# Patient Record
Sex: Male | Born: 1965 | Race: Black or African American | Hispanic: No | Marital: Single | Smoking: Never smoker
Health system: Southern US, Community
[De-identification: ages and names within clinical notes are randomized; demographics above are authoritative.]

## PROBLEM LIST (undated history)

## (undated) DIAGNOSIS — M5136 Other intervertebral disc degeneration, lumbar region: Secondary | ICD-10-CM

## (undated) DIAGNOSIS — I10 Essential (primary) hypertension: Secondary | ICD-10-CM

## (undated) DIAGNOSIS — Q613 Polycystic kidney, unspecified: Secondary | ICD-10-CM

## (undated) DIAGNOSIS — M51369 Other intervertebral disc degeneration, lumbar region without mention of lumbar back pain or lower extremity pain: Secondary | ICD-10-CM

## (undated) HISTORY — PX: NEPHRECTOMY TRANSPLANTED ORGAN: SUR880

---

## 1998-11-21 ENCOUNTER — Encounter: Payer: Self-pay | Admitting: Urology

## 1998-11-21 ENCOUNTER — Ambulatory Visit (HOSPITAL_COMMUNITY): Admission: RE | Admit: 1998-11-21 | Discharge: 1998-11-21 | Payer: Self-pay | Admitting: Urology

## 2000-06-17 ENCOUNTER — Emergency Department (HOSPITAL_COMMUNITY): Admission: EM | Admit: 2000-06-17 | Discharge: 2000-06-18 | Payer: Self-pay | Admitting: Emergency Medicine

## 2001-08-10 ENCOUNTER — Emergency Department (HOSPITAL_COMMUNITY): Admission: EM | Admit: 2001-08-10 | Discharge: 2001-08-11 | Payer: Self-pay | Admitting: *Deleted

## 2001-08-11 ENCOUNTER — Encounter: Payer: Self-pay | Admitting: *Deleted

## 2001-08-12 ENCOUNTER — Emergency Department (HOSPITAL_COMMUNITY): Admission: EM | Admit: 2001-08-12 | Discharge: 2001-08-12 | Payer: Self-pay | Admitting: Emergency Medicine

## 2001-08-14 ENCOUNTER — Encounter: Payer: Self-pay | Admitting: Emergency Medicine

## 2001-08-14 ENCOUNTER — Inpatient Hospital Stay (HOSPITAL_COMMUNITY): Admission: EM | Admit: 2001-08-14 | Discharge: 2001-08-18 | Payer: Self-pay | Admitting: Emergency Medicine

## 2001-08-15 ENCOUNTER — Encounter: Payer: Self-pay | Admitting: Cardiology

## 2001-08-15 ENCOUNTER — Encounter: Payer: Self-pay | Admitting: Family Medicine

## 2001-08-16 ENCOUNTER — Encounter: Payer: Self-pay | Admitting: Family Medicine

## 2001-08-18 ENCOUNTER — Encounter: Payer: Self-pay | Admitting: Family Medicine

## 2001-08-23 ENCOUNTER — Encounter: Admission: RE | Admit: 2001-08-23 | Discharge: 2001-08-23 | Payer: Self-pay | Admitting: Family Medicine

## 2001-08-23 ENCOUNTER — Encounter (HOSPITAL_COMMUNITY): Admission: RE | Admit: 2001-08-23 | Discharge: 2001-09-22 | Payer: Self-pay | Admitting: Nephrology

## 2002-01-10 ENCOUNTER — Encounter: Payer: Self-pay | Admitting: Vascular Surgery

## 2002-01-16 ENCOUNTER — Ambulatory Visit (HOSPITAL_COMMUNITY): Admission: RE | Admit: 2002-01-16 | Discharge: 2002-01-16 | Payer: Self-pay | Admitting: Vascular Surgery

## 2002-07-21 ENCOUNTER — Emergency Department (HOSPITAL_COMMUNITY): Admission: EM | Admit: 2002-07-21 | Discharge: 2002-07-21 | Payer: Self-pay | Admitting: Emergency Medicine

## 2002-07-21 ENCOUNTER — Encounter: Payer: Self-pay | Admitting: Emergency Medicine

## 2003-01-21 ENCOUNTER — Emergency Department (HOSPITAL_COMMUNITY): Admission: EM | Admit: 2003-01-21 | Discharge: 2003-01-21 | Payer: Self-pay | Admitting: Emergency Medicine

## 2003-06-13 ENCOUNTER — Ambulatory Visit (HOSPITAL_COMMUNITY): Admission: RE | Admit: 2003-06-13 | Discharge: 2003-06-13 | Payer: Self-pay | Admitting: Nephrology

## 2003-06-15 ENCOUNTER — Ambulatory Visit (HOSPITAL_COMMUNITY): Admission: RE | Admit: 2003-06-15 | Discharge: 2003-06-15 | Payer: Self-pay | Admitting: Nephrology

## 2003-09-08 ENCOUNTER — Inpatient Hospital Stay (HOSPITAL_COMMUNITY): Admission: EM | Admit: 2003-09-08 | Discharge: 2003-09-10 | Payer: Self-pay | Admitting: Emergency Medicine

## 2003-09-14 ENCOUNTER — Emergency Department (HOSPITAL_COMMUNITY): Admission: EM | Admit: 2003-09-14 | Discharge: 2003-09-15 | Payer: Self-pay | Admitting: *Deleted

## 2003-09-21 ENCOUNTER — Inpatient Hospital Stay (HOSPITAL_COMMUNITY): Admission: EM | Admit: 2003-09-21 | Discharge: 2003-09-24 | Payer: Self-pay | Admitting: Emergency Medicine

## 2003-09-24 ENCOUNTER — Encounter: Payer: Self-pay | Admitting: Internal Medicine

## 2003-10-04 ENCOUNTER — Ambulatory Visit (HOSPITAL_COMMUNITY): Admission: RE | Admit: 2003-10-04 | Discharge: 2003-10-04 | Payer: Self-pay | Admitting: Nephrology

## 2003-10-10 ENCOUNTER — Ambulatory Visit (HOSPITAL_COMMUNITY): Admission: RE | Admit: 2003-10-10 | Discharge: 2003-10-10 | Payer: Self-pay | Admitting: Nephrology

## 2003-10-13 ENCOUNTER — Ambulatory Visit (HOSPITAL_COMMUNITY): Admission: RE | Admit: 2003-10-13 | Discharge: 2003-10-13 | Payer: Self-pay | Admitting: Nephrology

## 2003-10-13 ENCOUNTER — Emergency Department (HOSPITAL_COMMUNITY): Admission: EM | Admit: 2003-10-13 | Discharge: 2003-10-14 | Payer: Self-pay | Admitting: Emergency Medicine

## 2004-01-28 ENCOUNTER — Ambulatory Visit (HOSPITAL_COMMUNITY): Admission: RE | Admit: 2004-01-28 | Discharge: 2004-01-28 | Payer: Self-pay | Admitting: Nephrology

## 2004-03-28 ENCOUNTER — Emergency Department (HOSPITAL_COMMUNITY): Admission: EM | Admit: 2004-03-28 | Discharge: 2004-03-28 | Payer: Self-pay | Admitting: Emergency Medicine

## 2004-03-29 ENCOUNTER — Emergency Department (HOSPITAL_COMMUNITY): Admission: EM | Admit: 2004-03-29 | Discharge: 2004-03-29 | Payer: Self-pay | Admitting: *Deleted

## 2004-04-04 ENCOUNTER — Ambulatory Visit (HOSPITAL_COMMUNITY): Admission: RE | Admit: 2004-04-04 | Discharge: 2004-04-04 | Payer: Self-pay | Admitting: Nephrology

## 2004-04-08 ENCOUNTER — Encounter (HOSPITAL_COMMUNITY): Admission: RE | Admit: 2004-04-08 | Discharge: 2004-05-08 | Payer: Self-pay | Admitting: Nephrology

## 2004-04-09 ENCOUNTER — Ambulatory Visit (HOSPITAL_COMMUNITY): Payer: Self-pay | Admitting: Pulmonary Disease

## 2004-06-18 ENCOUNTER — Ambulatory Visit (HOSPITAL_COMMUNITY): Admission: RE | Admit: 2004-06-18 | Discharge: 2004-06-18 | Payer: Self-pay | Admitting: Nephrology

## 2004-08-06 ENCOUNTER — Encounter: Admission: RE | Admit: 2004-08-06 | Discharge: 2004-08-06 | Payer: Self-pay | Admitting: Nephrology

## 2004-09-28 ENCOUNTER — Emergency Department (HOSPITAL_COMMUNITY): Admission: EM | Admit: 2004-09-28 | Discharge: 2004-09-28 | Payer: Self-pay | Admitting: Emergency Medicine

## 2004-10-01 ENCOUNTER — Ambulatory Visit (HOSPITAL_COMMUNITY): Admission: RE | Admit: 2004-10-01 | Discharge: 2004-10-01 | Payer: Self-pay | Admitting: Nephrology

## 2004-10-20 ENCOUNTER — Inpatient Hospital Stay (HOSPITAL_COMMUNITY): Admission: EM | Admit: 2004-10-20 | Discharge: 2004-10-29 | Payer: Self-pay | Admitting: Emergency Medicine

## 2005-01-12 ENCOUNTER — Ambulatory Visit (HOSPITAL_COMMUNITY): Admission: RE | Admit: 2005-01-12 | Discharge: 2005-01-12 | Payer: Self-pay | Admitting: Nephrology

## 2005-07-20 IMAGING — XA IR VENTRICULOPERITONEALSHUNT EVAL/INJ
1 series · 13 of 24 positions shown · non-contrast
Comparison: none

CLINICAL DATA: Increased venous pressures, poor clearance 

LEFT UPPER ARM AV SHUNTOGRAM:
Complications: None

[Series 1: run · 13 of 31 slices shown]
[im 1/31]
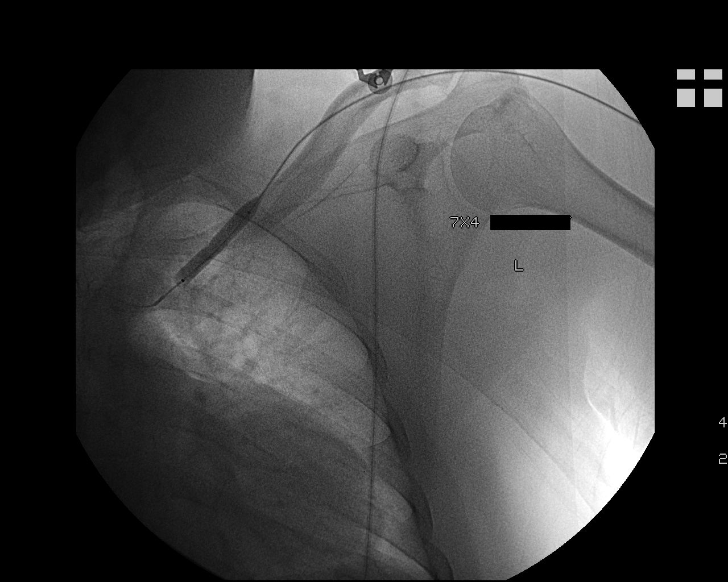
[im 3/31]
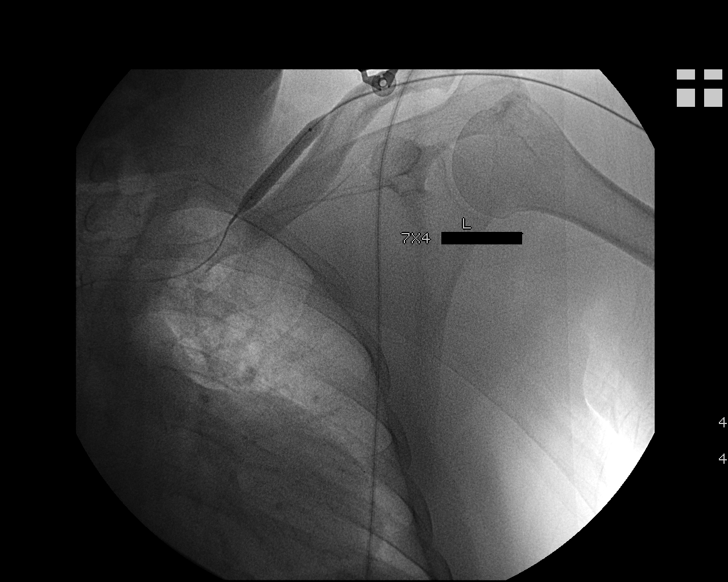
[im 6/31]
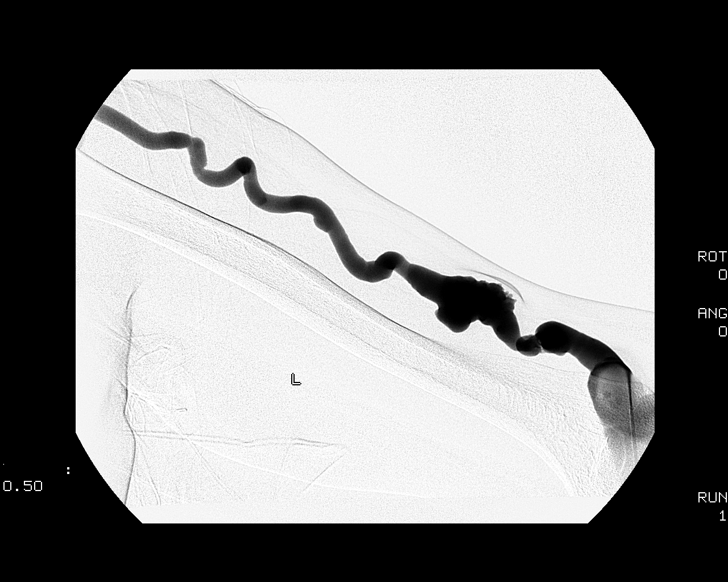
[im 8/31]
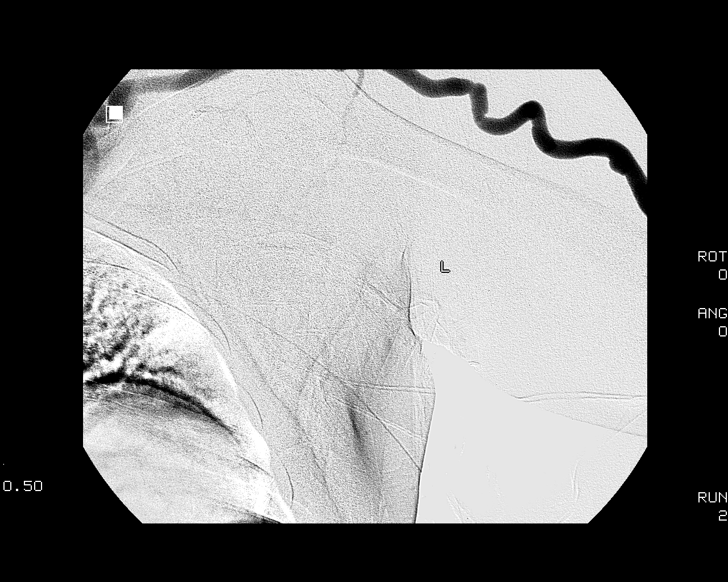
[im 11/31]
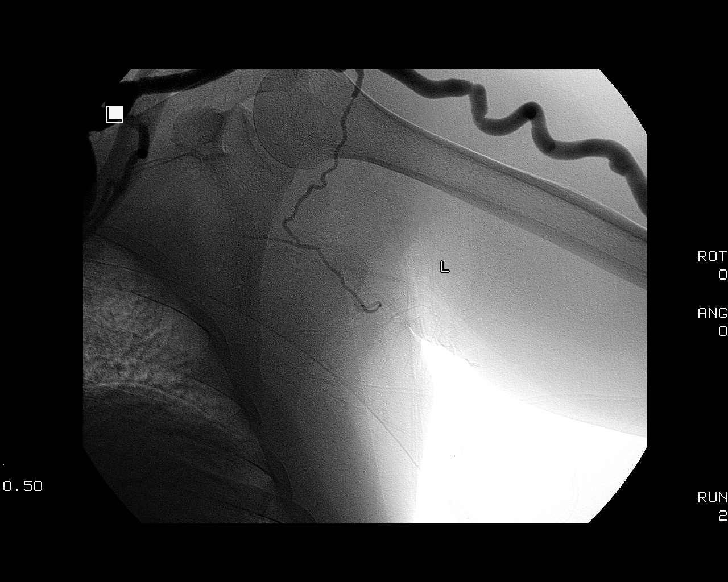
[im 14/31]
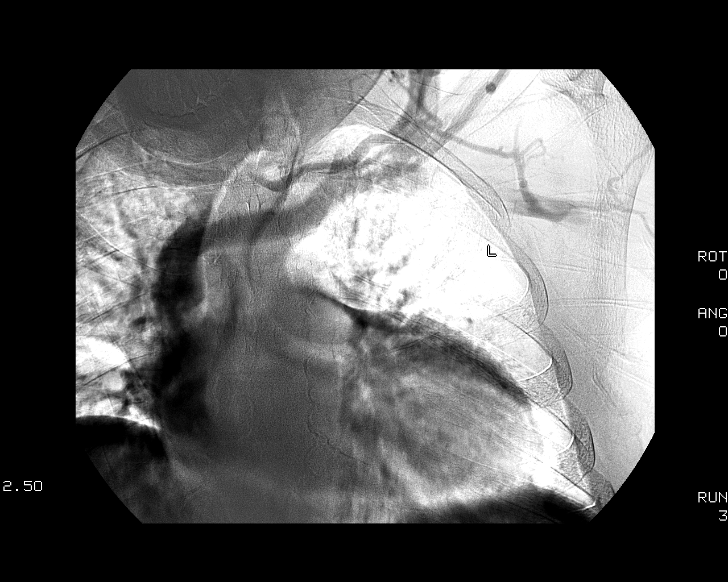
[im 16/31]
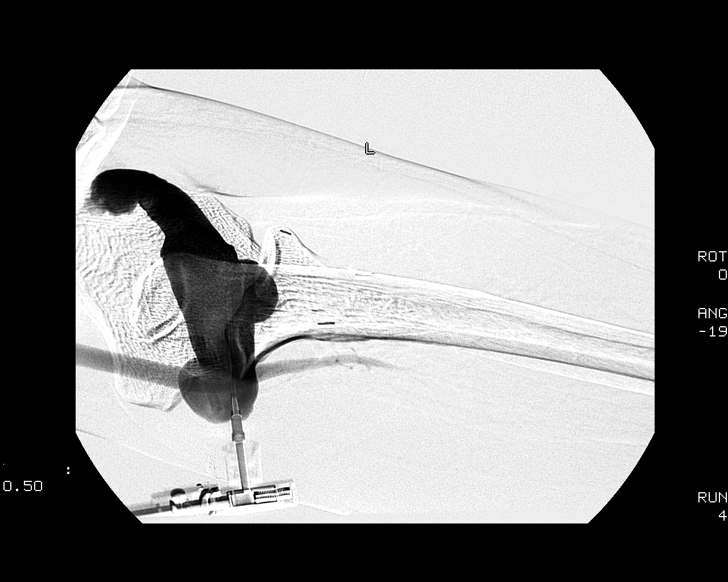
[im 17/31]
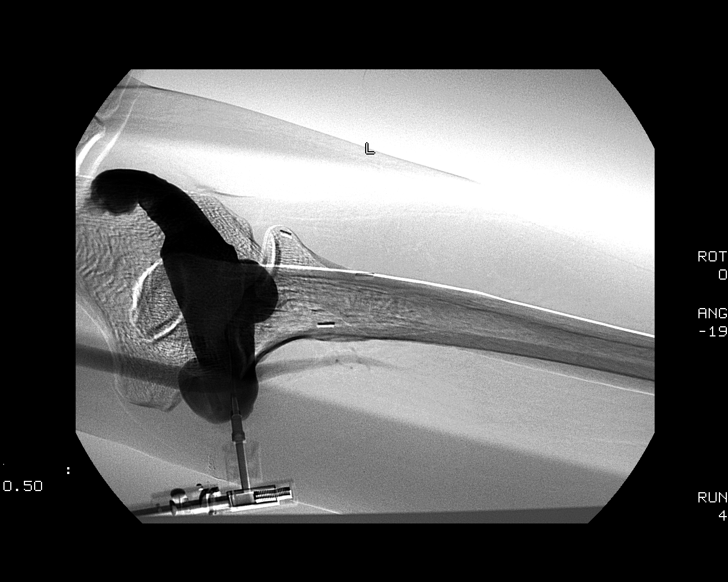
[im 20/31]
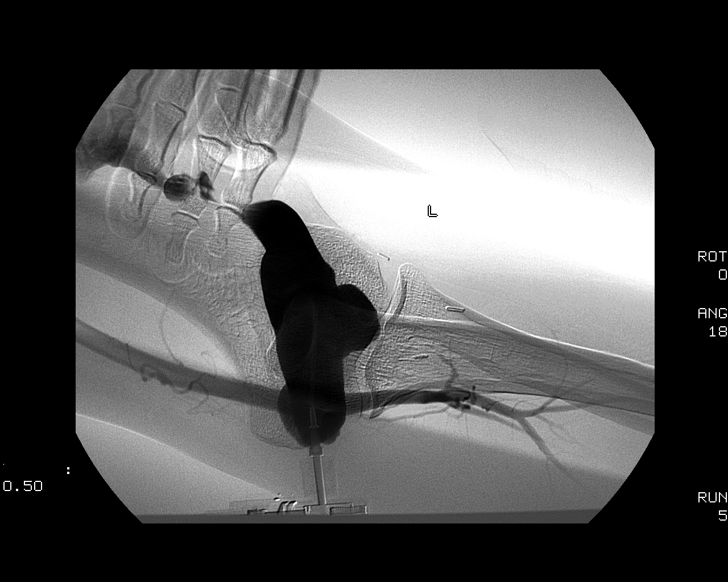
[im 23/31]
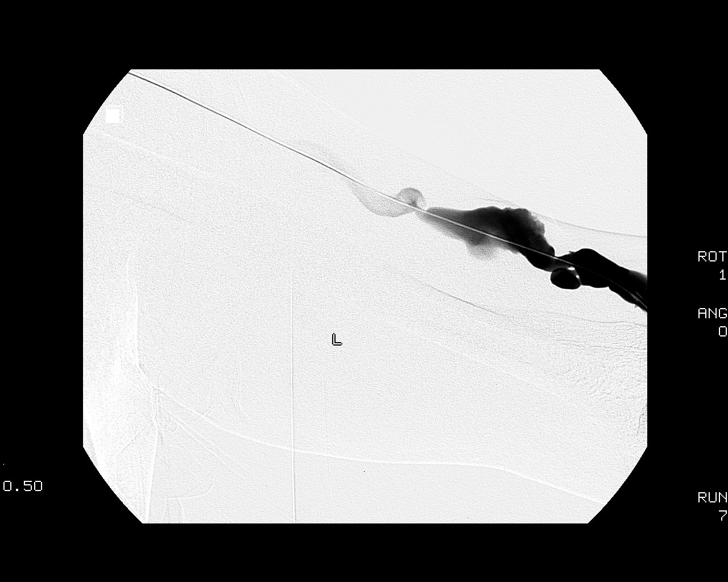
[im 25/31]
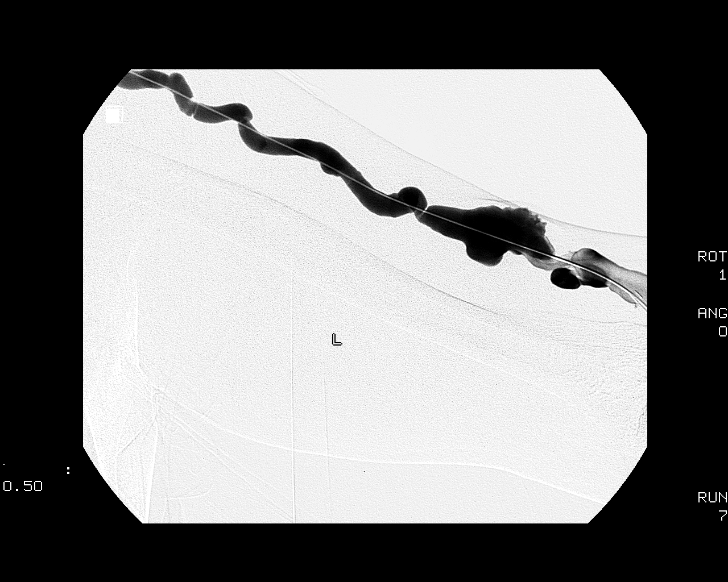
[im 28/31]
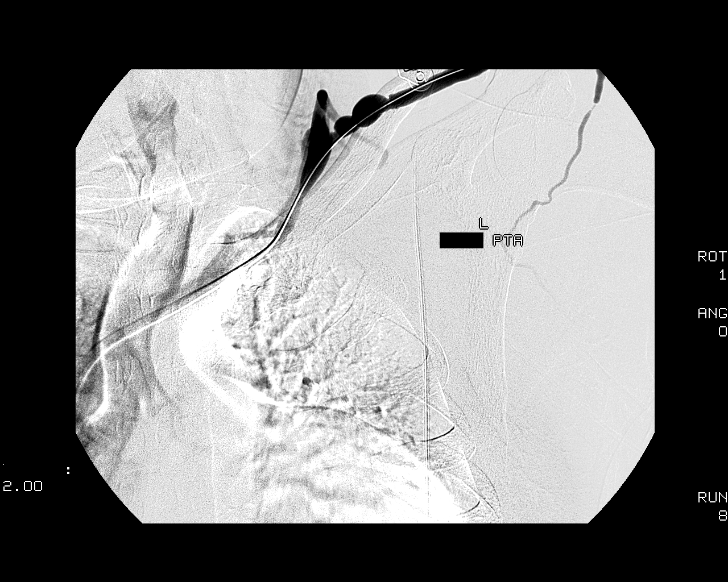
[im 31/31]
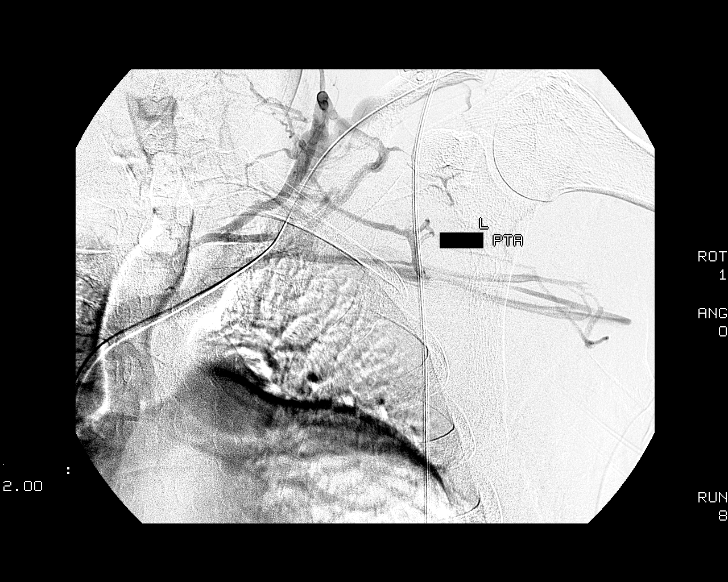

[13 of 24 positions shown; findings below may reference images not displayed]

FINDINGS: Written informed consent was obtained from the patient for the
procedure. The patient was placed supine on the angiography table and the left
upper arm was prepped and draped in sterile fashion. Access to the left upper
arm fistula was obtained with an 18 gauge angiocatheter. AV shuntogram was
performed. This shows aneurysmal segments of the proximal draining vein which is
the cephalic vein. Within the central left cephalic vein, there are 2 areas of
mild to moderate narrowing. The arterial anastomosis is patent.
IMPRESSION: 2 areas of moderate narrowing within the central left cephalic vein.

Large aneurysmal segments of the proximal draining vein/cephalic vein. 

VENOUS ANGIOPLASTY:
FINDINGS: The 18 gauge angiocath was exchanged over a guidewire a 6 French
sheath. The 2 areas of narrowing within the central left cephalic vein were
dilated with a 7 mm x 4 cm high pressure balloon. Repeat shuntogram shows good
response in these areas. In addition, there was difficulty passing a wire and
catheter through the proximal aspect of the fistula between the aneurysmal
segments. Therefore there was concern for a stenosis in this area. This was also
dilated with a 7 mm high pressure balloon. Repeat shuntogram shows good response
with no residual stenosis.
IMPRESSION: Successful balloon dilatation of the above venous stenosis with good response.

## 2006-02-13 IMAGING — XA IR VENTRICULOPERITONEALSHUNT EVAL/INJ
1 series · 15 of 24 positions shown · non-contrast
Comparison: 06/18/04.

CLINICAL DATA: History of endstage renal disease.  The patient is dialyzed through a native fistula of the left antecubital fossa with cephalic outflow. This has been previously studied on 06/18/04, at which time venous angioplasty was performed of the central cephalic vein.  He now presents with increased venous pressures and decreased flow rates during dialysis. 
LEFT AV FISTULOGRAM - 01/12/05:

[Series 1: run · 15 of 50 slices shown]
[im 1/50]
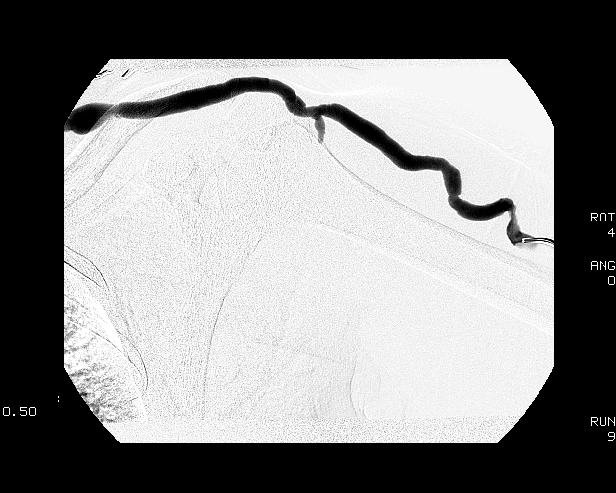
[im 5/50]
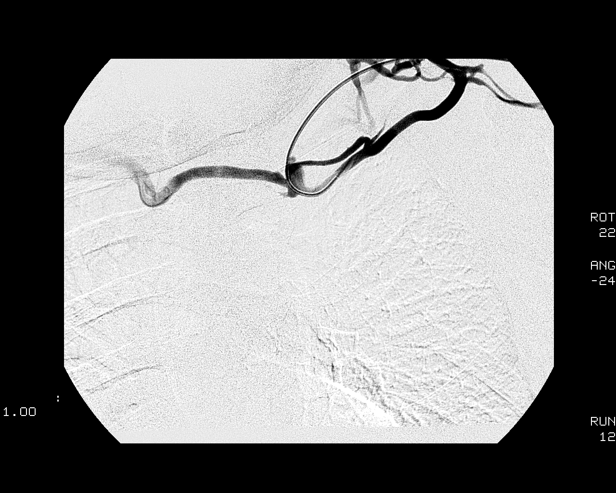
[im 9/50]
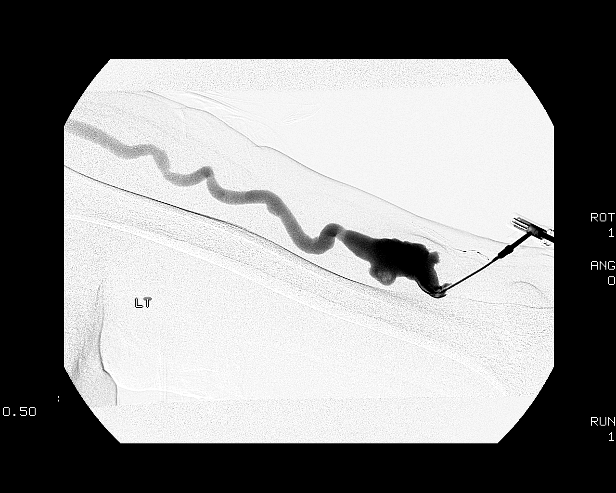
[im 11/50]
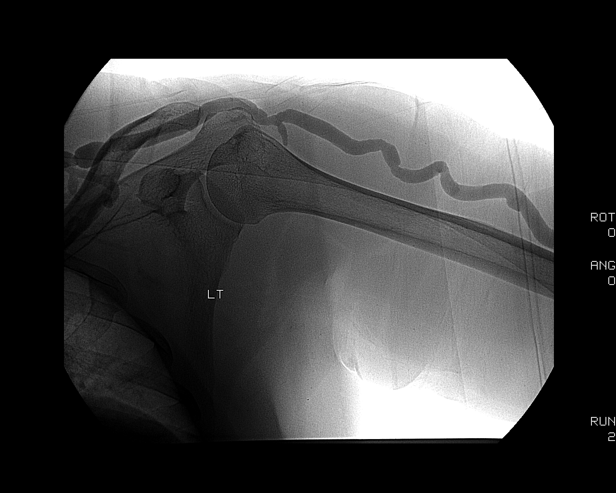
[im 15/50]
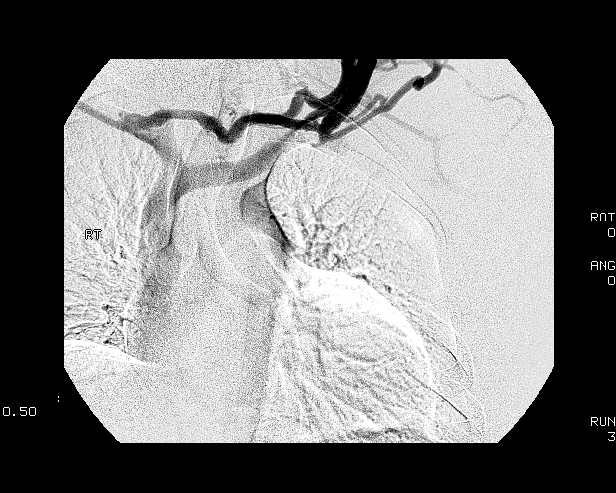
[im 18/50]
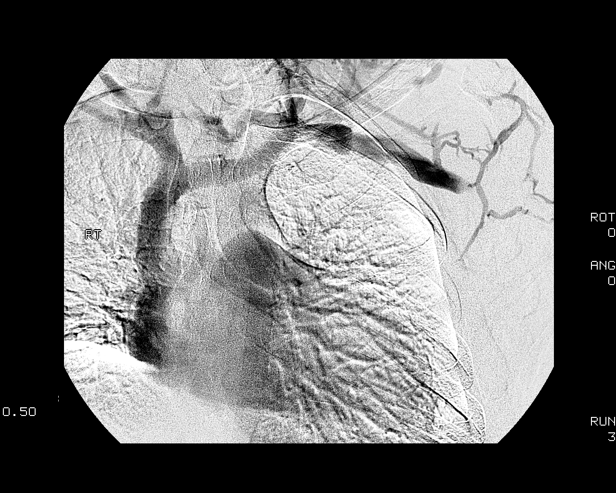
[im 22/50]
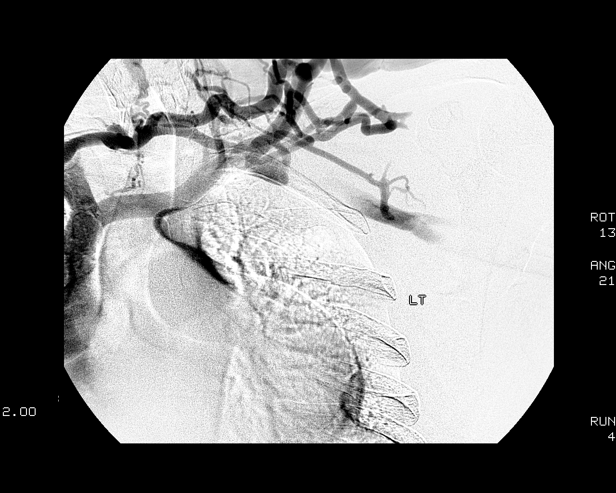
[im 26/50]
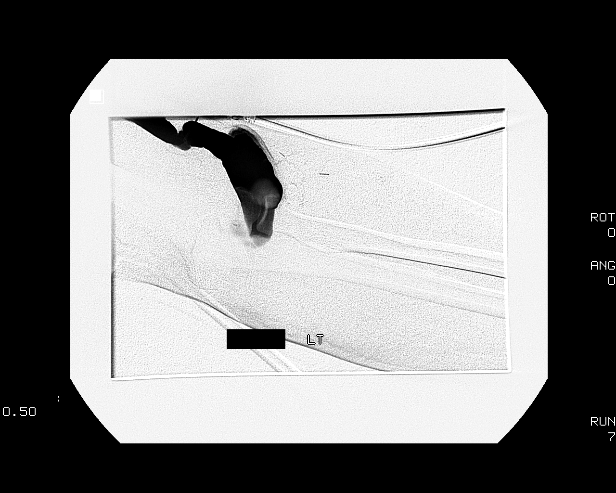
[im 28/50]
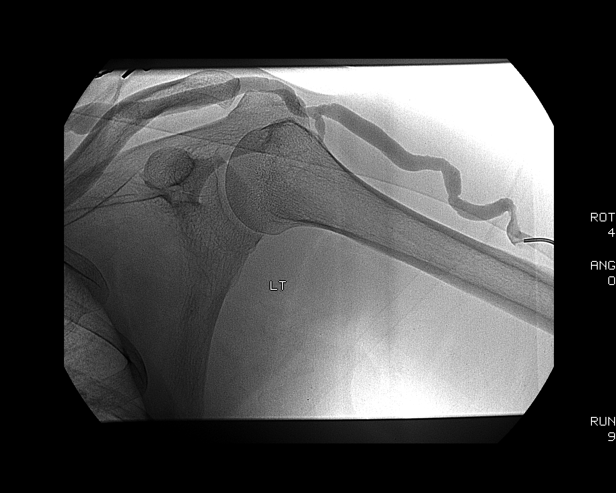
[im 32/50]
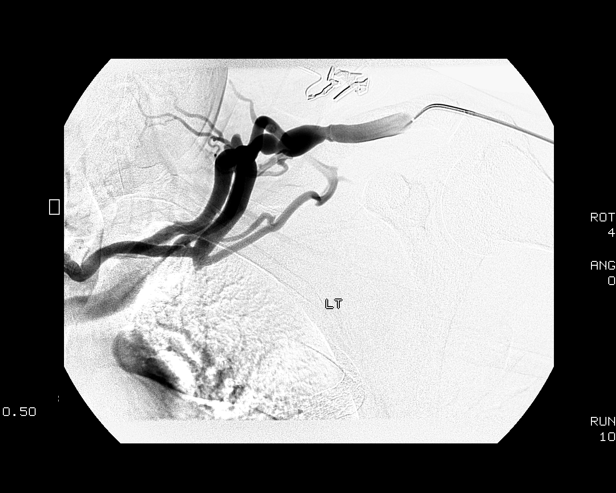
[im 35/50]
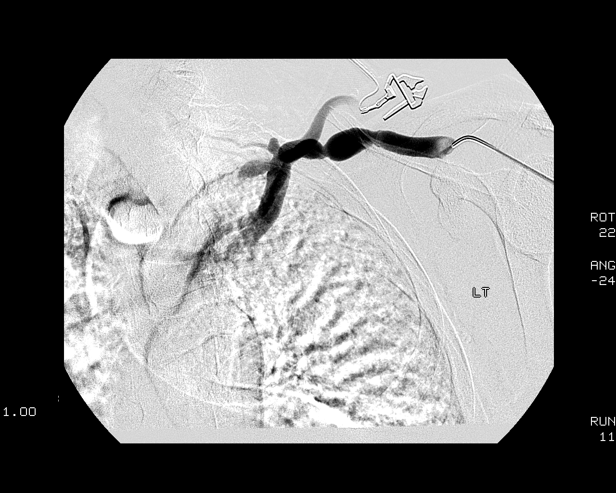
[im 39/50]
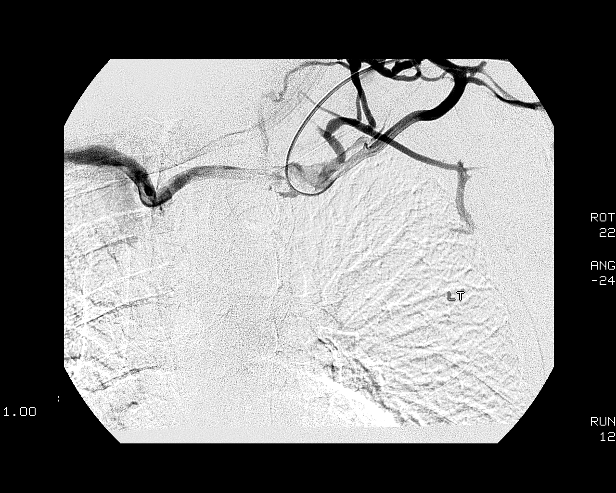
[im 43/50]
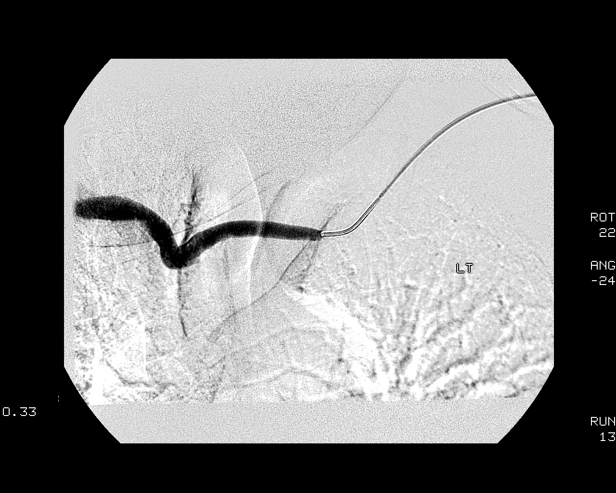
[im 45/50]
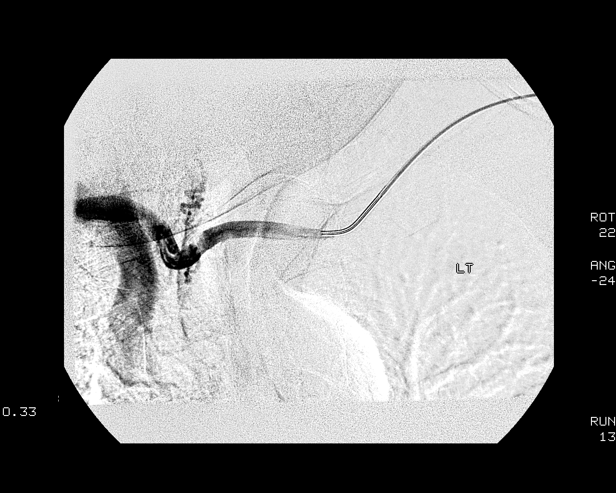
[im 50/50]
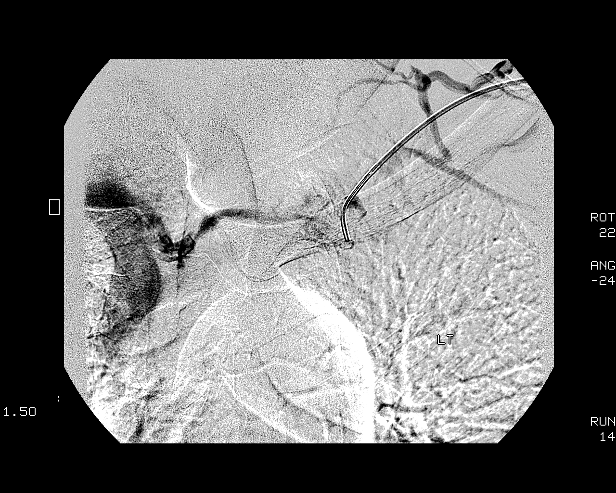

[15 of 24 positions shown; findings below may reference images not displayed]

Contrast:  100 cc Omnipaque 300.
Fluoro time:  6.7 minutes.
Prior to the procedure, informed consent was obtained.  An 18-gauge Angiocath was introduced into the cephalic venous outflow and fistulogram performed with full visualization of venous outflow into the chest.  Reflux was performed to evaluate the arterial anastomosis with temporary inflation of a blood pressure cuff in the upper arm. 
After the diagnostic procedure, the skin surrounding the Angiocath was sterilely prepped and draped.  Local anesthesia was provided with 1% lidocaine.  A 7-French vascular sheath was placed in anticipation of potential subclavian vein intervention.  A 5-French diagnostic catheter was then advanced into the cephalic vein and additional cephalic angiography performed.  The catheter was then further advanced over a wire into the chest at the juncture of the cephalic vein with the subclavian vein.  Additional catheter injections were performed.  The catheter was also advanced into the subclavian vein and further angiograms done.  The level of venous occlusion was then probed with various guidewires.  At the end of the procedure, the sheath was removed and hemostasis obtained with application of a 0-silk pursestring suture.
FINDINGS: There is stable dilatation of the cephalic vein immediately beyond anastomosis with the brachial artery at the left antecubital fossa.  The rest of the cephalic vein shows stable maturity and tortuous dilatation extending up into the shoulder.  No progressive areas of stenosis are seen up to this point. 
In the chest, it is evident on the initial contrast injections that a venous occlusion and/or high-grade stenosis is present with prominent filling of collateral veins at the level of the shoulder, base of the left neck, and prominent transthoracic collateral that empties directly into the contralateral right brachiocephalic vein reconstituting flow in the central veins and including reflux into the left brachiocephalic vein and subclavian vein. 
Additional catheter injections were performed in order to define venous outflow.  It then became evident that there was a complete occlusion of the subclavian vein just beyond the juncture of the cephalic vein with the lateral subclavian vein.  A wire could be advanced into the more lateral aspect of the subclavian vein and into the axillary vein.  Contrast injections then show filling of only collateral venous outflow with small nub of contrast filling present in a beak configuration, consistent with the level of subclavian venous occlusion.  This level was probed with hydrophilic guidewires and could not be crossed.  After probing with the guidewire, contrast injection through a catheter abutting the venous occlusion does show a small amount of focal extravasation.  This was not symptomatic to the patient, and the patient remained hemodynamically stable.  The vein cannot be recanalized.  The central aspect of the vein visible on the initial fistulogram represented reflux from the contralateral side and not antegrade flow.
IMPRESSION: Progression of venous outflow disease with complete occlusion of the subclavian vein just distal to the juncture of the cephalic vein with the lateral subclavian vein.  Very prominent collaterals have developed with collateral outflow present across the chest via a transthoracic vessel that empties directly into the right brachiocephalic vein.  The level of occlusion was probed in an attempt to recanalize the venous segment, but this could not be successfully recanalized.  Given the appearance, consideration should be given to future dialysis access in the right arm.  The left jugular vein does appear open on the study with reflux present into the lower jugular vein.  This should allow placement of a catheter via the left jugular vein if the patient is in need of a tunneled catheter prior to or immediately following placement of a new graft or fistula in the right arm.

## 2006-04-05 ENCOUNTER — Ambulatory Visit: Payer: Self-pay | Admitting: Vascular Surgery

## 2006-04-08 ENCOUNTER — Ambulatory Visit: Payer: Self-pay | Admitting: Vascular Surgery

## 2006-04-08 ENCOUNTER — Ambulatory Visit (HOSPITAL_COMMUNITY): Admission: RE | Admit: 2006-04-08 | Discharge: 2006-04-08 | Payer: Self-pay | Admitting: Vascular Surgery

## 2006-05-03 ENCOUNTER — Ambulatory Visit: Payer: Self-pay | Admitting: Vascular Surgery

## 2006-05-04 ENCOUNTER — Emergency Department (HOSPITAL_COMMUNITY): Admission: EM | Admit: 2006-05-04 | Discharge: 2006-05-04 | Payer: Self-pay | Admitting: Emergency Medicine

## 2006-08-07 ENCOUNTER — Emergency Department (HOSPITAL_COMMUNITY): Admission: EM | Admit: 2006-08-07 | Discharge: 2006-08-07 | Payer: Self-pay | Admitting: Emergency Medicine

## 2006-11-01 ENCOUNTER — Emergency Department (HOSPITAL_COMMUNITY): Admission: EM | Admit: 2006-11-01 | Discharge: 2006-11-01 | Payer: Self-pay | Admitting: Emergency Medicine

## 2007-08-16 ENCOUNTER — Emergency Department (HOSPITAL_COMMUNITY): Admission: EM | Admit: 2007-08-16 | Discharge: 2007-08-16 | Payer: Self-pay | Admitting: Emergency Medicine

## 2007-10-03 ENCOUNTER — Emergency Department (HOSPITAL_COMMUNITY): Admission: EM | Admit: 2007-10-03 | Discharge: 2007-10-03 | Payer: Self-pay | Admitting: Emergency Medicine

## 2007-10-30 ENCOUNTER — Emergency Department (HOSPITAL_COMMUNITY): Admission: EM | Admit: 2007-10-30 | Discharge: 2007-10-30 | Payer: Self-pay | Admitting: Emergency Medicine

## 2007-11-20 ENCOUNTER — Emergency Department (HOSPITAL_COMMUNITY): Admission: EM | Admit: 2007-11-20 | Discharge: 2007-11-20 | Payer: Self-pay | Admitting: Emergency Medicine

## 2008-02-07 ENCOUNTER — Emergency Department (HOSPITAL_COMMUNITY): Admission: EM | Admit: 2008-02-07 | Discharge: 2008-02-07 | Payer: Self-pay | Admitting: Emergency Medicine
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
## 2008-10-09 ENCOUNTER — Emergency Department (HOSPITAL_COMMUNITY): Admission: EM | Admit: 2008-10-09 | Discharge: 2008-10-09 | Payer: Self-pay | Admitting: Emergency Medicine

## 2008-12-10 ENCOUNTER — Emergency Department (HOSPITAL_COMMUNITY): Admission: EM | Admit: 2008-12-10 | Discharge: 2008-12-10 | Payer: Self-pay | Admitting: Emergency Medicine

## 2009-05-20 ENCOUNTER — Emergency Department (HOSPITAL_COMMUNITY): Admission: EM | Admit: 2009-05-20 | Discharge: 2009-05-20 | Payer: Self-pay | Admitting: Emergency Medicine

## 2009-07-16 ENCOUNTER — Encounter (HOSPITAL_COMMUNITY): Admission: RE | Admit: 2009-07-16 | Discharge: 2009-08-15 | Payer: Self-pay | Admitting: Family Medicine

## 2009-07-24 ENCOUNTER — Emergency Department (HOSPITAL_COMMUNITY)
Admission: EM | Admit: 2009-07-24 | Discharge: 2009-07-24 | Payer: Self-pay | Source: Home / Self Care | Admitting: Emergency Medicine

## 2010-03-09 ENCOUNTER — Inpatient Hospital Stay (HOSPITAL_COMMUNITY)
Admission: EM | Admit: 2010-03-09 | Discharge: 2010-03-11 | Payer: Self-pay | Source: Home / Self Care | Attending: General Surgery | Admitting: General Surgery

## 2010-03-09 ENCOUNTER — Encounter: Payer: Self-pay | Admitting: Nephrology

## 2010-03-11 LAB — DIFFERENTIAL
Basophils Relative: 0 % (ref 0–1)
Eosinophils Absolute: 0 10*3/uL (ref 0.0–0.7)
Eosinophils Relative: 0 % (ref 0–5)
Lymphs Abs: 1.1 10*3/uL (ref 0.7–4.0)
Monocytes Relative: 9 % (ref 3–12)
Neutro Abs: 4 10*3/uL (ref 1.7–7.7)
Neutrophils Relative %: 71 % (ref 43–77)

## 2010-03-11 LAB — CBC
HCT: 41.6 % (ref 39.0–52.0)
Hemoglobin: 13.6 g/dL (ref 13.0–17.0)
Hemoglobin: 13.2 g/dL (ref 13.0–17.0)
MCH: 25.9 pg — ABNORMAL LOW (ref 26.0–34.0)
MCH: 26.1 pg (ref 26.0–34.0)
MCV: 79.2 fL (ref 78.0–100.0)
MCV: 79.8 fL (ref 78.0–100.0)
Platelets: 193 10*3/uL (ref 150–400)
RBC: 5.25 MIL/uL (ref 4.22–5.81)
RBC: 5.05 MIL/uL (ref 4.22–5.81)
RDW: 14 % (ref 11.5–15.5)

## 2010-03-11 LAB — COMPREHENSIVE METABOLIC PANEL
ALT: 86 U/L — ABNORMAL HIGH (ref 0–53)
AST: 164 U/L — ABNORMAL HIGH (ref 0–37)
Albumin: 4.1 g/dL (ref 3.5–5.2)
BUN: 13 mg/dL (ref 6–23)
BUN: 13 mg/dL (ref 6–23)
CO2: 22 mEq/L (ref 19–32)
Chloride: 100 mEq/L (ref 96–112)
Chloride: 105 mEq/L (ref 96–112)
Creatinine, Ser: 1.24 mg/dL (ref 0.4–1.5)
Creatinine, Ser: 1.09 mg/dL (ref 0.4–1.5)
GFR calc non Af Amer: >60 mL/min (ref >60)
Glucose, Bld: 150 mg/dL — ABNORMAL HIGH (ref 70–99)
Potassium: 4 mEq/L (ref 3.5–5.1)
Total Bilirubin: 1.8 mg/dL — ABNORMAL HIGH (ref 0.3–1.2)

## 2010-03-11 LAB — URINALYSIS, ROUTINE W REFLEX MICROSCOPIC
Leukocytes, UA: NEGATIVE
Nitrite: NEGATIVE
Urine Glucose, Fasting: 250 mg/dL — AB
Urobilinogen, UA: 4 mg/dL — ABNORMAL HIGH (ref 0.0–1.0)

## 2010-03-11 LAB — LIPASE, BLOOD: Lipase: 23 U/L (ref 11–59)

## 2010-03-11 LAB — GLUCOSE, CAPILLARY: Glucose-Capillary: 154 mg/dL — ABNORMAL HIGH (ref 70–99)

## 2010-03-11 LAB — URINE MICROSCOPIC-ADD ON

## 2010-03-11 LAB — BILIRUBIN, DIRECT: Bilirubin, Direct: 0.8 mg/dL — ABNORMAL HIGH (ref 0.0–0.3)

## 2010-03-12 LAB — DIFFERENTIAL
Eosinophils Absolute: 0.1 10*3/uL (ref 0.0–0.7)
Eosinophils Relative: 1 % (ref 0–5)
Lymphs Abs: 1.2 10*3/uL (ref 0.7–4.0)

## 2010-03-12 LAB — HEPATIC FUNCTION PANEL
ALT: 167 U/L — ABNORMAL HIGH (ref 0–53)
Alkaline Phosphatase: 216 U/L — ABNORMAL HIGH (ref 39–117)
Indirect Bilirubin: 0.8 mg/dL (ref 0.3–0.9)
Total Protein: 7.3 g/dL (ref 6.0–8.3)

## 2010-03-12 LAB — URINE CULTURE

## 2010-03-12 LAB — GLUCOSE, CAPILLARY: Glucose-Capillary: 215 mg/dL — ABNORMAL HIGH (ref 70–99)

## 2010-03-12 LAB — BASIC METABOLIC PANEL
BUN: 9 mg/dL (ref 6–23)
Chloride: 102 mEq/L (ref 96–112)
Glucose, Bld: 146 mg/dL — ABNORMAL HIGH (ref 70–99)
Potassium: 4.3 mEq/L (ref 3.5–5.1)

## 2010-03-12 LAB — CBC
MCH: 26 pg (ref 26.0–34.0)
MCHC: 32.5 g/dL (ref 30.0–36.0)
MCV: 80 fL (ref 78.0–100.0)
Platelets: 161 10*3/uL (ref 150–400)
RDW: 14.1 % (ref 11.5–15.5)
WBC: 6.6 10*3/uL (ref 4.0–10.5)

## 2010-03-12 LAB — HEPATITIS PANEL, ACUTE: Hep B C IgM: NEGATIVE

## 2010-03-19 ENCOUNTER — Encounter: Payer: Self-pay | Admitting: Emergency Medicine

## 2010-03-20 LAB — HM HIV SCREENING LAB: HM HIV Screening: NEGATIVE

## 2010-04-25 NOTE — H&P (Signed)
[name], [name]               ACCOUNT NO.:  [id number]  MEDICAL RECORD NO.:  [id number]          PATIENT TYPE:  INP  LOCATION:  A202                          FACILITY:  [facility]  PHYSICIAN:  Tilford Pillar, MD      DATE OF BIRTH:  1965-10-26  DATE OF ADMISSION:  03/09/2010 DATE OF DISCHARGE:  01/24/2012LH                             HISTORY & PHYSICAL   CHIEF COMPLAINT:  Epigastric abdominal pain.  HISTORY OF PRESENT ILLNESS:  The patient is a 45 year old male who presented to emergency department with increasing epigastric abdominal pain.  He states that this has been constant over the day, although the pain had started over a month ago and no significant change in location and radiation.  He has had no nausea, no emesis, no fever or chills, no jaundice, no change in bowel movements, no melena, no hematochezia.  PAST MEDICAL HISTORY:  Hypertension, diabetes mellitus, hyperlipidemia, and history of kidney transplantation for polycystic kidney disease.  SURGERIES:  Kidney transplant as mentioned above.  MEDICATIONS:  NovoLog, Viagra, simvastatin, Prograf, prednisone, Onglyza, omeprazole, __________, metoclopramide, enalapril, atenolol, amlodipine.  ALLERGIES:  VANCOMYCIN.  SOCIAL HISTORY:  No tobacco, no alcohol.  No recreational drug abuse.  FAMILY HISTORY:  Noncontributory.  REVIEW OF SYSTEMS:  Unremarkable in all systems other than gastrointestinal as per HPI.  PHYSICAL EXAMINATION:  GENERAL:  The patient is afebrile. VITAL SIGNS:  Pulse 83, respirations 20, blood pressure 139/95. GENERAL:  The patient is not in any acute distress.  He is alert and oriented x3. HEENT:  Scalp, no deformities or masses.  Eyes:  Pupils are equal, round, and reactive.  Extraocular movements are intact.  No scleral icterus.  No conjunctival pallor is noted.  Oral mucosa is pink.  Normal occlusion. NECK:  Trachea is midline.  No cervical lymphadenopathy. PULMONARY:  Unlabored respiration.   He is clear to auscultation bilaterally. CARDIOVASCULAR:  Regular rate and rhythm without murmurs or gallops.  He has 2+ radial and femoral pulses. ABDOMEN:  Positive bowel sounds.  Abdomen is soft.  Mild epigastric abdominal pain.  No diffuse peritoneal signs.  No masses or hernias. EXTREMITIES:  Warm and dry.  PERTINENT LABORATORY AND RADIOGRAPHIC STUDIES:  CT of the abdomen and pelvis does demonstrate no evidence of any free air or free fluid within the peritoneal cavity, although there is a small air pocket at the gallbladder lumen.  There is no noted gas within the gallbladder.  There is no pericholecystic fluid or gallbladder wall thickening.  CBC:  White blood cell count 9.8, hemoglobin 13.6, hematocrit 41.6, platelets 193,000.  Complete metabolic panel is within normal limits.  ASSESSMENT:  Abdominal pain.  PLAN:  At this point due to the patient's relatively benign exam findings as well as the limited suspicion that there is a progressive problem occurring with the gallbladder, he will be brought to the hospital and monitored with continued IV fluids, continued on n.p.o. status and continue monitoring his CBC and blood work.  He said he demonstrates progression signs consistent with acute cholecystitis.  We will plan to proceed, however at this time the etiology of acute cholecystitis  is extremely low.  Additionally, based on his workup, there is a low suspicion of cholangitis and at this point I will continue to monitor.  It is possible that due to the patient's long course that this has actually been resolution of a infectious process that occurred several weeks ago and we are seeing just remnants of the episode.  At this time, the patient does not appear __________ and at this point we will reserve going to the operating room should he worsen. This was discussed at length with the patient.  He does understand the risks and benefits and we will admit him for continued  monitoring     Tilford Pillar, MD     BZ/MEDQ  D:  04/24/2010  T:  04/25/2010  Job:  [id number]  Electronically Signed by Tilford Pillar MD on 04/25/2010 11:23:23 AM

## 2010-04-25 NOTE — Discharge Summary (Signed)
  NAMECEDRICK, [name]               ACCOUNT NO.:  [id number]  MEDICAL RECORD NO.:  [id number]          PATIENT TYPE:  INP  LOCATION:  A202                          FACILITY:  [facility]  PHYSICIAN:  Tilford Pillar, MD      DATE OF BIRTH:  24-Jan-1966  DATE OF ADMISSION:  03/09/2010 DATE OF DISCHARGE:  01/24/2012LH                              DISCHARGE SUMMARY   ADMISSION DIAGNOSES:  Abdominal pain.  DISCHARGE DIAGNOSES: 1. Resolution of abdominal pain. 2. Hypertension. 3. Diabetes mellitus. 4. Hyperlipidemia. 5. Renal transplant secondary to polycystic kidney disease.  PROCEDURES:  None.  DISPOSITION:  Home.  BRIEF HISTORY:  Please see admission H and P for complete H and P.  The patient is a 45 year old male who presented to [facility] with approximately 3-4 weeks of epigastric abdominal pain.  He was admitted for continued monitoring management.  HOSPITAL COURSE:  The patient was admitted on March 09, 2010.  He was kept initially on an n.p.o. status, IV fluid hydration, and monitored over 24 hours with improvement of his pain.  He was started on diet and on March 11, 2010, he was tolerating a regular diet and his pain was well improved.  There was no evidence of any leukocytosis.  He has had no fever or chills.  No change in vital signs with the resolution of symptomatology.  Plans were made for discharge with close followup in my office within the next week.  DISCHARGE INSTRUCTIONS:  The patient is instructed to continue activity as tolerated.  He is to monitor his diet closely.  He is to call us if he has any problems, questions, concerns, or worsening of his pain or resumption of symptomatology and he is to see me within the next week.  DISCHARGE MEDICATIONS:  Please see the discharge medication reconciliation sheet for all discharge medications.     Tilford Pillar, MD     BZ/MEDQ  D:  04/24/2010  T:  04/25/2010  Job:  [id number]  Electronically Signed  by Tilford Pillar MD on 04/25/2010 11:23:16 AM

## 2010-04-28 ENCOUNTER — Other Ambulatory Visit (HOSPITAL_COMMUNITY): Payer: Self-pay | Admitting: Family Medicine

## 2010-04-28 DIAGNOSIS — R110 Nausea: Secondary | ICD-10-CM

## 2010-04-28 DIAGNOSIS — R1110 Vomiting, unspecified: Secondary | ICD-10-CM

## 2010-04-29 ENCOUNTER — Ambulatory Visit (HOSPITAL_COMMUNITY)
Admission: RE | Admit: 2010-04-29 | Discharge: 2010-04-29 | Disposition: A | Discharge status: Home / Self Care | Payer: Medicare Other | Source: Ambulatory Visit | Attending: Family Medicine | Admitting: Family Medicine

## 2010-04-29 DIAGNOSIS — Q613 Polycystic kidney, unspecified: Secondary | ICD-10-CM | POA: Insufficient documentation

## 2010-04-29 DIAGNOSIS — Q446 Cystic disease of liver: Secondary | ICD-10-CM | POA: Insufficient documentation

## 2010-04-29 DIAGNOSIS — R932 Abnormal findings on diagnostic imaging of liver and biliary tract: Secondary | ICD-10-CM | POA: Insufficient documentation

## 2010-04-29 DIAGNOSIS — R1110 Vomiting, unspecified: Secondary | ICD-10-CM

## 2010-04-29 DIAGNOSIS — R110 Nausea: Secondary | ICD-10-CM

## 2010-04-29 DIAGNOSIS — R112 Nausea with vomiting, unspecified: Secondary | ICD-10-CM | POA: Insufficient documentation

## 2010-04-30 ENCOUNTER — Other Ambulatory Visit (HOSPITAL_COMMUNITY): Payer: Self-pay

## 2010-05-07 LAB — CBC
HCT: 42.5 % (ref 39.0–52.0)
MCV: 81.3 fL (ref 78.0–100.0)
RBC: 5.22 MIL/uL (ref 4.22–5.81)
WBC: 5.7 10*3/uL (ref 4.0–10.5)

## 2010-05-07 LAB — DIFFERENTIAL
Basophils Relative: 0 % (ref 0–1)
Eosinophils Absolute: 0.1 10*3/uL (ref 0.0–0.7)
Eosinophils Relative: 2 % (ref 0–5)
Lymphocytes Relative: 20 % (ref 12–46)
Neutro Abs: 4.2 10*3/uL (ref 1.7–7.7)

## 2010-05-07 LAB — BASIC METABOLIC PANEL
Chloride: 96 mEq/L (ref 96–112)
Creatinine, Ser: 1.38 mg/dL (ref 0.4–1.5)
GFR calc Af Amer: >60 mL/min (ref >60)
GFR calc non Af Amer: 57 mL/min — ABNORMAL LOW (ref >60)
Potassium: 4.5 mEq/L (ref 3.5–5.1)

## 2010-05-07 LAB — URINE MICROSCOPIC-ADD ON

## 2010-05-07 LAB — URINALYSIS, ROUTINE W REFLEX MICROSCOPIC
Bilirubin Urine: NEGATIVE
Specific Gravity, Urine: 1.01 (ref 1.005–1.030)
pH: 5.5 (ref 5.0–8.0)

## 2010-05-07 LAB — GLUCOSE, CAPILLARY: Glucose-Capillary: 469 mg/dL — ABNORMAL HIGH (ref 70–99)

## 2010-05-22 LAB — GLUCOSE, CAPILLARY: Glucose-Capillary: 327 mg/dL — ABNORMAL HIGH (ref 70–99)

## 2010-05-22 LAB — RAPID STREP SCREEN (MED CTR MEBANE ONLY): Streptococcus, Group A Screen (Direct): NEGATIVE

## 2010-07-04 NOTE — Discharge Summary (Signed)
[name], [name]                           ACCOUNT NO.:  [id number]   MEDICAL RECORD NO.:  [id number]                   PATIENT TYPE:  INP   LOCATION:  5505                                 FACILITY:  [facility]   PHYSICIAN:  Maree Krabbe, M.D.             DATE OF BIRTH:  10-Oct-1965   DATE OF ADMISSION:  09/21/2003  DATE OF DISCHARGE:  09/24/2003                                 DISCHARGE SUMMARY   DISCHARGE DIAGNOSES:  1. Small pericardial effusion.  2. End-stage renal disease secondary to polycystic kidney disease on chronic     dialysis since April of 2005.  3. Hypertension.  4. Gout.  5. Secondary hyperparathyroidism.  6. Iron deficiency anemia.  7. Methicillin-sensitive Staphylococcus coagulase negative bacteremia in one     of two blood cultures since September 08, 2003.   PROCEDURE:  September 24, 2003, two-dimensional echocardiogram read by Pricilla Riffle, M.D.  Impression; left ventricular ejection fraction is approximately  50%, there appeared to be a small pericardial effusion dimensions 8 mm  (posterior).   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  He presented in the  emergency room with persistent fevers and anterior subxiphoid nonradiating  pleuritic chest pain and shortness of breath over the last one to two weeks.  He states the pain and shortness of breath are worse supine and are better  when he sits up.  He has been treated since September 08, 2003, with antibiotics  for one set of blood cultures positive for methicillin-sensitive Staph  coagulase negative.  He was changed to Augmentin on September 18, 2003, because  of the same above complaints and in retrospect noted  that the chest x-ray on  September 08, 2003, possibly showed a right lower lobe infiltrate versus  atelectasis.  The patient states he seemed to feel better while on the  antibiotic.  He was seen in the emergency room last on September 14, 2003, with  the same complaints of chest pain.  The emergency room did rule out an acute  myocardial infarction and he was discharged that evening.  He underwent a  cardiac catheterization on September 17, 2003, at [facility] for a transplant  workup.  The results are not known.  The patient denies nausea, vomiting,  diarrhea, abdominal pain, hematemesis, black or maroon stools.  He denies a  cough, wheezing, myalgias, numbness, weakness, or palpitations.  He last  dialyzed on August 4, leaving 1 gram above his dry weight and with some  hypotension during dialysis.  In the emergency room, the patient's chest x-  ray is negative for edema or infiltrate.  A spiral CT scan  was negative for  pulmonary embolism.  EKG performed in the emergency room showed sinus  tachycardia, but without acute changes.  Cardiac enzymes are negative in the  emergency room.  His temperature is 102 on presentation.  His O2 saturations  are normal and on room air.  He was admitted from the ER to rule out sepsis,  and rule out pericarditis.   LABORATORY DATA:  CBC; white blood cell count 13.6, hemoglobin 12,  hematocrit 36.8, platelet count 293.  B-peptide 48.9, D-dimer 3.39.  Blood  cultures; no growth to date.  Cardiac markers, first set; myoglobin greater  than 500, CK-MB less than 1.1, troponin less than 0.05.  UA; Negative  glucose, moderate hemoglobin, small bilirubin, 15 mg of ketones.  Urine  microscopic; total protein greater than 300, negative nitrites, negative  leukocyte esterase, urine epithelials few.  Cardiac markers, second set;  myoglobin greater than 500, CK-MB less than 1.0, troponin less than 0.05.  Comprehensive metabolic panel; sodium 133, potassium 4.1, chloride 92,  carbon  dioxide 30, glucose 154, BUN 50, creatinine 12.7, calcium 8.9, total  protein 7.9, albumin 3.2, AST 18, ALT 8, alkaline phosphatase 46, bilirubin  0.7, phosphorus 7.3.   Admission EKG; sinus tachycardia.  Admission x-rays; none.   HOSPITAL COURSE:  Problem 1.  Small pericardial effusion.  The patient was  admitted to 5500 on telemetry for vitals to be monitored q.4h.  He was  started on a course of Indocin per Dr. Myrtis Ser, who did not consult but over  the phone on 50 mg three times a day with food.  The patient was also  scheduled for a two-dimensional echocardiogram, results are above.  He  remained afebrile throughout the hospital stay.  He feels much better on the  date of discharge.  The patient was also given Protonix to be taken twice a  day along with the Indocin.  Report is as above.  Vitals were monitored.   Problem 2.  End-stage renal disease.  The patient remained on his  hemodialysis while in the hospital.  There were no changes to his outpatient  regimen.   Problem 3.  Hypertension.  Remained with good control in hospital.   Problem 4.  Secondary hyperthyroidism.  There was no intervention.   Problem 5.  Iron deficiency anemia.  The patient remained on current Epogen  dose at 8000 units IV q.hemodialysis.   Problem 6.  MSSA.  Positive blood cultures in July of 2003.  There was no  intervention to this.   DISCHARGE MEDICATIONS:  1. Norvasc 10 mg one p.o. daily.  2. Os-Cal 500 mg three tablets t.i.d. with meals.  3. Nephro-Vite one p.o. daily.  4. Protonix 40 mg two p.o. daily.  5. Renagel one p.o. daily.  6. Colchicine 0.6 mg one p.o. daily.  7. Indocin 50 mg one p.o. t.i.d. with food x4 weeks prescription given.   HEMODIALYSIS INSTRUCTIONS:  The patient will be back on outpatient  hemodialysis on Tuesday/Thursday/Saturday at [facility]. He  will receive Epogen 8000 units IV q.hemodialysis and InFeD 50 mg q.week with hemodialysis.  Estimated dry weight  is 75.0 kg with tight heparin.   DISCHARGE INSTRUCTIONS:  The patient is to have activity as tolerated.  Pain  management; he may use Tylenol 325 mg one to two as needed.  Diet is a renal  80/2/2 diet with 1200 mL fluid restriction.  The patient will return for  hemodialysis on August 9, in the a.m.  Azucena Fallen, [place]                        Maree Krabbe, M.D.    MY/MEDQ  D:  09/24/2003  T:  09/25/2003  Job:  [id number]   cc:   Willa Rough, M.D.   [facility]   [facility]

## 2010-07-04 NOTE — Op Note (Signed)
NAMEKALEL, HARTY               ACCOUNT NO.:  [id number]   MEDICAL RECORD NO.:  [id number]          PATIENT TYPE:  AMB   LOCATION:  SDS                          FACILITY:  [facility]   PHYSICIAN:  Larina Earthly, M.D.    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   PROCEDURE:  Ligation and resection of aneurysmal left upper arm  arteriovenous fistula.   SURGEON:  Gretta Began, M.D.   ASSISTANT:  Charlesetta Garibaldi, PA.   ANESTHESIA:  MAC.   COMPLICATIONS:  None.   DISPOSITION:  To recovery room, stable.   PROCEDURE IN DETAIL:  The patient was taken to the operating room and  placed in supine position, where the area of the left arm was prepped  and draped in the usual sterile fashion.  An incision was made over the  antecubital space using local anesthesia.  The patient had an upper arm  fistula with marked aneurysmal change of the antecubital vein.  The vein  was mobilized proximally and distally and was mobilized to the level of  the brachial artery anastomosis.  The vein was occluded with a fistula  clamp at the brachial artery anastomosis.  The vein aneurysm was  mobilized further proximally above the antecubital space towards the  distal upper arm.  The vein was doubly ligated with 2-0 silk ties in the  distal upper arm.  The aneurysmal segment of vein was excised.  The vein  was oversewn with 2 layers of 5-0 Prolene suture near the brachial  artery anastomosis.  The wounds were irrigated with saline and  hemostasis with the electrocautery.  Wounds were closed with  several  layers of 3-0 Vicryl in the subcutaneous tissue and the skin was closed  with a 4-0 subcuticular Vicryl stitch.  A sterile dressing was applied  and the patient was transferred to recovery room in stable condition.      Larina Earthly, M.D.  Electronically Signed     TFE/MEDQ  D:  04/08/2006  T:  04/08/2006  Job:  [id number]

## 2010-07-04 NOTE — Procedures (Signed)
NAMENARAYAN, SCULL NO.:  [id number]   MEDICAL RECORD NO.:  [id number]                   PATIENT TYPE:  OUT   LOCATION:  RAD                                  FACILITY:  [facility]   PHYSICIAN:  Nicki Guadalajara, M.D.                  DATE OF BIRTH:  10-15-1965   DATE OF PROCEDURE:  10/10/2003  DATE OF DISCHARGE:                                  ECHOCARDIOGRAM   INDICATIONS:  This study is performed in this 45 year old gentleman who has  a history of end-stage renal disease, hypertension, and who is being  evaluated for possible renal transplantation.  The echo is done to evaluate  pericardial effusion.   1. __________ adequate m-mode two dimensional and comprehensive Doppler     echocardiogram.  2. There is evidence for a moderate concentric left ventricular hypertrophy     with a septal wall thickness at 1.4-cm and posterior wall thickness of     the left ventricle at 1.3-cm.  Left ventricular end diastolic and end     systolic dimensions were 4.8-cm and 3.9-cm, respectively.  Systolic     function was in the low normal range with an ejection fraction in the 50-     55% range.  There were no focal segmental wall motion abnormalities.  3. There was mild left atrial enlargement at 4.3-cm.  The right ventricle     was normal in size and had normal function.  The right atrium was normal.  4. Aortic root dimension was normal at 3.8-cm.  5. The aortic valve was delicate and trileaflet.  There was no evidence for     any stenosis or regurgitation.  6. There was trivial mitral regurgitation.  Mitral valve leaflets were     delicate.  7. There was trivial tricuspid regurgitation, not felt to be significant.  8. Pulmonic valve was structurally normal.  9. There was evidence for a small anterior echo free space and a small     posterior echo space measuring 0.7-cm posteriorly, compatible with     minimal effusion.   IMPRESSION:  1. __________  echo Doppler study  demonstrating moderate concentric left     ventricular hypertrophy with low normal systolic function and an ejection     fraction in the 50-55% range.  2. There were no focal segmental wall motion abnormalities.  3. There was evidence for left atrial enlargement.  4. There was trivial mitral as well as tricuspid regurgitation, not felt to     be significant.  5. There was evidence for a minimal posterior pericardial effusion measuring     0.7-cm and evidence for minimal anterior echo free space.      ___________________________________________  Nicki Guadalajara, M.D.   TK/MEDQ  D:  10/10/2003  T:  10/10/2003  Job:  [id number]   cc:   Garnetta Buddy, M.D.  [address]  [place]  [place] [address]  Fax: [phone]   [facility]  [address]  [place], [place] [address]

## 2010-07-04 NOTE — Procedures (Signed)
NAMEJABREE, [name]               ACCOUNT NO.:  [id number]   MEDICAL RECORD NO.:  [id number]          PATIENT TYPE:  OUT   LOCATION:  RAD                           FACILITY:  [facility]   PHYSICIAN:  Dani Gobble, MD       DATE OF BIRTH:  [birth year]   DATE OF PROCEDURE:  10/01/2004  DATE OF DISCHARGE:                                  ECHOCARDIOGRAM   REFERRING:  Dr. Lowell Guitar.   INDICATIONS:  [name] is a 45 year old gentleman with past medical  history of hypertension and recurrent pericarditis.   Technical quality of this study is adequate.   The aorta is within normal limits measured at 3.7 cm.   Left atrium is mild to moderately dilated.   The interventricular septum and posterior wall are mildly concentrically  thickened.   The aortic valve appears to be trileaflet with reasonable leaflet excursion.  No significant aortic insufficiency is noted. Doppler interrogation of the  aortic valve is within normal limits.   The mitral valve also appears grossly structurally normal. No mitral valve  prolapse is noted. Trace to mild mitral regurgitation is noted. Doppler  interrogation of the mitral valve is within normal limits. There is  suggestion of a small echodensity just below the mitral valve apparatus in  the left ventricle. This may well represent a Lambl's excrescence or a torn  minor subvalvular apparatus, but the possibility of a vegetation cannot be  entirely excluded. The location would be unusual for this.   Pulmonic valve is grossly structurally normal.   Tricuspid valve is not well visualized. Trace to mild tricuspid  regurgitation is noted.     The left ventricle measures at the upper limits of normal with the LVIDD  measured at 5.4 cm and the LVISD measured at 4.0 cm. Overall left ventricle  systolic function appears diminished with an estimated ejection fraction of  30-40%, although at times in some views it appears to be as low as 25-35%.  There is global  hypokinesis. The inflow signal does not suggest diastolic  dysfunction. However, this most likely represents pseudo normalization.   The right atrium and right ventricle appear normal in size, and the right  ventricle systolic function is probably preserved, although this was not  well visualized.   The pericardium appears thickened, and although not measured, a rough  estimate would suggest 6-8 mm in diameter.   IMPRESSION:  1.  Left atrial enlargement.  2.  Mild concentric left ventricular hypertrophy.  3.  Trace to mild mitral and tricuspid regurgitation.  4.  I cannot exclude the possibility of a vegetation on the mitral valve      although the location would be unusual, and the small mobile echodensity      below the mitral valve apparatus may well represent Lambl's excrescence      versus a torn minor subvalvular apparatus.  5.  Left ventricular size is at the upper limits of normal.  6.  Diminished ejection fraction estimated at 30-40% but at times appears as      low as 25-30% with global  hypokinesis.  7.  Probably, the presence of diastolic dysfunction is likely present.  8.  Thickened pericardium, and although not measured, the thickness is      estimated at 6-8 mm.  9.  Consider cardiac CT/MRI if clinically indicated as well as further      evaluation of diminished left systolic function.           ______________________________  Dani Gobble, MD     AB/MEDQ  D:  10/01/2004  T:  10/02/2004  Job:  [id number]

## 2010-07-04 NOTE — H&P (Signed)
[name], [name]                           ACCOUNT NO.:  [id number]   MEDICAL RECORD NO.:  [id number]                   PATIENT TYPE:  INP   LOCATION:  1823                                 FACILITY:  [facility]   PHYSICIAN:  Maree Krabbe, M.D.             DATE OF BIRTH:  [birth year]   DATE OF ADMISSION:  09/21/2003  DATE OF DISCHARGE:                                HISTORY & PHYSICAL   REASON FOR ADMISSION:  Pleuritic chest pain, shortness of breath.   HISTORY OF PRESENT ILLNESS:  A 45 year old black male with end-stage renal  disease secondary to polycystic kidney disease on chronic dialysis every  Tuesday, Thursday and Saturday in [place], who has a history of  hypertension, gout, and secondary hyperparathyroidism, on chronic  hemodialysis, since April 2005, via left upper arm fistula.  He presents now  with persistent fevers and anterior, subxiphoid, non-radiating, pleuritic,  chest pain and shortness of breath over the last 1-2 weeks.  He states the  pain and shortness of breath are worse supine and better when he sits up.  He has been treated, since September 08, 2003, with antibiotics for one set of  blood cultures positive for methicillin-sensitive staph coagulase-negative.  He was changed to Augmentin, on September 18, 2003, because of the same above  complaints and in retrospect noted that the chest x-ray, September 08, 2003,  possibly showed a right lower lobe infiltrate versus atelectasis.  The  patient states he seemed to feel better while on IV antibiotics.  He was  seen in the emergency room, on September 14, 2003, with the same complaints of  chest pain.  They ruled out an acute myocardial infarction, and he was  discharged that evening.  He underwent a cardiac catheterization, September 17, 2003, at [facility] for a transplant workup.  The results are not known.  The  patient denies nausea, vomiting, diarrhea, abdominal pain, hematemesis,  black or maroon stools.  He denies a cough,  wheezing, myalgias, numbness,  weakness, or palpitations.  He last dialyzed yesterday, leaving 1 kg above  his dry weight and with some hypotension during dialysis.  In the emergency  room, the patient's chest x-ray is negative for edema or infiltrate.  A  spiral CAT scan was negative for pulmonary embolus.  His EKG shows sinus  tachycardia but without acute changes.  Cardiac enzymes are negative in the  emergency room.  His temperature is 102 on presentation.  His oxygen  saturation levels are normal on room air.  He is being admitted now to rule  out sepsis, rule out pericarditis.   ALLERGIES:  No known drug allergies.   CURRENT MEDICATIONS:  1. Norvasc 10 mg a day.  2. Colchicine 0.6 mg b.i.d.  3. EPO 8,000 units IV each dialysis.  4. InFeD 100 mg IV every Thursday in dialysis.  5. __________  vitamin one daily.  6. Tums Extra Strength  three with meals.  7. Augmentin 500 mg b.i.d. since September 18, 2003.   PAST MEDICAL HISTORY:  1. End-stage renal disease secondary to polycystic kidney disease on chronic     dialysis, since April 2005, via left arm A-V fistula.  2. Hypertension.  3. Gout.  4. Secondary hyperparathyroidism.  5. Iron deficiency anemia.  6. Methicillin-sensitive staph coagulase-negative bacteremia in 1 out of 2     blood culture sets, September 08, 2003.  7. Status post bilateral inguinal herniorrhaphy.  8. Currently undergoing transplant workup at [facility].   FAMILY HISTORY:  Positive for hypertension, diabetes, polycystic kidney  disease, stroke, and seizure disorder in his father.   SOCIAL HISTORY:  The patient is single.  He has two children an 73 year old  body and an 47-year-old girl who are in good health.  He does not work and  received disability.  He has a tenth grade high school education.  He denies  alcohol, tobacco, or IV drug abuse.   REVIEW OF SYSTEMS:  SKIN:  Negative for sores, open wounds, dryness,  itching.  HEENT:  Negative for  headaches, visual disturbances, nasal  congestion, sinus drainage.  RESPIRATORY:  Positive for shortness of breath  and potassium for orthopnea.  Negative for cough, wheeze, paroxysmal  nocturnal dyspnea, hemoptysis.  CARDIAC:  Negative for palpitations.  GASTROINTESTINAL:  Negative for nausea, vomiting, diarrhea, black stools,  tarry stools, maroon stools.  No abdominal pain.  EXTREMITIES:  Denies  swelling.  GENITOURINARY:  Denies dysuria, hematuria.  NEURO:  Denies  numbness, weakness, or tingling.   PHYSICAL EXAMINATION:  VITAL SIGNS:  On admission blood pressure 140/60,  temperature 102.1, respirations are 26, pulse is 110 and regular, O2 sats  are 97% on room air.  GENERAL:  A very somnolent, well-developed, well-nourished, black male in no  acute distress.  He is oriented x 3.  HEENT:  Normocephalic, atraumatic.  Pupils are equal, round, reactive to  light.  Tympanic membranes and posterior pharynx are clear.  NECK:  Supple.  No JVD, no thyromegaly.  LUNGS:  Decreased breath sounds at bases otherwise clear.  CHEST:  No pain to palpation of the rib cage.  HEART:  Tachycardic and regular.  Positive S4, S1, S2.  No rub, no murmur.  ABDOMEN:  Positive bowel sounds.  Soft, nontender, nondistended.  No masses.  EXTREMITIES:  Left upper A-V fistula, positive bruit.  No lower extremity  edema.  NEURO:  Somnolent but otherwise grossly intact.   LABS:  White count 13,600 with 84 segs, 9 lymphocytes, 6 monocytes, 1  eosinophil, hemoglobin 12.0, platelets 293,000.  D-dimmer elevated at 3.39.  Troponin less than 0.05.  Myoglobin greater than 500.  CK-MB 1.0.  Urinalysis shows 0-2 white blood cells and 3-6 red blood cells per high-  powered field, positive for protein, microscopic blood, small amount of  bilirubin, negative leukocytes, negative nitrite.   Chest x-ray:  No active disease.   EKG:  Sinus tachycardia with no acute changes.  Spiral CAT scan:  Low probability for a pulmonary  embolus.  Small posterior  pericardial effusion is seen.   ASSESSMENT/PLAN:  1. Pleuritic chest pain with fevers, tachycardia, shortness of breath     intermittent, since approximately September 14, 2003, in the setting of     positive blood cultures for methicillin-sensitive Staphylococcus     coagulase-negative, from September 08, 2003, status post 12 days of     antibiotics.  We will admit the patient to rule  out pericarditis, check     an echocardiogram, use empiric nonsteroidal antiinflammatory drugs, pan-     culture, and hold off antibiotics for now.  2. End-stage renal disease secondary to polycystic kidney disease.     Hemodialysis every Tuesday, Thursday and Saturday.  3. Secondary hyperparathyroidism.  Vitamin D is on hold secondary to     elevated phosphorous.  4. Iron deficiency anemia.  Continue EPO and InFeD.  5. Gout.  Continue colchicine.  6. Methicillin-sensitive Staphylococcus coagulase-negative, September 08, 2003,     one of two blood culture sets, status post 12 days of antibiotics.  Plan     to re-culture and hold off antibiotics for now.  7. Small posterior pericardial effusion.  Check a 2D echocardiogram.      Jacklynn Bue. Kilroy, P.A.                      Maree Krabbe, M.D.    RRK/MEDQ  D:  09/21/2003  T:  09/21/2003  Job:  [id number]   cc:   [facility]

## 2010-07-04 NOTE — Consult Note (Signed)
[name], [name]               ACCOUNT NO.:  [id number]   MEDICAL RECORD NO.:  [id number]          PATIENT TYPE:  INP   LOCATION:  A209                          FACILITY:  [facility]   PHYSICIAN:  Jorja Loa, M.D.DATE OF BIRTH:  April 20, 1965   DATE OF CONSULTATION:  10/21/2004  DATE OF DISCHARGE:                                   CONSULTATION   REASON FOR CONSULTATION:  End-stage renal disease.   Mr. Petteway is a 45 year old African-American with past medical history of  hypertension, polycystic kidney disease, history of end-stage renal disease  on maintenance hemodialysis, presently admitted because of possible  infection. Mr. Carstens has cough, pleuritic left sided chest pain. He came  to the emergency room and was found to have possible pneumonia, put on oral  antibiotics, and discharged home. After he went home, he continued to have  pain, feeling weak. Hence, the patient came back and presently admitted to  the hospital for possible pneumonia and also since there is a questionable  history of endocarditis. That also seems to be entertained. At this moment,  consult is called because he is due for dialysis. The patient at this moment  denies any nausea, vomiting. Chest pain seems to be better. He does not have  any orthopnea, but he seems to have some shortness of breath. His last  dialysis was on Saturday, September 2, and he is due for dialysis today. He  denies any nausea or vomiting.   PAST MEDICAL HISTORY:  1.  History of hypertension.  2.  History of polycystic kidney disease.  3.  History of end-stage renal disease on maintenance hemodialysis for more      than a year.  4.  History of pericarditis.  5.  History of secondary hyperparathyroidism.  6.  History of gout.  7.  History of anemia.  8.  History of bacteremia in July.  9.  History of bilateral inguinal herniorrhaphy.   FAMILY HISTORY:  His mother and father have history of polycystic kidney  disease.  According to him, he has to have transplant and is due for  dialysis, and he is also on the transplant list.   MEDICATIONS:  1.  Norvasc 10 mg daily.  2.  PhosLo 1 tablet p.o. t.i.d.  3.  Sensipar 30 mg p.o. daily.  4.  Colace 100 mg p.o. b.i.d.  5.  Levaquin 250 mg IV q.24h.  6.  Prinivil 10 mg p.o. daily.  7.  Protonix 40 mg p.o. daily.  8.  He is also getting Tylenol.   SOCIAL HISTORY:  The patient denies any history of alcohol use or any  history of drug use.   ALLERGIES:  No known drug allergies.   REVIEW OF SYSTEMS:  The patient as stated above, main complaints seem to be  pleuritic chest pain with occasional cough. No sputum production. He had  some weakness before he came because __________. He has also some history of  fever. He does not have any history. No vomiting. No urgency or frequency,  and he denies also any edema.   PHYSICAL EXAMINATION:  GENERAL:  The patient at this moment seems to be  alert in no apparent distress.  VITAL SIGNS:  His temperature is 97, blood pressure 125/73, pulse 106,  weight 193.9.  HEENT:  No conjunctival pallor, anicteric. Oral mucosa seems to be dry.  NECK:  Supple. No JVD.  CHEST:  Decreased breath sounds. Otherwise seems to be clear to  auscultation. No rales. No rhonchi. No egophony.  HEART:  His heart exam reveals regular rate and rhythm. No murmur.  ABDOMEN:  Soft. Positive bowel sounds.  EXTREMITIES:  No edema.   LABORATORY DATA:  His blood gas:  PH is 7.416, pO2 of 79%, saturation 95.6.  His white blood cell count is 10, hemoglobin 11.6, hematocrit 56.2. His  white blood cell count was 12.4 when he came. Sodium 132, potassium 4.1, BUN  55, creatinine 13.7, calcium 9.9. Urinalysis:  Specific gravity is 1.010.  blood culture from August 13:  No growth.   ASSESSMENT:  1.  History of pleuritic chest pain, presently on antibiotics. He is      afebrile. His white blood cell count is coming down. At this moment, not      sure  whether patient has pneumonia. He has some note which is suggestive      of questionable pericarditis. I am not sure whether that is being      followed as an outpatient.  2.  End-stage. BUN and creatinine within acceptable range. Normal potassium.      However, the patient is due for dialysis today. He has also some sign of      fluid overload.  3.  Hypertension. He is on Prinivil and Norvasc. Blood pressure seems to be      controlled very well.  4.  History of hyperparathyroidism. He is on PhosLo and Sensipar.   RECOMMENDATIONS:  Will make an arrangement for him to get dialysis today.  Will continue other medications and will follow patient. When he is  discharged, he will return to Dr. Lowell Guitar at [facility].      Jorja Loa, M.D.  Electronically Signed     BB/MEDQ  D:  10/21/2004  T:  10/21/2004  Job:  [id number]

## 2010-07-04 NOTE — H&P (Signed)
[name], [name]                           ACCOUNT NO.:  [id number]   MEDICAL RECORD NO.:  [id number]                   PATIENT TYPE:  INP   LOCATION:  5503                                 FACILITY:  [facility]   PHYSICIAN:  [name], M.D.            DATE OF BIRTH:  1966/02/02   DATE OF ADMISSION:  09/08/2003  DATE OF DISCHARGE:                                HISTORY & PHYSICAL   REASON FOR ADMISSION:  Fever, chills.   HISTORY OF PRESENT ILLNESS:  A 45 year old black male with end-stage renal  disease secondary to hypertension and known polycystic kidney disease.  He  is on chronic hemodialysis Tuesday, Thursday and Saturday at the [name] and presented for outpatient dialysis today in his usual state  of health without complaints.  While on dialysis, his temperature started to  rise, and he had hard shaking chills and became more lethargic.  His  temperature maxed to 103.  He felt weak.  He complained of headache and  myalgias.  Blood cultures were obtained, and he was given 2 grams of Ancef,  140 mg of gentamicin, 650 mg of Tylenol.  He completed his dialysis  treatment and was sent to [name] for evaluation.  In the ER, the patient states that on the day prior to admission, he was  evaluated at [name] for transplant evaluation.  He underwent  an MRI with contrast dye to look at his heart, according to the patient.  Some positive findings were evident, and he is scheduled for a cardiac  catheterization, September 10, 2003, at [name] for a presumed blockage according  to the patient.  Otherwise, he denies cough, nausea, vomiting, upper  respiratory infection, sore throat, dysuria, rash, or any open wounds on his  skin.  He has not had any diarrhea, no abdominal pain.  In the emergency  room, he actually feels better than when he left the [name].  He is being admitted to rule out sepsis.   ALLERGIES:  No known drug allergies.   CURRENT MEDICATIONS:  1. Norvasc 10 mg a day.  2. Tums five with each meal.  3. Renax vitamin daily.   PAST MEDICAL HISTORY:  1. End-stage renal disease on chronic hemodialysis since April 2005.  2. Hypertension.  3. Polycystic kidney disease.  4. Iron deficiency anemia.  5. Gout.  6. Left upper arm fistula, created January 2004.   FAMILY HISTORY:  Positive for hypertension, diabetes mellitus, polycystic  kidney disease, stroke, and seizure disorder in his father.   SOCIAL HISTORY:  The patient is single.  Has two children, a boy 69 and a  girl 15-years-old who are in good health.  He does not work and receives  disability.  He has a tenth grade high school education.  He denies alcohol  or tobacco use or other illicit drug use.  REVIEW OF SYSTEMS:  Positive for myalgias, headache, malaise, hard shaking  chills, fever, and weakness.  Negative for cough, sore throat, nasal  congestion, nausea, vomiting, diarrhea, abdominal pain, chest pain, dysuria,  hematuria, open sores, or wounds.   PHYSICAL EXAMINATION:  VITAL SIGNS:  On admission, blood pressure 103/65,  heart rate is 105 and regular, temperature 100.1, respirations are 22.  GENERAL:  A well-developed, well-nourished black male in no acute distress,  who is awake, alert, appropriate, and oriented x 3.  HEENT:  Normocephalic, atraumatic.  Posterior pharynx is mildly injected.  Pupils are equal reactive to light.  SKIN:  Warm and dry.  No open wounds.  NECK:  Supple.  No JVD.  LUNGS:  Clear to auscultation with decreased breath sounds at the left base.  HEART:  Borderline tachycardic but regular in rhythm with a grade 2/6  systolic ejection murmur.  ABDOMEN:  Positive bowel sounds, soft, nontender, nondistended.  Liver  distends 2-cm below the right costal margin.  EXTREMITIES:  Left upper arm A-V fistula, positive bruit and unremarkable.  Extremities with no peripheral edema.    LABS:  On admission:  White count 13,900, with 87% segs, 6% lymphs, 7%  monos, 0% eosinophils and basophils.  Hemoglobin 14.4, hematocrit 44.5,  platelets 136,000.  Sodium 138, potassium 4.1, chloride 95, CO2 35, glucose  96, BUN 22, creatinine 7.5, calcium 10.3.  Total protein 8.0.  LFTs within  normal limits.  Phosphorous 3.4.  Urine is yellow, specific gravity of  1.017, pH of 6.0, small amount of leukocyte esterase present but negative  nitrite, 0-2 white blood cells and red blood cells per high-powered field.   Chest x-ray is pending.   ASSESSMENT/PLAN:  1. Fever and chills with malaise, headache, and myalgias while on dialysis.     We will rule out sepsis.  Continue Ancef and gentamicin.  2. End-stage renal disease.  Continue dialysis Tuesday, Thursday and     Saturday while in [facility].  3. Anemia.  4. Hypertension.  Continue Norvasc.  5. Polycystic kidney disease.      [name], P.A.                      [name], M.D.    RRK/MEDQ  D:  09/08/2003  T:  09/09/2003  Job:  [id number]   cc:   [name]

## 2010-07-04 NOTE — Op Note (Signed)
   NAMEDELAN, KSIAZEK                           ACCOUNT NO.:  [id number]   MEDICAL RECORD NO.:  [id number]                   PATIENT TYPE:  OIB   LOCATION:  2899                                 FACILITY:  [facility]   PHYSICIAN:  Larina Earthly, M.D.                 DATE OF BIRTH:  Dec 25, 1965   DATE OF PROCEDURE:  DATE OF DISCHARGE:  01/16/2002                                 OPERATIVE REPORT   PREOPERATIVE DIAGNOSIS:  End stage renal disease.   POSTOPERATIVE DIAGNOSIS:  End stage renal disease.   PROCEDURE:  Creation of left wrist amino AV fistula.   SURGEON:  Dr. Tawanna Cooler Early.   ASSISTANT:  Lissa Merlin, P.A.-C.   ANESTHESIA:  MAC.   COMPLICATIONS:  None.   CONDITION TO RECOVERY:  Stable.   PROCEDURE IN DETAIL:  The patient was taken to the operating room, placed in  chair, the area of the left arm prepped and draped in the usual sterile  fashion.  Using local anesthesia, incision made from the level of the  cephalic vein in the radial artery to wrist.  The cephalic vein was of good  caliber.  Tributary branches were ligated with 3-0 and 4-0 silk ties and  divided.  The vein was mobilized distally and was ligated with a 2-0 silk  tie.  The vein was mobilized to the level of the radial artery.  The radial  artery was occluded proximally and distally with an 11 blade and extended  longitudinally with Potts scissors.  The vein was spatulated and sewn end-to-  side to the artery with a running 6-0 Prolene suture.  Clamps were removed  and good thrill was noted.  The wound were irrigated with saline, hemostasis  obtained with electrocautery and the wound was closed with 3-0 Vicryl in the  subcutaneous, subcuticular.                                               Larina Earthly, M.D.    TFE/MEDQ  D:  02/24/2002  T:  02/25/2002  Job:  [id number]

## 2010-07-04 NOTE — Consult Note (Signed)
[place]  Patient:    [name], [name] Visit Number: [id number] MRN: [id number]          Service Type: MED Location: 5500 [phone] Attending Physician:  Sanjuana Letters Dictated by:   Garnetta Buddy, M.D. Proc. Date: 08/15/01 Admit Date:  08/14/2001   CC:         William A. Leveda Anna, M.D.   Consultation Report  REQUESTING PHYSICIAN:  Santiago Bumpers. Leveda Anna, M.D.  REASON FOR CONSULTATION:  Elevated serum creatinine.  HISTORY OF PRESENT ILLNESS:  The patient is a 45 year old African-American male with adult polycystic kidney disease, serum creatinine 2.5501, was admitted with a 1-week history of right-sided flank pain radiating to the back, fever and chills for the last 4 days.  No dysuria, no cough.  CT scan performed in the emergency room August 12, 2001 revealed polycystic kidney disease.  PAST MEDICAL HISTORY: 1. Hypertension. 2. Polycystic kidney disease.  MEDICATIONS:  Norvasc 10 mg daily, Lasix 20 mg daily, Cipro 250 mg daily since August 12, 2001.  Percocet p.r.n.  ALLERGIES:  No known drug allergies.  SOCIAL HISTORY:  No tobacco use.  No alcohol use.  Lives alone.  Two children. 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  FAMILY HISTORY:  Father end-stage renal disease secondary to hypertension, mother polycystic kidney disease status post renal transplantation.  Two brothers, one sister.  REVIEW OF SYSTEMS:  EYES: No visual complaints.  EAR/NOSE/THROAT: No difficulty with hearing, no nosebleeds, no oral lesions.  CARDIOVASCULAR:  No anginal chest pains, orthopnea, PND, palpitations, or blackouts.  RESPIRATORY: No cough, wheeze, hemoptysis.  ABDOMINAL: Complains of abdominal discomfort, nausea, vomiting, sweats and chills.  UROGENITAL: No dysuria, frequency, urgency.  NEUROLOGIC: No headache, no double vision, blurred vision, difficulty with speech.  No numbness, pins and needles or weakness in his upper or lower extremities.  RHEUMATOLOGIC:  No joint pains or swelling. Complains of some forefoot discomfort for the past week, states he has had pain in the forefoot for the past year.  Walks with crutches due to forefoot pain.  ENDOCRINE: No diabetes, no thyroid disease, no dyslipidemia.  HEM/ONC: No bleeding disorder.  No personal history of cancer.  PHYSICAL EXAMINATION:  GENERAL:  Alert gentleman in no obvious distress.  Feels warm to touch.  VITAL SIGNS:  Blood pressure 140/98, pulse of 100, temperature 101.  HEAD AND EYES:  Normocephalic, atraumatic.  No icterus.  No pallor.  EARS/NOSE/MOUTH AND THROAT:  External appearance normal.  NECK:  Supple.  No thyromegaly.  No adenopathy.  CARDIOVASCULAR:  Regular rate and rhythm, hyperdynamic nondisplaced apex beat. No rubs or gallops.  RESPIRATORY:  Lungs fields are clear to auscultation, anterior percussion that is resonant.  No rales, no wheezes.  ABDOMEN:  Distended.  Decreased bowel sounds.  Tender over the right upper quadrant to mild superficial palpation.  Bowel sounds are diminished.  NEUROLOGIC:  Cranial nerves II-XII intact.  Reflexes symmetric.  No loss of sensation in the lower extremities.  EXTREMITIES:  Nontender on left forefoot compression.  No joint pains or joint swelling.  SKIN:  There is no rash.  GENITAL:  Circumcised male.  No testicular masses.  LABORATORIES:  White blood count 9.4, hemoglobin 11.5, platelets 215.  Sodium 139, potassium 4.9, chloride 107, CO2 24, BUN 47, creatinine 4.6, glucose 105. LFTs: Albumin 3.2, AST 19, ALT 15, alkaline phosphatase 74.  Urinalysis trace hemoglobin, greater 300 mg/dL protein.  ASSESSMENT AND PLAN: 1. Acute on chronic renal insufficiency consistent with volume  depletion.    Continue IV fluids at 100 cc/hr.  May be history of polycystic kidney    disease.  May be progressive renal insufficiency from polycystic kidneys. 2. Hypertension.  Poorly controlled at present.  Continue the labetalol 100 mg     b.i.d. and Norvasc.  May have room for ACE inhibitor once stabilized. 3. Flank pain.  Check abdominal CT scan to rule out an infected polycystic    kidney.  Agree with surgical evaluation.  Has dilated bile ducts on    ultrasound.  No evidence of cholangitis clinically.  We will check amylase    and lipase. Dictated by:   Garnetta Buddy, M.D. Attending Physician:  Sanjuana Letters DD:  08/15/01 TD:  08/17/01 Job: [id number]

## 2010-07-04 NOTE — Discharge Summary (Signed)
[name], [name]                           ACCOUNT NO.:  [id number]   MEDICAL RECORD NO.:  [id number]                   PATIENT TYPE:  INP   LOCATION:  5503                                 FACILITY:  [facility]   PHYSICIAN:  Zenovia Jordan, P.A.                DATE OF BIRTH:  [birth year]   DATE OF ADMISSION:  09/08/2003  DATE OF DISCHARGE:  09/10/2003                                 DISCHARGE SUMMARY   ADMITTING DIAGNOSES:  1.  Fever and chills, rule out sepsis.  2.  End-stage renal disease on chronic hemodialysis.  3.  Anemia of chronic disease.  4.  Hypertension.  5.  Polycystic kidney disease.   DISCHARGE DIAGNOSES:  1.  Acute febrile illness with rigors while on dialysis resolved with      antibiotics, cultures remain negative.  2.  End-stage renal disease on chronic hemodialysis.  3.  Hypertension.  4.  Anemia of chronic disease.  5.  Polycystic kidney disease.   BRIEF HISTORY:  A 45 year old black male with end-stage renal disease on  chronic hemodialysis every Tuesday, Thursday, Saturday at the [facility], who also has hypertension and polycystic kidney disease,  presented for outpatient dialysis on the day of admission in his usual state  of  health without complaints.  While on dialysis, his temperature spiked to  103, and he had hard, shaking chills and became lethargic, complaining of  weakness, headache, and myalgias.  Blood [facility] were obtained at the  [facility], and he received 2 grams of Ancef and 140 mg of Gentamicin and  650 mg of Tylenol.  He completed his dialysis treatment and came to the [facility]  Emergency Room for evaluation.  The patient reports that on the day prior to  admission, he was at [facility] for transplant evaluation, where  he underwent an MRI with contrast dye to look at his heart, according to the  patient.  There were some positive findings evident because he was scheduled  for a cardiac catheterization September 10, 2003, at [facility] for a presumed  blockage, according to the patient.  He otherwise denies cough, nausea,  vomiting, upper respiratory infection, sore throat, dysuria, rash, or any  open wounds on his skin.  He denies abdominal pain, diarrhea, and melena.  He states he actually feels better than when he left the [facility].  He  is now being admitted to rule out sepsis.   LABS ON ADMISSION:  White count 13,900 with 87 segs, 6 lymphocytes, 7  monocytes, 0 eosinophils.  Hemoglobin 14.4, platelets 136,000.  Sodium 138,  potassium 4.1, chloride 95, CO2 35, glucose 96, BUN 22, creatinine 7.5.  Calcium 10.3.  Total protein 8.0.  LFTs within normal limits.  Phosphorus  3.4.  Urinalysis shows 0-2 white and red blood cells per high powered field,  a small amount of leukocyte esterase, but negative nitrite,  pH 7.60,  specific gravity 1.017, and it appears yellow and clear.   HOSPITAL COURSE:  Upon admission, more blood cultures were obtained.  Chest  x-ray was done that showed some patchy right base atelectasis.  He was  continued on Ancef and gentamicin while in the hospital.  Urine and blood  cultures remained negative, and within 48 hours he was feeling back to  baseline.  He was discharged home on his same medications and including  Ancef 2 grams IV each dialysis x3 pending culture results along with  gentamicin 70 mg IV each dialysis x3 pending culture results.   DISCHARGE MEDICATIONS:  1.  Norvasc 10 mg q.h.s.  2.  Renax one daily.  3.  Tums 5 with each meal.  4.  Ancef and gentamicin as mentioned above.   Dialysis Tuesday, Thursday, Saturday in [place].  Dry weight unchanged.  Cardiac cath appointment at [facility], Monday, July 25th.                                                Zenovia Jordan, P.A.    RRK/MEDQ  D:  10/27/2003  T:  10/28/2003  Job:  [id number]   cc:   [facility]

## 2010-07-04 NOTE — H&P (Signed)
NAMEBRAUN, [name]               ACCOUNT NO.:  [id number]   MEDICAL RECORD NO.:  [id number]          PATIENT TYPE:  INP   LOCATION:  A209                          FACILITY:  [facility]   PHYSICIAN:  Margaretmary Dys, M.D.DATE OF BIRTH:  07-May-1965   DATE OF ADMISSION:  10/20/2004  DATE OF DISCHARGE:  LH                                HISTORY & PHYSICAL   PRIMARY CARE PHYSICIAN:  Casimiro Needle, M.D. of the [facility].   ADMISSION DIAGNOSES:  1.  Fever with chills and rigors.  2.  Left lobar pneumonia.  3.  Left-sided chest pain likely secondary to pleuritic pain from pneumonia      versus recurrent pericarditis.  4.  Cannot exclude mitral valve endocarditis.  5.  End-stage renal disease on hemodialysis Tuesday, Thursday, and Saturday.   CHIEF COMPLAINT:  Fever with chills and left-sided chest pain of three days  duration.   HISTORY OF PRESENT ILLNESS:  [name] is a 45 year old  African American  male with end-stage renal disease on chronic hemodialysis every Tuesday,  Thursday, and Saturday at the [facility].  He has end stage  renal disease from polycystic kidney disease.  He is also currently on the  kidney transplant list.  The patient reports doing fairly well until three  days ago when he noticed some fever.  He checked his temperature at home and  it was 100.5.  He subsequently went into [facility] yesterday,  where he had some chest x-rays and some blood work done , he was told that  he has left lobar pneumonia.  He was discharged with Z-pack.   He subsequently was told to follow up with his physicians on Tuesday during  his dialysis.  However, overnight the patient felt weaker and lethargic and  had more pain on the left side of his chest.  He described this pain as  constant and sharp-like.  It is non-radiating.  He says the pain is  exacerbated by him coughing or movement.  It is slightly relieved by a  change in position by bending  forward.  Otherwise, he feels the pain is  rather constant and  an 8 at it's worst.  He does not have  diaphoresis.  It  is not worsened by exertion.  He describes what appears to be a pleuritic  chest pain.   He reports that the pain actually may have started last Thursday and during  his dialysis on Saturday his physician was informed, who called in an order  for two antibiotics.  He does not know the name of those antibiotics and I  do not have the records at this time.   He also complains of some myalgia and lethargy.   He  had an  an echocardiogram on 10/01/2004 , which was read by Dr.  Domingo Sep.  She commented about a possible  vegetation on the mitral valve,  although she noted that the location would be unusual.  She could not make a  definitive call on this abnormality.  It was recommended that a cardiac CT  or  MRI done to follow up but there was no transesophageal echocardiogram  recommended.  However, the patient said he is unaware of these results.   He feels slightly better, as he received some Dilaudid in the emergency  room.  He also received 1 g of vancomycin.  The patient is being admitted  now for further evaluation and management.  He has had two sets of blood  cultures drawn.  He does have a history of recurrent pericarditis, for which  he has been treated in the past with nonsteroidal anti-inflammatory agents.   The patient also reports some shortness of breath at rest, which is entirely  new.  He doubts this is related to fluid overload, as his weight on Saturday  was only up by 1 kg from his dry weight.  His dry weight is 80 kg.  He does  not have any pedal edema.  He does have some cough productive of whitish  frothy sputum.  No blood was noted.  As mentioned, his left-sided chest pain  appeared to be exacerbated by coughing.   REVIEW OF SYSTEMS:  A 10 point review of systems is as mentioned in the  history of present illness above.   PAST MEDICAL HISTORY:   1.  End-stage renal disease secondary to polycystic kidney disease on      dialysis.  2.  Hypertension.  3.  Polycystic kidney disease.  4.  Pneumonia.  5.  History of recurrent pericarditis.  6.  Secondary hyperparathyroidism.  7.  Iron deficiency anemia.  8.  Gout.  9.  History of bacteremia in July 2005.  10. Status post bilateral inguinal herniorrhaphy.  11. Currently listed for a kidney transplant.   MEDICATIONS:  Include:  1.  Lisinopril 10 mg p.o. daily.  2.  Protonix 40 mg p.o. daily.  3.  Norvasc 10 mg p.o. daily.  4.  Sensipar 30 mg p.o. daily.  5.  Phos-Lo 67 mg with meals t.i.d.  6.  Zithromax 250 mg p.o. daily, which he started yesterday after a loading      dose of 500 mg.  I doubt that this is a complete list of the patient's medicines and I have  asked his family members to please bring in all of his medication bottles so  we can do a reconcilation as soon as possible.   ALLERGIES:  NO KNOWN DRUG ALLERGIES.   FAMILY HISTORY:  Positive for hypertension, diabetes, and polycystic kidney  disease in the mother.  She has received a kidney transplant.  Also positive  for stroke and seizure disorder in his father.   SOCIAL HISTORY:  The patient is single.  He has two children ages 73 and 46  years old who are in good health. They have not been screened for polycystic  kidney disease.  He does not work and he is currently on disability.  He has  a 10th grade high school education.  He denies any alcohol, tobacco, or IV  drug abuse.   PHYSICAL EXAMINATION:  GENERAL:  Conscious, alert, in mild respiratory  distress.  He is dyspneic.  VITAL SIGNS:  Blood pressure is 138/94, pulse of 102, respiratory rate was  ranging between 32-42, temperature 97.1 degrees Fahrenheit, oxygen  saturation was 100% on room air.  He describes his pain an 8/10 on the left  side of his chest.  HEENT:  Normocephalic atraumatic.  Oral mucosa was moist with no exudates. NECK:  Supple.  No JVD.   No lymphadenopathy.  He has a bruit on the left  side of his neck consistent with his A-V graft.  LUNGS:  He had crackles in the left base.  Otherwise, it was clear in the  rest of his lungs with no rhonchi or wheezing heard.  HEART:  S1 and S2 regular.  No S3, S4, gallops, or rubs.  No pericardial rub  was heard.  ABDOMEN:  Distended.  Soft.  Mildly tender.  The patient has diffuse masses  consistent with his polycystic kidney disease.  Bowel sounds were positive.  EXTREMITIES:  Trace pitting pedal edema bilaterally.  CENTRAL NERVOUS SYSTEM:  Grossly intact with no focal deficits.   LABORATORY DATA:  A white blood cell count was 10.8, hemoglobin 11.6,  hematocrit 36.2, platelet count was 222.  He had some left shift with  neutrophils of 82%.  D-dimer was 3.03.  Sodium 132, potassium 4.1, chloride  of 93, CO2 of 28, glucose 96, BUN of 55, creatinine is 13.7.  Cardiac  enzymes were negative.  Blood cultures were negative thus far.  A chest x-  ray shows some linear opacities in the left mid lung, as well as increased  densities in the left retrocardiac region and left costophrenic angle.  This  raises the possibility of bronch-pneumonia.  He also had low lung volumes.   A 12 lead EKG shows inverted T waves in all the leads.  I do not have one to  compare with at this time but I do not see any acute ST-T changes.   ASSESSMENT/PLAN:  [name] is a 45 year old African American male with  end-stage renal disease on hemodialysis over the past one year.  He seems to  be doing fairly well with  hemodialysis, though he has had multiple  complications including bacteremia and recurrent pericarditis, which seems  to have stabilized.  He presents to the hospital with complaints of fever,  shortness of breath, and left-sided pleuritic chest pain.  I think his  symptoms, physical exam, and his chest x-ray findings are consistent with an  acute pneumonia episode at this time.  We will admit him and  treat him as  such.  The patient has received vancomycin 1 g in the emergency room, which  I think is entirely appropriate.  I will give him Levaquin here 500 mg IV x1  and  250 mg every 48 hours to cover for community-acquired organisms.   He had an echocardiogram on October 01, 2004 read by [facility]  with a concern for mitral valve vegetation.  However, this was not pursued  further.  I would rule out  endocarditis .  Blood cultures have been drawn.  I will also request cardiology to see him.  Perhaps they may want to proceed  with a transesophageal echocardiogram to confirm the abnormality that was  seen on the three dimensional echocardiogram.   We will resume his dialysis here on Tuesday, Thursday, and Saturday.  We  will request Dr. Kristian Covey, our nephrologist here, to follow him.   Deep venous thrombosis prophylaxis will be with heparin and gastrointestinal  prophylaxis with Protonix.  I will resume all  his home medications.  I have requested his family  members to bring him his medication bottles for reconcilation.  We will also  be calling the kidney center for an updated list of his medications and what  antibiotics he received on Saturday.    This could also be acute pericarditis but I do not see any ST  changes to  suggest this,his blood urea nitrogen is elevated but it appears to be at  about his normal range.  I do not see any indication for acute hemodialysis  at this point.   I have explained the above plan to the above plan, who verbalized full  understanding.      Margaretmary Dys, M.D.  Electronically Signed     AM/MEDQ  D:  10/20/2004  T:  10/20/2004  Job:  [id number]

## 2010-07-04 NOTE — Discharge Summary (Signed)
[name], [name]               ACCOUNT NO.:  [id number]   MEDICAL RECORD NO.:  [id number]          PATIENT TYPE:  INP   LOCATION:  A209                          FACILITY:  [facility]   PHYSICIAN:  Osvaldo Shipper, MD     DATE OF BIRTH:  08/30/1965   DATE OF ADMISSION:  10/20/2004  DATE OF DISCHARGE:  09/13/2006LH                                 DISCHARGE SUMMARY   DISCHARGE DIAGNOSES:  1.  Left lobar pneumonia, currently resolved.  2.  Pleuritic chest pain, likely related to #1 versus pericarditis.  3.  Left ventricular apical thrombus, on Coumadin.  4.  Hypertension, stable.  5.  Anemia, stable.  6.  End-stage renal disease on dialysis, stable.   Please review history and physical dictated at the time of admission for  details regarding patient's presenting illness.   HOSPITAL COURSE:  The patient is a 45 year old African-American male with  past medical history of end-stage renal disease secondary to polycystic  kidney disease, currently on hemodialysis on Tuesday, Thursday, Saturday.  The patient was admitted to the hospital with fever, chills, and rigors.  He  was also found to have a left lobar pneumonia.  The patient was complaining  of severe left-sided pleuritic chest pain.  The patient was subsequently  admitted for further management of these problems.   Problem 1. Pleuritic chest pain.  The patient has a history of pericarditis  in the past.  During his previous echo done about a month ago, there was a  possibility of vegetation on the mitral valve.  The patient did not have any  followup for this subsequent to that echo.  Because he was having fever and  chills, the patient was given vancomycin in the ED.  The patient was  subsequently consulted upon by Dr. Domingo Sep and another echocardiogram was  done to follow up on the vegetation.  This echo showed stability of the  lesion seen on the mitral valve previously and possibility of vegetation was  low.  However, on this  echocardiogram, possible apical thrombus was noted.  The patient subsequently underwent a CAT scan of the chest which did not  reveal any PE and did not show any evidence for pericarditis.  Subsequently,  the patient was started on heparin with Coumadin for the apical thrombus.  The patient was also started on Indocin for his pleuritic chest pain.  With  all the above management, the patient's symptoms subsided.  He has been  symptom free for the past two to three days.  He has been waiting for his  INR to become therapeutic, and they are finally in therapeutic range today.  He is considered stable for discharge.   Problem 2. Cardiac.  The patient is found to have cardiomyopathy with an EF  of 35%.  Dr. Domingo Sep feels the patient needs outpatient workup including  possibility of stress test versus angiogram.  This will be arranged by her  as an outpatient.  The patient has been started on a beta blocker in the  hospital.  The patient was also ruled out for acute coronary syndrome  at the  time of admission.   Problem 3. Hypertension.  The patient was on lisinopril and Norvasc at home.  He was started on beta blocker here.  His pressures were noted to go down a  little bit.  Hence, his Norvasc was reduced to half.  Otherwise, his blood  pressure has been stable for the past two weeks.   Problem 4. Apical thrombus as mentioned above.  The patient was started on  Coumadin.  His INR is 2.2 today.  He will follow up with Dr. Roque Lias  office for an INR check on 9/15.   Problem 5.  End-stage renal disease.  The patient was continued on  hemodialysis in the hospital by Dr. Kristian Covey.  He was dialyzed after he was  given contrast material for a CAT scan of the chest one extra time.  He  needs to go back to his regular schedule of Tuesday, Thursday, and Saturday.  The patient has been asked to call the dialysis center to restart his  outpatient dialysis.  He will also be contacted from the  hospital to let  them know.   Problem 6.  Anemia.  The patient has anemia of chronic disease  versus iron  deficiency.  He is getting Epogen along with his dialysis.  His hemoglobin  has been stable.   Problem 7.  Diarrhea.  The patient has been having about three to four bowel  movements which are watery on a daily basis.  He is not getting dehydrated.  He has a small quantity.  Stool studies were done which were all negative  for any infectious etiology or inflammatory etiology.  Prescribed Imodium  for him to take at home.   DISCHARGE MEDICATIONS:  1.  Levofloxacin 250 mg p.o. once daily for four more days.  2.  Lisinopril 10 mg p.o. daily.  3.  Metoprolol 50 mg p.o. b.i.d.  4.  Carafate 0.5 g p.o. with each meal and at h.s.  5.  Norvasc 5 mg daily.  6.  Coumadin 5 mg q.h.s.  INR to be checked on 9/15 at Dr. Roque Lias      office.  The dose might be changed.  7.  Indocin 50 mg p.o. t.i.d. with meals for 10 more days.  8.  Imodium 2 mg p.r.n. for diarrhea.   The patient has been asked to resume his other outpatient medications.  He  has been told that his Norvasc dose had been changed as mentioned above.   DISCHARGE INSTRUCTIONS:  1.  Followup care with Dr. Domingo Sep in two to three weeks.  2.  With Dr. Domingo Sep for INR check on 10/31/04.  3.  PMD.  The patient is requesting Dr. Ouida Sills as he has seen him in the      past.  We will try to obtain an appointment for him with Dr. Ouida Sills.  4.  Follow-up at kidney center for dialysis.   DIET:  The patient may have a cardiac diet.   ACTIVITY:  No restrictions.   IMAGING STUDIES:  1.  Imaging studies done during this admission include a CAT scan of the      chest with contrast as discussed above.  2.  Chest x-rays were done that showed possible left lower lobe pneumonia.   Other procedures done include echocardiogram as discussed above.   Consultations obtained include Dr. Domingo Sep from cardiology.  Dr. Kristian Covey  from  renal.     Osvaldo Shipper, MD  Electronically Signed  GK/MEDQ  D:  10/29/2004  T:  10/29/2004  Job:  [id number]   cc:   Dani Gobble, MD  Fax: [phone]   Jorja Loa, M.D.  Fax: [phone]   Mindi Slicker. Lowell Guitar, M.D.  [address]  [place]  [place] [address]  Fax: [phone]   Kingsley Callander. Ouida Sills, MD  [address]  [place]  [place] [address]  Fax: [phone]

## 2010-07-04 NOTE — Procedures (Signed)
NAMEORYN, CASANOVA               ACCOUNT NO.:  [id number]   MEDICAL RECORD NO.:  [id number]          PATIENT TYPE:  INP   LOCATION:  A209                          FACILITY:  [facility]   PHYSICIAN:  Dani Gobble, MD       DATE OF BIRTH:  1966-02-10   DATE OF PROCEDURE:  10/22/2004  DATE OF DISCHARGE:                                  ECHOCARDIOGRAM   REFERRING PHYSICIAN:  Margaretmary Dys, M.D.   INDICATIONS:  Mr. Paynter is a 45 year old gentleman with past medical  history of end-stage renal disease on hemodialysis with also a history of  recurrent pericarditis.  He is referred for recurrent pleuritic chest pain  to evaluate for the possibility of recurrent pericarditis.   The technical quality of the study is adequate.   M-MODE TRACINGS:  The aorta is within normal limits measured at 3.7 cm.   The left atrium is mildly dilated.   The intraventricular septum and posterior wall are both mild to moderately  thickened.   The aortic valve appears to be trileaflet with reasonable leaflet excursion.  No significant aortic insufficiency is noted.  Doppler interrogation of the  aortic valve is within normal limits.   The mitral valve appears grossly structurally normal.  There is mild mitral  annular calcification noted.  No prolapse is noted.  Trivial mitral  regurgitation is noted.  There is again noted a small mobile echodensity  just below the mitral valve in the left ventricle that is most consistent  with a Lambl excrescence versus a torn minor subvalvular apparatus, but the  possibility of a vegetation cannot be entirely excluded.  It is unchanged in  appearance from prior echocardiogram several weeks ago.   The pulmonic valve is not well-visualized.   Tricuspid valve appears grossly structurally normal with trivial tricuspid  regurgitation noted.   The left ventricle is normal in size.  The ejection fraction is diminished  and estimated at 30-40% with mild global  hypokinesis and moderate anterior  septal wall hypokinesis.  The apex also is not well-visualized, but in the  few views that it is, it appears severely hypokinetic.  There is a  suggestion of an apical thrombus as well.  The inflow signal does not  suggest diastolic dysfunction, but I believe this likely represents pseudo  normalization.   The right atrium and right ventricle appear grossly structurally normal.  RV  function is difficult to assess the right-sided structures were not well-  visualized.   There appears to be in limited views, a small pericardial effusion without  evidence of hemodynamic compromise.  In one view only, there is a suggestion  that there is a more sizeable posterior pericardial effusion, but in most  views this appears pleural.  Again, there is no evidence for hemodynamic  compromise on this study.  The pericardium itself again appears thickened  and quite echogenic as noted on the prior study several weeks ago.  This  does not appear to represent a significant change from prior echocardiogram,  but as previously I would again recommend cardiac CT or MRI as  well as  further evaluation of diminished left ventricular systolic function.           ______________________________  Dani Gobble, MD     AB/MEDQ  D:  10/22/2004  T:  10/23/2004  Job:  [id number]   cc:   Mindi Slicker. Lowell Guitar, M.D.  [address]  [place]  [place] [address]  Fax: [phone]

## 2010-07-04 NOTE — Discharge Summary (Signed)
[facility]  Patient:    [name], [name] Visit Number: [id number] MRN: [id number]          Service Type: REC Location: [facility] Attending Physician:  Almetta Lovely Dictated by:   Rodolph Bong, M.D. Admit Date:  08/23/2001 Disc. Date: 08/18/01   CC:         Dr. Lowell Guitar, [facility]   Discharge Summary  DISCHARGE DIAGNOSES: 1. Polycystic kidney disease. 2. Ileus, resolved. 3. Infected cyst "presumed". 4. Acute and chronic renal failure, improved. 5. Iron deficiency anemia. 6. Gout.  DISCHARGE MEDICATIONS: 1. Levaquin 500 mg p.o. q.d. times one then 250 mg p.o. q.d. times seven days. 2. Colchicine 0.6 mg p.o. b.i.d. 3. Norvasc 10 mg p.o. q.d. 4. Labetalol HCL 100 mg p.o. b.i.d. 5. Lasix 20 mg p.o. q.d.  PROCEDURES: 1. Abdominal pelvic CT on August 14, 2001. 2. Abdominal x-ray on August 14, 2001. 3. Chest x-ray on August 15, 2001. 4. Right upper quadrant ultrasound on August 15, 2001. 5. Echocardiogram on August 15, 2001. 6. Renal CT on August 15, 2001. 7. HIDA scan on August 16, 2001. 8. EKG on August 14, 2001. 9. Foot x-rays on August 18, 2001.  CONSULTATIONS: 1. Renal consult. 2. Surgery consult, Dr. Magnus Ivan.  HISTORY OF PRESENT ILLNESS: The patient is a 45 year old African-American male with past history significant for hypertension and polycystic renal disease, who presents to the ED with complaints of abdominal pain. He says that the pain began in his right upper quadrant, right flank region. He originally presented to [facility] ED and was diagnosed with UTI. Discharge on Percocet and Cipro. However, his pain returned and actually worsened. At that point, he presented to [facility] ED. Stated that he could hardly breath secondary to the pain and movement intensifies the pain as well. He denies any dysuria or gross hematuria. He does acknowledge fever and chills over the past 24 hours. He has been constipated and now bowel  movement for one week.  HOSPITAL COURSE: #1 - ABDOMINAL PAIN: Upon presentation, the patient had normal LFTs. Right upper quadrant ultrasound was obtained on August 15, 2001 which revealed sludge and increase in size of the common bile duct. Consequently, he underwent a HIDA scan on August 16, 2001 which indicated normal gallbladder filling and ruled out the possibility of cholelithiasis. In addition, a renal CT was obtained on August 15, 2001 which showed a dominant right sided renal cyst with density that was consistent with blood or infection. Therefore a hemorrhagic and possibly infected right renal cyst was the most likely source for pain for [name]. A surgery consult was obtained during his hospitalization and they indicated that there was no acute intervention needed. He was maintained on Cipro throughout his hospital stay. He continued to spike intermittent fevers but at the time of discharge, was afebrile for greater than 24 hours. [name] stated that his pain increased gradually each day and he was feeling much better at the time of discharge. He was to continue Levaquin as indicated in the discharge medications.  #2 - BILIOUS: This resolved during his hospital stay.  #3 - INFECTED RENAL CYST: Please see #1 for further information.  #4 - ACUTE AND CHRONIC RENAL FAILURE: The patient presented with a creatinine of 4.6 which was increased from 2.5 on May of 2001. A renal consult was obtained which indicated that this was most likely acute and chronic renal failure secondary to volume depletion. IV fluid resuscitation was maintained  throughout the first several days of his hospital stay and his creatinine had slightly improved to 3.9 at the time of discharge. He is to be followed up with Dr. Lowell Guitar at [facility] and will most likely need dialysis sometime in the near future.  #5 - IRON DEFICIENCY ANEMIA: The patient presented with a hemoglobin of 11.5 and  hematocrit of 35.4 on admission. However, secondary to IV fluid resuscitation his H&H began to drop throughout his hospital stay. At the time of discharge, his H&H was 9.4 and 29.2. His MCV also remained on the low normal side at 82.9 at the time of discharge. His anemia studies on August 16, 2001 revealed iron level of 11 with normal being 42 to 135 and a total iron binding capacity of 198 with normal being 215 to 435. This gave him a percent saturation of 6 with normal being 28 to 55%. As a result, the renal service arranged for [name] to receive IV iron as an outpatient at [facility].  #6 - GOUT: During his hospital stay he began to complain of intermittent right foot pain which has been occurring for over one year. He states that it will hurt for a period of several days and then resolve. There is no swelling or erythema noted upon exam. However, he was exquisitely tender to palpation. Foot films were obtained on August 18, 2001 which were negative for any fractures. However, a uric acid level was obtained which was 10.8 on August 18, 2001 with normal being 2.4 to 7. Consequently, he was presumed to have gout and started on Colchicine 0.6 mg p.o. b.i.d.  DISCHARGE LABORATORY DATA: Parathyroid hormone 133.9 with normal being 14 to 17 with total calcium of 8.9.  END OF LIFE ISSUES: [name] is a full code.  FOLLOW-UP: The patient is to follow-up with Dr. Lowell Guitar at [facility] on Wednesday, September 14, 2001 at 1:30 p.m. He is to follow-up with Dr. Penni Bombard at the [facility] on Wednesday, August 31, 2001 at 2:00 p.m. Dictated by:   Rodolph Bong, M.D. Attending Physician:  Almetta Lovely DD:  08/22/01 TD:  08/24/01 Job: [id number]

## 2010-11-19 LAB — CBC
Platelets: 142 — ABNORMAL LOW
RBC: 5.29
WBC: 5.3

## 2010-11-19 LAB — URINALYSIS, ROUTINE W REFLEX MICROSCOPIC
Nitrite: NEGATIVE
Protein, ur: NEGATIVE
Urobilinogen, UA: 1

## 2010-11-19 LAB — BASIC METABOLIC PANEL
BUN: 17
Calcium: 9.8
Creatinine, Ser: 1.43
GFR calc Af Amer: >60

## 2010-11-19 LAB — DIFFERENTIAL
Basophils Relative: 0
Lymphs Abs: 0.9
Monocytes Relative: 6
Neutro Abs: 4
Neutrophils Relative %: 75

## 2010-11-20 LAB — GLUCOSE, CAPILLARY: Glucose-Capillary: 101 mg/dL — ABNORMAL HIGH (ref 70–99)

## 2010-11-27 LAB — BASIC METABOLIC PANEL
CO2: 26
Calcium: 9.3
Chloride: 97
Creatinine, Ser: 1.59 — ABNORMAL HIGH
GFR calc Af Amer: 59 — ABNORMAL LOW
Sodium: 131 — ABNORMAL LOW

## 2010-11-27 LAB — CBC
Hemoglobin: 14
MCHC: 32.3
RBC: 5.38
WBC: 3.9 — ABNORMAL LOW

## 2010-11-27 LAB — DIFFERENTIAL
Basophils Relative: 0
Eosinophils Absolute: 0
Eosinophils Relative: 1
Lymphocytes Relative: 31
Neutro Abs: 2.3
Neutrophils Relative %: 59

## 2011-02-02 ENCOUNTER — Ambulatory Visit (HOSPITAL_COMMUNITY)
Admission: RE | Admit: 2011-02-02 | Discharge: 2011-02-02 | Disposition: A | Discharge status: Home / Self Care | Payer: Medicare Other | Source: Ambulatory Visit | Attending: Family Medicine | Admitting: Family Medicine

## 2011-02-02 ENCOUNTER — Other Ambulatory Visit (HOSPITAL_COMMUNITY): Payer: Self-pay | Admitting: Family Medicine

## 2011-02-02 DIAGNOSIS — J189 Pneumonia, unspecified organism: Secondary | ICD-10-CM

## 2011-02-02 DIAGNOSIS — R0989 Other specified symptoms and signs involving the circulatory and respiratory systems: Secondary | ICD-10-CM | POA: Insufficient documentation

## 2011-02-02 DIAGNOSIS — R059 Cough, unspecified: Secondary | ICD-10-CM | POA: Insufficient documentation

## 2011-02-02 DIAGNOSIS — R05 Cough: Secondary | ICD-10-CM | POA: Insufficient documentation

## 2011-06-08 ENCOUNTER — Emergency Department (HOSPITAL_COMMUNITY)
Admission: EM | Admit: 2011-06-08 | Discharge: 2011-06-08 | Disposition: A | Discharge status: Home / Self Care | Payer: Medicare Other | Attending: Emergency Medicine | Admitting: Emergency Medicine

## 2011-06-08 ENCOUNTER — Encounter (HOSPITAL_COMMUNITY): Payer: Self-pay | Admitting: *Deleted

## 2011-06-08 ENCOUNTER — Emergency Department (HOSPITAL_COMMUNITY): Payer: Medicare Other

## 2011-06-08 DIAGNOSIS — R296 Repeated falls: Secondary | ICD-10-CM | POA: Insufficient documentation

## 2011-06-08 DIAGNOSIS — M545 Low back pain, unspecified: Secondary | ICD-10-CM | POA: Insufficient documentation

## 2011-06-08 DIAGNOSIS — E119 Type 2 diabetes mellitus without complications: Secondary | ICD-10-CM | POA: Insufficient documentation

## 2011-06-08 DIAGNOSIS — I10 Essential (primary) hypertension: Secondary | ICD-10-CM | POA: Insufficient documentation

## 2011-06-08 HISTORY — DX: Essential (primary) hypertension: I10

## 2011-06-08 MED ORDER — OXYCODONE-ACETAMINOPHEN 5-325 MG PO TABS: 1.0000 | ORAL_TABLET | Freq: Once | ORAL | Status: DC

## 2011-06-08 MED ORDER — OXYCODONE-ACETAMINOPHEN 5-325 MG PO TABS: 1.0000 | ORAL_TABLET | ORAL | Status: AC | PRN

## 2011-06-08 MED ORDER — OXYCODONE-ACETAMINOPHEN 5-325 MG PO TABS
1.0000 | ORAL_TABLET | Freq: Once | ORAL | Status: AC
  Administered 2011-06-08: 1 via ORAL
  Filled 2011-06-08: qty 1

## 2011-06-08 NOTE — ED Provider Notes (Signed)
History     CSN: [id number]  Arrival date & time 06/08/11  8295   First MD Initiated Contact with Patient 06/08/11 1950      Chief Complaint  Patient presents with  . Back Pain    (Consider location/radiation/quality/duration/timing/severity/associated sxs/prior treatment) HPI Comments: [name] presents for evaluation of low back pain after he fell attempting to ride his lawnmower up several or into the back of his pickup truck.  The board slid and he fell backwards landing on his lower back and the mower almost landed on top of him.  He was able one quarter of it off of him long enough for assistance to arrive.  He has persistent achy pain in his lower back without radiation.  Palpation and movement makes symptoms worse, rest improves the pain.  It is constant and he has taken no medicines or other treatment for resolution.  This injury happened just prior to arrival.  Patient is a 46 y.o. male presenting with back pain. The history is provided by the patient.  Back Pain  Pertinent negatives include no chest pain, no fever, no numbness, no headaches, no abdominal pain and no weakness.    Past Medical History  Diagnosis Date  . Diabetes mellitus   . Hypertension   . Renal disorder     Past Surgical History  Procedure Date  . Nephrectomy transplanted organ     History reviewed. No pertinent family history.  History  Substance Use Topics  . Smoking status: Never Smoker   . Smokeless tobacco: Not on file  . Alcohol Use: No      Review of Systems  Constitutional: Negative for fever.  HENT: Negative for congestion, sore throat and neck pain.   Eyes: Negative.   Respiratory: Negative for chest tightness and shortness of breath.   Cardiovascular: Negative for chest pain.  Gastrointestinal: Negative for nausea and abdominal pain.  Genitourinary: Negative.   Musculoskeletal: Positive for back pain. Negative for joint swelling and arthralgias.  Skin: Negative.   Negative for rash and wound.  Neurological: Negative for dizziness, weakness, light-headedness, numbness and headaches.  Hematological: Negative.   Psychiatric/Behavioral: Negative.     Allergies  Review of patient's allergies indicates no known allergies.  Home Medications   Current Outpatient Rx  Name Route Sig Dispense Refill  . OXYCODONE-ACETAMINOPHEN 5-325 MG PO TABS Oral Take 1 tablet by mouth once. 24 tablet 0    BP 124/97  Pulse 122  Temp(Src) 97.9 F (36.6 C) (Oral)  Resp 16  Ht 5\' 3"  (1.6 m)  Wt 178 lb (80.74 kg)  BMI 31.53 kg/m2  SpO2 100%  Physical Exam  Nursing note and vitals reviewed. Constitutional: He appears well-developed and well-nourished.  HENT:  Head: Normocephalic.  Eyes: Conjunctivae are normal.  Neck: Normal range of motion. Neck supple.  Cardiovascular: Normal rate and intact distal pulses.        Pedal pulses normal.  Pulmonary/Chest: Effort normal.  Abdominal: Soft. Bowel sounds are normal. He exhibits no distension and no mass.  Musculoskeletal: Normal range of motion. He exhibits tenderness. He exhibits no edema.       Lumbar back: He exhibits tenderness. He exhibits no swelling, no edema and no spasm.       Back:  Neurological: He is alert. He has normal strength. He displays no atrophy and no tremor. No sensory deficit. Gait normal.  Reflex Scores:      Patellar reflexes are 2+ on the right side and 2+ on  the left side.      Achilles reflexes are 2+ on the right side and 2+ on the left side.      No strength deficit noted in hip and knee flexor and extensor muscle groups.  Ankle flexion and extension intact.  Skin: Skin is warm and dry.  Psychiatric: He has a normal mood and affect.    ED Course  Procedures (including critical care time)  Labs Reviewed - No data to display Dg Lumbar Spine Complete  06/08/2011  *RADIOLOGY REPORT*  Clinical Data: Pain post blunt trauma.  Polycystic kidney disease, post renal transplant.  LUMBAR  SPINE - COMPLETE 4+ VIEW  Comparison: 03/09/2010  Findings: Mild facet degenerative changes bilaterally L5-S1. Negative for fracture, dislocation, or other acute bony abnormality.  Vascular clips in the right pelvis.  IMPRESSION:  1.  Negative for fracture or other acute abnormality. 2.  Early facet degenerative changes L5-S1.  Original Report Authenticated By: Thora Lance III, M.D.     1. Pain in lower back       MDM  Contusion of lumbar/sacrum.  No neuro deficit on exam or by history to suggest emergent or surgical presentation.  Xrays reviewed prior to dc home.           Candis Musa, PA 06/09/11 1128

## 2011-06-08 NOTE — ED Notes (Signed)
Low back pain after riding mower fell on pt

## 2011-06-08 NOTE — Discharge Instructions (Signed)
Back Pain, Adult Low back pain is very common. About 1 in 5 people have back pain.The cause of low back pain is rarely dangerous. The pain often gets better over time.About half of people with a sudden onset of back pain feel better in just 2 weeks. About 8 in 10 people feel better by 6 weeks.  CAUSES Some common causes of back pain include:  Strain of the muscles or ligaments supporting the spine.   Wear and tear (degeneration) of the spinal discs.   Arthritis.   Direct injury to the back.  DIAGNOSIS Most of the time, the direct cause of low back pain is not known.However, back pain can be treated effectively even when the exact cause of the pain is unknown.Answering your caregiver's questions about your overall health and symptoms is one of the most accurate ways to make sure the cause of your pain is not dangerous. If your caregiver needs more information, he or she may order lab work or imaging tests (X-rays or MRIs).However, even if imaging tests show changes in your back, this usually does not require surgery. HOME CARE INSTRUCTIONS For many people, back pain returns.Since low back pain is rarely dangerous, it is often a condition that people can learn to manageon their own.   Remain active. It is stressful on the back to sit or stand in one place. Do not sit, drive, or stand in one place for more than 30 minutes at a time. Take short walks on level surfaces as soon as pain allows.Try to increase the length of time you walk each day.   Do not stay in bed.Resting more than 1 or 2 days can delay your recovery.   Do not avoid exercise or work.Your body is made to move.It is not dangerous to be active, even though your back may hurt.Your back will likely heal faster if you return to being active before your pain is gone.   Pay attention to your body when you bend and lift. Many people have less discomfortwhen lifting if they bend their knees, keep the load close to their  bodies,and avoid twisting. Often, the most comfortable positions are those that put less stress on your recovering back.   Find a comfortable position to sleep. Use a firm mattress and lie on your side with your knees slightly bent. If you lie on your back, put a pillow under your knees.   Only take over-the-counter or prescription medicines as directed by your caregiver. Over-the-counter medicines to reduce pain and inflammation are often the most helpful.Your caregiver may prescribe muscle relaxant drugs.These medicines help dull your pain so you can more quickly return to your normal activities and healthy exercise.   Put ice on the injured area.   Put ice in a plastic bag.   Place a towel between your skin and the bag.   Leave the ice on for 15 to 20 minutes, 3 to 4 times a day for the first 2 to 3 days. After that, ice and heat may be alternated to reduce pain and spasms.   Ask your caregiver about trying back exercises and gentle massage. This may be of some benefit.   Avoid feeling anxious or stressed.Stress increases muscle tension and can worsen back pain.It is important to recognize when you are anxious or stressed and learn ways to manage it.Exercise is a great option.  SEEK MEDICAL CARE IF:  You have pain that is not relieved with rest or medicine.   You have   pain that does not improve in 1 week.   You have new symptoms.   You are generally not feeling well.  SEEK IMMEDIATE MEDICAL CARE IF:   You have pain that radiates from your back into your legs.   You develop new bowel or bladder control problems.   You have unusual weakness or numbness in your arms or legs.   You develop nausea or vomiting.   You develop abdominal pain.   You feel faint.  Document Released: 02/02/2005 Document Revised: 01/22/2011 Document Reviewed: 06/23/2010 [facility] Patient Information 2012 [place], [place].  You may take the oxycodone prescribed for pain relief.  This will make you  drowsy - do not drive within 4 hours of taking this medication.

## 2011-06-13 NOTE — ED Provider Notes (Signed)
Medical screening examination/treatment/procedure(s) were performed by non-physician practitioner and as supervising physician I was immediately available for consultation/collaboration.   Shelda Jakes, MD 06/13/11 2112

## 2011-10-25 ENCOUNTER — Encounter (HOSPITAL_COMMUNITY): Payer: Self-pay | Admitting: *Deleted

## 2011-10-25 ENCOUNTER — Emergency Department (HOSPITAL_COMMUNITY): Payer: Medicare Other

## 2011-10-25 ENCOUNTER — Emergency Department (HOSPITAL_COMMUNITY)
Admission: EM | Admit: 2011-10-25 | Discharge: 2011-10-25 | Disposition: A | Discharge status: Home / Self Care | Payer: Medicare Other | Attending: Emergency Medicine | Admitting: Emergency Medicine

## 2011-10-25 DIAGNOSIS — R079 Chest pain, unspecified: Secondary | ICD-10-CM | POA: Insufficient documentation

## 2011-10-25 DIAGNOSIS — Z794 Long term (current) use of insulin: Secondary | ICD-10-CM | POA: Insufficient documentation

## 2011-10-25 DIAGNOSIS — R0602 Shortness of breath: Secondary | ICD-10-CM | POA: Insufficient documentation

## 2011-10-25 DIAGNOSIS — R0789 Other chest pain: Secondary | ICD-10-CM

## 2011-10-25 DIAGNOSIS — E119 Type 2 diabetes mellitus without complications: Secondary | ICD-10-CM | POA: Insufficient documentation

## 2011-10-25 DIAGNOSIS — R071 Chest pain on breathing: Secondary | ICD-10-CM | POA: Insufficient documentation

## 2011-10-25 DIAGNOSIS — I10 Essential (primary) hypertension: Secondary | ICD-10-CM | POA: Insufficient documentation

## 2011-10-25 HISTORY — DX: Other intervertebral disc degeneration, lumbar region without mention of lumbar back pain or lower extremity pain: M51.369

## 2011-10-25 HISTORY — DX: Polycystic kidney, unspecified: Q61.3

## 2011-10-25 HISTORY — DX: Other intervertebral disc degeneration, lumbar region: M51.36

## 2011-10-25 LAB — BASIC METABOLIC PANEL
CO2: 24 mEq/L (ref 19–32)
Calcium: 9.9 mg/dL (ref 8.4–10.5)
Creatinine, Ser: 1.26 mg/dL (ref 0.50–1.35)
Glucose, Bld: 121 mg/dL — ABNORMAL HIGH (ref 70–99)

## 2011-10-25 LAB — CBC
HCT: 43.6 % (ref 39.0–52.0)
MCHC: 31.9 g/dL (ref 30.0–36.0)
MCV: 83 fL (ref 78.0–100.0)
RDW: 13.9 % (ref 11.5–15.5)

## 2011-10-25 MED ORDER — OXYCODONE-ACETAMINOPHEN 5-325 MG PO TABS
1.0000 | ORAL_TABLET | Freq: Once | ORAL | Status: AC
  Administered 2011-10-25: 1 via ORAL
  Filled 2011-10-25: qty 1

## 2011-10-25 MED ORDER — METHOCARBAMOL 500 MG PO TABS
1000.0000 mg | ORAL_TABLET | Freq: Four times a day (QID) | ORAL | Status: AC | PRN
Start: 2011-10-25 — End: 2011-11-04

## 2011-10-25 MED ORDER — HYDROCODONE-ACETAMINOPHEN 5-325 MG PO TABS: ORAL_TABLET | ORAL | Status: AC

## 2011-10-25 NOTE — ED Notes (Signed)
Pt c/o left side chest pain that is non radiating, sharp in nature, pain increases with movement, respiration that started Thursday evening, pt has been taking muscle relaxer and aleve  without imrpovement

## 2011-10-25 NOTE — ED Provider Notes (Signed)
History     CSN: [id number]  Arrival date & time 10/25/11  1356   First MD Initiated Contact with Patient 10/25/11 1431      Chief Complaint  Patient presents with  . Chest Pain     HPI Pt was seen at 1515.  Per pt, c/o gradual onset and persistence of constant left upper chest wall "pain: that began 3 days ago.  Pt describes the pain as "sharp," worsens with movement and palpation of the area.  States he "might have lifted something heavy" the day before symptoms began.  Denies SOB/cough, no palpitations, no abd pain, no back pain, no tingling/numbness in extremities, no focal wotor weakness, no fevers, no rash.    Past Medical History  Diagnosis Date  . Diabetes mellitus   . Hypertension   . Renal disorder     Past Surgical History  Procedure Date  . Nephrectomy transplanted organ     History  Substance Use Topics  . Smoking status: Never Smoker   . Smokeless tobacco: Not on file  . Alcohol Use: No      Review of Systems ROS: Statement: All systems negative except as marked or noted in the HPI; Constitutional: Negative for fever and chills. ; ; Eyes: Negative for eye pain, redness and discharge. ; ; ENMT: Negative for ear pain, hoarseness, nasal congestion, sinus pressure and sore throat. ; ; Cardiovascular: +chest pain. Negative for palpitations, diaphoresis, dyspnea and peripheral edema. ; ; Respiratory: Negative for cough, wheezing and stridor. ; ; Gastrointestinal: Negative for nausea, vomiting, diarrhea, abdominal pain, blood in stool, hematemesis, jaundice and rectal bleeding. . ; ; Genitourinary: Negative for dysuria, flank pain and hematuria. ; ; Musculoskeletal: Negative for back pain and neck pain. Negative for swelling and trauma.; ; Skin: Negative for pruritus, rash, abrasions, blisters, bruising and skin lesion.; ; Neuro: Negative for headache, lightheadedness and neck stiffness. Negative for weakness, altered level of consciousness , altered mental status,  extremity weakness, paresthesias, involuntary movement, seizure and syncope.       Allergies  Review of patient's allergies indicates no known allergies.  Home Medications   Current Outpatient Rx  Name Route Sig Dispense Refill  . ASPIRIN EC 81 MG PO TBEC Oral Take 81 mg by mouth daily.    . INSULIN ASPART 100 UNIT/ML [place] SOLN Subcutaneous Inject 15 Units into the skin 3 (three) times daily before meals. As directed/needed per sliding scale    . INSULIN GLARGINE 100 UNIT/ML [place] SOLN Subcutaneous Inject 36 Units into the skin at bedtime.    Marland Kitchen MYCOPHENOLATE MOFETIL 250 MG PO CAPS Oral Take 1,000 mg by mouth 2 (two) times daily.    Marland Kitchen PREDNISONE 5 MG PO TABS Oral Take 5 mg by mouth daily.    Marland Kitchen TACROLIMUS 1 MG PO CAPS Oral Take 2 mg by mouth 2 (two) times daily.    Marland Kitchen UNKNOWN TO PATIENT Oral Take 1 tablet by mouth daily. For blood pressure    . UNKNOWN TO PATIENT Oral Take 1 tablet by mouth daily. For blood pressure      BP 125/88  Pulse 92  Temp 97.4 F (36.3 C)  Resp 18  Ht 5\' 5"  (1.651 m)  Wt 178 lb (80.74 kg)  BMI 29.62 kg/m2  SpO2 96%  Physical Exam 1520: Physical examination:  Nursing notes reviewed; Vital signs and O2 SAT reviewed;  Constitutional: Well developed, Well nourished, Well hydrated, In no acute distress; Head:  Normocephalic, atraumatic; Eyes: EOMI, PERRL, No  scleral icterus; ENMT: Mouth and pharynx normal, Mucous membranes moist; Neck: Supple, Full range of motion, No lymphadenopathy; Cardiovascular: Regular rate and rhythm, No murmur, rub, or gallop; Respiratory: Breath sounds clear & equal bilaterally, No rales, rhonchi, wheezes.  Speaking full sentences with ease, Normal respiratory effort/excursion; Chest: +TTP left upper anterior chest wall area which reproduces pt's symptoms.  No soft tissue crepitus. No deformity. Movement normal; Abdomen: Soft, Nontender, Nondistended, Normal bowel sounds;; Extremities: Pulses normal, No tenderness, No edema, No calf edema or  asymmetry.; Neuro: AA&Ox3, Major CN grossly intact.  Speech clear. No gross focal motor or sensory deficits in extremities.; Skin: Color normal, Warm, Dry.   ED Course  Procedures   MDM  MDM Reviewed: nursing note and vitals Reviewed previous: ECG Interpretation: ECG, labs and x-ray    Date: 10/25/2011  Rate: 80  Rhythm: normal sinus rhythm  QRS Axis: normal  Intervals: normal  ST/T Wave abnormalities: normal  Conduction Disutrbances:none  Narrative Interpretation:   Old EKG Reviewed:  Today's EKG improved from previous EKG dated 12/10/2008 (had non-specific STTW changes).  Results for orders placed during the hospital encounter of 10/25/11  BASIC METABOLIC PANEL      Component Value Range   Sodium 136  135 - 145 mEq/L   Potassium 3.6  3.5 - 5.1 mEq/L   Chloride 104  96 - 112 mEq/L   CO2 24  19 - 32 mEq/L   Glucose, Bld 121 (*) 70 - 99 mg/dL   BUN 23  6 - 23 mg/dL   Creatinine, Ser 1.61  0.50 - 1.35 mg/dL   Calcium 9.9  8.4 - 09.6 mg/dL   GFR calc non Af Amer 67 (*) >90 mL/min   GFR calc Af Amer 78 (*) >90 mL/min  D-DIMER, QUANTITATIVE      Component Value Range   D-Dimer, Quant 0.23  0.00 - 0.48 ug/mL-FEU  CBC      Component Value Range   WBC 6.8  4.0 - 10.5 K/uL   RBC 5.25  4.22 - 5.81 MIL/uL   Hemoglobin 13.9  13.0 - 17.0 g/dL   HCT 04.5  40.9 - 81.1 %   MCV 83.0  78.0 - 100.0 fL   MCH 26.5  26.0 - 34.0 pg   MCHC 31.9  30.0 - 36.0 g/dL   RDW 91.4  78.2 - 95.6 %   Platelets 137 (*) 150 - 400 K/uL  TROPONIN I      Component Value Range   Troponin I <0.30  <0.30 ng/mL    Dg Chest 2 View 10/25/2011  *RADIOLOGY REPORT*  Clinical Data: Chest pain and shortness of breath.  CHEST - 2 VIEW  Comparison: 02/02/2011  Findings: The cardiac silhouette, mediastinal and hilar contours are within normal limits and stable.  Slightly low lung volumes with mild vascular crowding and streaky basilar atelectasis.  No infiltrates, edema or effusions.  The bony thorax is intact   IMPRESSION: No acute cardiopulmonary findings.   Original Report Authenticated By: P. Loralie Champagne, M.D.      1700:  VSS, improved after meds, wants to go home now.  Doubt ACS with constant pain x3 days, negative troponin and normal EKG.  Doubt PE with normal d-dimer, low risk [place].  Dx testing d/w pt and family.  Questions answered.  Verb understanding, agreeable to d/c home with outpt f/u.           Laray Anger, DO 10/28/11 1725

## 2011-10-26 ENCOUNTER — Encounter (HOSPITAL_COMMUNITY): Payer: Self-pay | Admitting: Emergency Medicine

## 2011-12-07 ENCOUNTER — Emergency Department (HOSPITAL_COMMUNITY): Payer: Medicare Other

## 2011-12-07 ENCOUNTER — Encounter (HOSPITAL_COMMUNITY): Payer: Self-pay | Admitting: *Deleted

## 2011-12-07 ENCOUNTER — Emergency Department (HOSPITAL_COMMUNITY)
Admission: EM | Admit: 2011-12-07 | Discharge: 2011-12-07 | Disposition: A | Discharge status: Home / Self Care | Payer: Medicare Other | Attending: Emergency Medicine | Admitting: Emergency Medicine

## 2011-12-07 DIAGNOSIS — M5137 Other intervertebral disc degeneration, lumbosacral region: Secondary | ICD-10-CM | POA: Insufficient documentation

## 2011-12-07 DIAGNOSIS — Q613 Polycystic kidney, unspecified: Secondary | ICD-10-CM | POA: Insufficient documentation

## 2011-12-07 DIAGNOSIS — Z794 Long term (current) use of insulin: Secondary | ICD-10-CM | POA: Insufficient documentation

## 2011-12-07 DIAGNOSIS — E119 Type 2 diabetes mellitus without complications: Secondary | ICD-10-CM | POA: Insufficient documentation

## 2011-12-07 DIAGNOSIS — M51379 Other intervertebral disc degeneration, lumbosacral region without mention of lumbar back pain or lower extremity pain: Secondary | ICD-10-CM | POA: Insufficient documentation

## 2011-12-07 DIAGNOSIS — J189 Pneumonia, unspecified organism: Secondary | ICD-10-CM | POA: Insufficient documentation

## 2011-12-07 DIAGNOSIS — I10 Essential (primary) hypertension: Secondary | ICD-10-CM | POA: Insufficient documentation

## 2011-12-07 DIAGNOSIS — IMO0002 Reserved for concepts with insufficient information to code with codable children: Secondary | ICD-10-CM | POA: Insufficient documentation

## 2011-12-07 MED ORDER — ALBUTEROL SULFATE HFA 108 (90 BASE) MCG/ACT IN AERS
2.0000 | INHALATION_SPRAY | RESPIRATORY_TRACT | Status: DC
  Administered 2011-12-07: 2 via RESPIRATORY_TRACT
  Filled 2011-12-07 (×2): qty 6.7

## 2011-12-07 MED ORDER — HYDROCOD POLST-CHLORPHEN POLST 10-8 MG/5ML PO LQCR: 5.0000 mL | Freq: Two times a day (BID) | ORAL | Status: DC | PRN

## 2011-12-07 MED ORDER — ACETAMINOPHEN 500 MG PO TABS
ORAL_TABLET | ORAL | Status: AC
  Administered 2011-12-07: 1000 mg
  Filled 2011-12-07: qty 2

## 2011-12-07 MED ORDER — LIDOCAINE HCL (PF) 1 % IJ SOLN
INTRAMUSCULAR | Status: AC
  Administered 2011-12-07: 2.1 mL
  Filled 2011-12-07: qty 5

## 2011-12-07 MED ORDER — CEFTRIAXONE SODIUM 1 G IJ SOLR
1.0000 g | Freq: Once | INTRAMUSCULAR | Status: AC
  Administered 2011-12-07: 1 g via INTRAMUSCULAR
  Filled 2011-12-07: qty 10

## 2011-12-07 MED ORDER — AZITHROMYCIN 250 MG PO TABS
500.0000 mg | ORAL_TABLET | Freq: Once | ORAL | Status: AC
  Administered 2011-12-07: 500 mg via ORAL
  Filled 2011-12-07: qty 2

## 2011-12-07 MED ORDER — AZITHROMYCIN 250 MG PO TABS: 250.0000 mg | ORAL_TABLET | Freq: Every day | ORAL | Status: DC

## 2011-12-07 MED ORDER — HYDROCOD POLST-CHLORPHEN POLST 10-8 MG/5ML PO LQCR
5.0000 mL | Freq: Once | ORAL | Status: AC
  Administered 2011-12-07: 5 mL via ORAL
  Filled 2011-12-07: qty 5

## 2011-12-07 NOTE — ED Notes (Signed)
Patient with no complaints at this time. Respirations even and unlabored. Skin warm/dry. Discharge instructions reviewed with patient at this time. Patient given opportunity to voice concerns/ask questions. Patient discharged at this time and left Emergency Department with steady gait.   

## 2011-12-07 NOTE — ED Provider Notes (Signed)
History     CSN: [id number]  Arrival date & time 12/07/11  1224   First MD Initiated Contact with Patient 12/07/11 1252      Chief Complaint  Patient presents with  . Fever    (Consider location/radiation/quality/duration/timing/severity/associated sxs/prior treatment) Patient is a 46 y.o. male presenting with fever. The history is provided by the patient.  Fever Primary symptoms of the febrile illness include fever, fatigue, cough and myalgias. Primary symptoms do not include wheezing, shortness of breath, abdominal pain, dysuria or arthralgias. The current episode started 3 to 5 days ago. This is a new problem. The problem has not changed since onset. The cough began 2 days ago. The cough is productive. The sputum is green. Cough worsened by: nothing.  Risk factors: none.Primary symptoms comment: back pain    Past Medical History  Diagnosis Date  . Diabetes mellitus   . Hypertension   . Polycystic kidney disease   . DDD (degenerative disc disease), lumbar     Past Surgical History  Procedure Date  . Nephrectomy transplanted organ     History reviewed. No pertinent family history.  History  Substance Use Topics  . Smoking status: Never Smoker   . Smokeless tobacco: Not on file  . Alcohol Use: Yes     rarely      Review of Systems  Constitutional: Positive for fever and fatigue. Negative for activity change.       All ROS Neg except as noted in HPI  HENT: Negative for nosebleeds and neck pain.   Eyes: Negative for photophobia and discharge.  Respiratory: Positive for cough. Negative for shortness of breath and wheezing.   Cardiovascular: Negative for chest pain and palpitations.  Gastrointestinal: Negative for abdominal pain and blood in stool.  Genitourinary: Negative for dysuria, frequency and hematuria.  Musculoskeletal: Positive for myalgias. Negative for back pain and arthralgias.  Skin: Negative.   Neurological: Negative for dizziness, seizures and  speech difficulty.  Psychiatric/Behavioral: Negative for hallucinations and confusion.    Allergies  Review of patient's allergies indicates no known allergies.  Home Medications   Current Outpatient Rx  Name Route Sig Dispense Refill  . ASPIRIN EC 81 MG PO TBEC Oral Take 81 mg by mouth daily.    . INSULIN ASPART 100 UNIT/ML [place] SOLN Subcutaneous Inject 15 Units into the skin 3 (three) times daily before meals. As directed/needed per sliding scale    . INSULIN GLARGINE 100 UNIT/ML [place] SOLN Subcutaneous Inject 36 Units into the skin at bedtime.    Marland Kitchen MYCOPHENOLATE MOFETIL 250 MG PO CAPS Oral Take 1,000 mg by mouth 2 (two) times daily.    Marland Kitchen PREDNISONE 5 MG PO TABS Oral Take 5 mg by mouth daily.    Marland Kitchen TACROLIMUS 1 MG PO CAPS Oral Take 2 mg by mouth 2 (two) times daily.    Marland Kitchen UNKNOWN TO PATIENT Oral Take 1 tablet by mouth daily. For blood pressure    . UNKNOWN TO PATIENT Oral Take 1 tablet by mouth daily. For blood pressure      BP 126/88  Pulse 109  Temp 101.9 F (38.8 C) (Oral)  Resp 20  Ht 5\' 5"  (1.651 m)  Wt 179 lb (81.194 kg)  BMI 29.79 kg/m2  SpO2 100%  Physical Exam  Nursing note and vitals reviewed. Constitutional: He is oriented to person, place, and time. He appears well-developed and well-nourished.  Non-toxic appearance. He has a sickly appearance.  HENT:  Head: Normocephalic.  Right Ear: Tympanic  membrane and external ear normal.  Left Ear: Tympanic membrane and external ear normal.  Eyes: EOM and lids are normal. Pupils are equal, round, and reactive to light.  Neck: Normal range of motion. Neck supple. Carotid bruit is not present.  Cardiovascular: Regular rhythm, normal heart sounds, intact distal pulses and normal pulses.  Tachycardia present.   Pulmonary/Chest: No respiratory distress. He has rhonchi.       Course breath sounds with scattered rhonchi.  Abdominal: Soft. Bowel sounds are normal. There is no tenderness. There is no guarding.  Musculoskeletal: Normal  range of motion.  Lymphadenopathy:       Head (right side): No submandibular adenopathy present.       Head (left side): No submandibular adenopathy present.    He has no cervical adenopathy.  Neurological: He is alert and oriented to person, place, and time. He has normal strength. No cranial nerve deficit or sensory deficit.  Skin: Skin is warm and dry.  Psychiatric: He has a normal mood and affect. His speech is normal.    ED Course  Procedures (including critical care time)  Labs Reviewed - No data to display No results found.   No diagnosis found.    MDM  I have reviewed nursing notes, vital signs, and all appropriate lab and imaging results for this patient. Xray is pos. For right mid lung pneumonia. Pulse ox stable. Rx for albuterol, zithromax, and Tussionex given to the patient. Pt to be rechecked on Friday 10/25.       Kathie Dike, [place] 12/07/11 2224

## 2011-12-07 NOTE — ED Notes (Signed)
Fever, back pain, cough with green sputum.    Body aches.

## 2011-12-17 NOTE — ED Provider Notes (Signed)
Medical screening examination/treatment/procedure(s) were performed by non-physician practitioner and as supervising physician I was immediately available for consultation/collaboration. Devoria Albe, MD, Armando Gang   Ward Givens, MD 12/17/11 [phone]

## 2012-05-17 ENCOUNTER — Ambulatory Visit
Admission: RE | Admit: 2012-05-17 | Discharge: 2012-05-17 | Disposition: A | Discharge status: Home / Self Care | Payer: Medicare Other | Source: Ambulatory Visit | Attending: Nephrology | Admitting: Nephrology

## 2012-05-17 ENCOUNTER — Other Ambulatory Visit: Payer: Self-pay | Admitting: Nephrology

## 2012-05-17 DIAGNOSIS — R05 Cough: Secondary | ICD-10-CM

## 2012-09-16 ENCOUNTER — Encounter (HOSPITAL_COMMUNITY): Payer: Self-pay | Admitting: *Deleted

## 2012-09-16 ENCOUNTER — Emergency Department (HOSPITAL_COMMUNITY)
Admission: EM | Admit: 2012-09-16 | Discharge: 2012-09-16 | Disposition: A | Discharge status: Home / Self Care | Payer: Medicaid Other | Attending: Emergency Medicine | Admitting: Emergency Medicine

## 2012-09-16 DIAGNOSIS — Z8739 Personal history of other diseases of the musculoskeletal system and connective tissue: Secondary | ICD-10-CM | POA: Insufficient documentation

## 2012-09-16 DIAGNOSIS — M549 Dorsalgia, unspecified: Secondary | ICD-10-CM

## 2012-09-16 DIAGNOSIS — R52 Pain, unspecified: Secondary | ICD-10-CM | POA: Insufficient documentation

## 2012-09-16 DIAGNOSIS — M545 Low back pain, unspecified: Secondary | ICD-10-CM | POA: Insufficient documentation

## 2012-09-16 DIAGNOSIS — E119 Type 2 diabetes mellitus without complications: Secondary | ICD-10-CM | POA: Insufficient documentation

## 2012-09-16 DIAGNOSIS — I10 Essential (primary) hypertension: Secondary | ICD-10-CM | POA: Insufficient documentation

## 2012-09-16 DIAGNOSIS — Z7982 Long term (current) use of aspirin: Secondary | ICD-10-CM | POA: Insufficient documentation

## 2012-09-16 DIAGNOSIS — G8929 Other chronic pain: Secondary | ICD-10-CM | POA: Insufficient documentation

## 2012-09-16 DIAGNOSIS — Z87798 Personal history of other (corrected) congenital malformations: Secondary | ICD-10-CM | POA: Insufficient documentation

## 2012-09-16 DIAGNOSIS — L03211 Cellulitis of face: Secondary | ICD-10-CM | POA: Insufficient documentation

## 2012-09-16 DIAGNOSIS — Z794 Long term (current) use of insulin: Secondary | ICD-10-CM | POA: Insufficient documentation

## 2012-09-16 DIAGNOSIS — Z79899 Other long term (current) drug therapy: Secondary | ICD-10-CM | POA: Insufficient documentation

## 2012-09-16 DIAGNOSIS — L0201 Cutaneous abscess of face: Secondary | ICD-10-CM | POA: Insufficient documentation

## 2012-09-16 MED ORDER — LIDOCAINE HCL (PF) 2 % IJ SOLN
2.0000 mL | Freq: Once | INTRAMUSCULAR | Status: AC
  Administered 2012-09-16: 2 mL
  Filled 2012-09-16: qty 10

## 2012-09-16 MED ORDER — HYDROCODONE-ACETAMINOPHEN 5-325 MG PO TABS
1.0000 | ORAL_TABLET | Freq: Once | ORAL | Status: AC
  Administered 2012-09-16: 1 via ORAL
  Filled 2012-09-16: qty 1

## 2012-09-16 MED ORDER — SULFAMETHOXAZOLE-TMP DS 800-160 MG PO TABS
1.0000 | ORAL_TABLET | Freq: Once | ORAL | Status: AC
  Administered 2012-09-16: 1 via ORAL
  Filled 2012-09-16: qty 1

## 2012-09-16 MED ORDER — SULFAMETHOXAZOLE-TRIMETHOPRIM 800-160 MG PO TABS: 1.0000 | ORAL_TABLET | Freq: Two times a day (BID) | ORAL | Status: AC

## 2012-09-16 MED ORDER — HYDROCODONE-ACETAMINOPHEN 5-325 MG PO TABS: 1.0000 | ORAL_TABLET | ORAL | Status: DC | PRN

## 2012-09-16 NOTE — ED Provider Notes (Signed)
CSN: [id number]     Arrival date & time 09/16/12  1653 History     First MD Initiated Contact with Patient 09/16/12 1723     Chief Complaint  Patient presents with  . Facial Swelling  . Back Pain   (Consider location/radiation/quality/duration/timing/severity/associated sxs/prior Treatment) HPI Comments: [name] is a 47 y.o. Male presenting with 2 complaints, the first being redness, swelling and scant drainage of pus from a presumptive insect bite at his left medial eyebrow.  He describes working outdoors 4 days ago when he swatted an insect from his eyebrow,  Afterward, felt 2 small bumps which were initially itchy,  But now are painful.  He woke this am with his left upper eyelid fairly swollen, but has improved throughout the day.  He denies pain with eye movement and denies fevers or chills, nausea, vomiting and otherwise feels well,  Except he is also experiencing acute on chronic low back pain since yesterday.  Patient denies any new injury specifically.  There is no radiation into his lower extremities.  There has been no weakness or numbness in the lower extremities and no urinary or bowel retention or incontinence.  Patient does not have a history of cancer or IVDU.   The history is provided by the patient.    Past Medical History  Diagnosis Date  . Diabetes mellitus   . Hypertension   . Polycystic kidney disease   . DDD (degenerative disc disease), lumbar    Past Surgical History  Procedure Laterality Date  . Nephrectomy transplanted organ     History reviewed. No pertinent family history. History  Substance Use Topics  . Smoking status: Never Smoker   . Smokeless tobacco: Not on file  . Alcohol Use: Yes     Comment: rarely    Review of Systems  Constitutional: Negative for fever.  Respiratory: Negative for shortness of breath.   Cardiovascular: Negative for chest pain and leg swelling.  Gastrointestinal: Negative for abdominal pain, constipation and  abdominal distention.  Genitourinary: Negative for dysuria, urgency, frequency, flank pain and difficulty urinating.  Musculoskeletal: Positive for back pain. Negative for joint swelling and gait problem.  Skin: Positive for wound.  Neurological: Negative for weakness and numbness.    Allergies  Review of patient's allergies indicates no known allergies.  Home Medications   Current Outpatient Rx  Name  Route  Sig  Dispense  Refill  . aspirin EC 81 MG tablet   Oral   Take 81 mg by mouth daily.         . insulin aspart (NOVOLOG FLEXPEN) 100 UNIT/ML injection   Subcutaneous   Inject 15 Units into the skin 3 (three) times daily before meals. As directed/needed per sliding scale         . insulin glargine (LANTUS SOLOSTAR) 100 UNIT/ML injection   Subcutaneous   Inject 36 Units into the skin at bedtime.         . mycophenolate (CELLCEPT) 250 MG capsule   Oral   Take 1,000 mg by mouth 2 (two) times daily.         . predniSONE (DELTASONE) 5 MG tablet   Oral   Take 5 mg by mouth daily.         . tacrolimus (PROGRAF) 1 MG capsule   Oral   Take 2 mg by mouth 2 (two) times daily.         Marland Kitchen tetrahydrozoline-zinc (VISINE-AC) 0.05-0.25 % ophthalmic solution   Both Eyes  Place 2 drops into both eyes 3 (three) times daily as needed. Dry/Red Eyes         . HYDROcodone-acetaminophen (NORCO/VICODIN) 5-325 MG per tablet   Oral   Take 1 tablet by mouth every 4 (four) hours as needed for pain.   15 tablet   0   . sulfamethoxazole-trimethoprim (BACTRIM DS,SEPTRA DS) 800-160 MG per tablet   Oral   Take 1 tablet by mouth 2 (two) times daily.   20 tablet   0    BP 127/89  Pulse 91  Temp(Src) 98 F (36.7 C) (Oral)  Resp 19  Ht 5\' 5"  (1.651 m)  Wt 178 lb (80.74 kg)  BMI 29.62 kg/m2  SpO2 98% Physical Exam  Nursing note and vitals reviewed. Constitutional: He appears well-developed and well-nourished. No distress.  HENT:  Mouth/Throat: Oropharynx is clear and  moist.  Edema, fluctuance and erythema medial left brow with an area of crusted appearing excoriation within the brow line.  He has faint erythema and edema of upper left eyelid and outer lateral canthus.  No proptosis,  No pain with eye movement.  Eyes: Conjunctivae are normal.  Neck: Normal range of motion. Neck supple.  Cardiovascular: Normal rate and intact distal pulses.   Pedal pulses normal.  Pulmonary/Chest: Effort normal. He has no wheezes.  Abdominal: Soft. Bowel sounds are normal. He exhibits no distension and no mass.  Musculoskeletal: Normal range of motion. He exhibits no edema.       Lumbar back: He exhibits tenderness. He exhibits no swelling, no edema and no spasm.  Neurological: He is alert. He has normal strength. He displays no atrophy and no tremor. No sensory deficit. Gait normal.  Reflex Scores:      Patellar reflexes are 2+ on the right side and 2+ on the left side.      Achilles reflexes are 2+ on the right side and 2+ on the left side. No strength deficit noted in hip and knee flexor and extensor muscle groups.  Ankle flexion and extension intact.  Skin: Skin is warm and dry. Rash noted.  Psychiatric: He has a normal mood and affect.    ED Course   Procedures (including critical care time)  INCISION AND DRAINAGE Performed by: Burgess Amor Consent: Verbal consent obtained. Risks and benefits: risks, benefits and alternatives were discussed Type: abscess  Body area: left eyebrow  Anesthesia: local infiltration  Incision was made with a scalpel.  Local anesthetic: lidocaine 2% without epinephrine  Anesthetic total: 1 ml  Complexity: complex Blunt dissection to break up loculations  Drainage: purulent  Drainage amount: blood only,  No purulence  Packing material: no packing. Patient tolerance: Patient tolerated the procedure well with no immediate complications.     Labs Reviewed - No data to display No results found. 1. Facial abscess   2.  Chronic back pain     MDM  Pt was also seen by Dr Juleen China prior to dc home.  Pt was placed on bactrim,  Encouraged warm compresses,  Recheck for any worsened pain,  Swelling or spreading redness.    No neuro deficit on exam or by history to suggest emergent or surgical presentation regarding low back pain.  Also discussed worsened sx that should prompt immediate re-evaluation including distal weakness, bowel/bladder retention/incontinence.  Hydrocodone prescribed.        Burgess Amor, PA-C 09/16/12 1836
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
## 2012-09-22 NOTE — ED Provider Notes (Signed)
Medical screening examination/treatment/procedure(s) were conducted as a shared visit with non-physician practitioner(s) and myself.  I personally evaluated the patient during the encounter.  Patient has what appears to be a small abscess to the region of the left eyebrow. Minimal left periorbital edema. No signs of ocular involvement or periorbital cellulitis. No pain with extraocular muscle movement. No proptosis.  Consideration was given to patient's immunosuppressive therapy and diabetes. Provided with a prescription for antibiotics because of this although clinically this appears to be a small simple abscess.  Raeford Razor, MD 09/22/12 [phone]

## 2014-04-22 ENCOUNTER — Emergency Department (HOSPITAL_COMMUNITY)
Admission: EM | Admit: 2014-04-22 | Discharge: 2014-04-22 | Disposition: A | Discharge status: Home / Self Care | Payer: Medicare Other | Attending: Emergency Medicine | Admitting: Emergency Medicine

## 2014-04-22 ENCOUNTER — Encounter (HOSPITAL_COMMUNITY): Payer: Self-pay | Admitting: Emergency Medicine

## 2014-04-22 DIAGNOSIS — Z79899 Other long term (current) drug therapy: Secondary | ICD-10-CM | POA: Insufficient documentation

## 2014-04-22 DIAGNOSIS — Z7982 Long term (current) use of aspirin: Secondary | ICD-10-CM | POA: Insufficient documentation

## 2014-04-22 DIAGNOSIS — Z7952 Long term (current) use of systemic steroids: Secondary | ICD-10-CM | POA: Diagnosis not present

## 2014-04-22 DIAGNOSIS — Z794 Long term (current) use of insulin: Secondary | ICD-10-CM | POA: Diagnosis not present

## 2014-04-22 DIAGNOSIS — I10 Essential (primary) hypertension: Secondary | ICD-10-CM | POA: Insufficient documentation

## 2014-04-22 DIAGNOSIS — E119 Type 2 diabetes mellitus without complications: Secondary | ICD-10-CM | POA: Insufficient documentation

## 2014-04-22 DIAGNOSIS — Z87718 Personal history of other specified (corrected) congenital malformations of genitourinary system: Secondary | ICD-10-CM | POA: Insufficient documentation

## 2014-04-22 DIAGNOSIS — M25511 Pain in right shoulder: Secondary | ICD-10-CM | POA: Diagnosis present

## 2014-04-22 DIAGNOSIS — M7551 Bursitis of right shoulder: Secondary | ICD-10-CM | POA: Insufficient documentation

## 2014-04-22 MED ORDER — OXYCODONE-ACETAMINOPHEN 5-325 MG PO TABS: 1.0000 | ORAL_TABLET | ORAL | Status: DC | PRN

## 2014-04-22 MED ORDER — OXYCODONE-ACETAMINOPHEN 5-325 MG PO TABS
2.0000 | ORAL_TABLET | Freq: Once | ORAL | Status: AC
  Administered 2014-04-22: 2 via ORAL
  Filled 2014-04-22: qty 2

## 2014-04-22 NOTE — ED Notes (Addendum)
PT c/o right shoulder pain x6 days with no injury and decreased ROM. PT [facility] but medication caused nausea.

## 2014-04-22 NOTE — ED Provider Notes (Signed)
CSN: [id number]     Arrival date & time 04/22/14  1629 History  This chart was scribed for Pauline Aus, PA, working with Benny Lennert, MD by Elon Spanner, ED Scribe. This patient was seen in room APFT21/APFT21 and the patient's care was started at 6:40 PM.   Chief Complaint  Patient presents with  . Shoulder Pain   The history is provided by the patient. No language interpreter was used.   HPI Comments: [name] is a 49 y.o. male with a history of DM, HTN, DDD, and polycystic kidney disease who presents to the Emergency Department complaining of right shoulder pain onset >1 week ago without fall or injury.  He reports the pain is aggravated by certain motions, especially lifting his arm, and relieved by positioning, specifically placing his arm across his chest.  Patient has used icy hot and topical alcohol without relief.  Patient is prescribed hydrocodone for chronic back pain but reports it provides some minor transient relief for his shoulder.  Patient was seen on 04/20/14 at [facility] for the same complaint where he received negative imaging.  He does not recall a diagnosis.  Patient is scheduled to see his PCP next week.  He denies fever, numbness or weakness of the extremities, CP, shortness of breath or swelling of the joint  Past Medical History  Diagnosis Date  . Diabetes mellitus   . Hypertension   . Polycystic kidney disease   . DDD (degenerative disc disease), lumbar    Past Surgical History  Procedure Laterality Date  . Nephrectomy transplanted organ     No family history on file. History  Substance Use Topics  . Smoking status: Never Smoker   . Smokeless tobacco: Not on file  . Alcohol Use: Yes     Comment: rarely    Review of Systems  Constitutional: Negative for fever and chills.  Eyes: Negative for visual disturbance.  Respiratory: Negative for chest tightness and shortness of breath.   Cardiovascular: Negative for chest pain.  Gastrointestinal:  Negative for vomiting.  Genitourinary: Negative for dysuria and difficulty urinating.  Musculoskeletal: Positive for arthralgias (right shoulder). Negative for joint swelling, neck pain and neck stiffness.  Skin: Negative for color change and wound.  Neurological: Negative for dizziness, weakness, numbness and headaches.  All other systems reviewed and are negative.     Allergies  Review of patient's allergies indicates no known allergies.  Home Medications   Prior to Admission medications   Medication Sig Start Date End Date Taking? Authorizing Provider  aspirin EC 81 MG tablet Take 81 mg by mouth daily.    Historical Provider, MD  HYDROcodone-acetaminophen (NORCO/VICODIN) 5-325 MG per tablet Take 1 tablet by mouth every 4 (four) hours as needed for pain. 09/16/12   Burgess Amor, PA-C  insulin aspart (NOVOLOG FLEXPEN) 100 UNIT/ML injection Inject 15 Units into the skin 3 (three) times daily before meals. As directed/needed per sliding scale    Historical Provider, MD  insulin glargine (LANTUS SOLOSTAR) 100 UNIT/ML injection Inject 36 Units into the skin at bedtime.    Historical Provider, MD  mycophenolate (CELLCEPT) 250 MG capsule Take 1,000 mg by mouth 2 (two) times daily.    Historical Provider, MD  predniSONE (DELTASONE) 5 MG tablet Take 5 mg by mouth daily.    Historical Provider, MD  tacrolimus (PROGRAF) 1 MG capsule Take 2 mg by mouth 2 (two) times daily.    Historical Provider, MD  tetrahydrozoline-zinc (VISINE-AC) 0.05-0.25 % ophthalmic solution Place  2 drops into both eyes 3 (three) times daily as needed. Dry/Red Eyes    Historical Provider, MD   BP 118/77 mmHg  Pulse 101  Temp(Src) 98 F (36.7 C) (Oral)  Resp 20  Ht 5\' 4"  (1.626 m)  Wt 175 lb (79.379 kg)  BMI 30.02 kg/m2  SpO2 98% Physical Exam  Constitutional: He is oriented to person, place, and time. He appears well-developed and well-nourished. No distress.  HENT:  Head: Normocephalic and atraumatic.  Eyes:  Conjunctivae are normal.  Neck: Normal range of motion. Neck supple. No tracheal deviation present.  Cardiovascular: Normal rate and regular rhythm.   No murmur heard. Pulmonary/Chest: Effort normal. No respiratory distress.  Musculoskeletal: He exhibits no edema.  Tenderness to the posterior and anterior right shoulder.  Pain reproduced with abduction.  No edema or step-off deformity.  Radial pulse and distal sensation intact.  Neurological: He is alert and oriented to person, place, and time.  Skin: Skin is warm and dry.  Psychiatric: He has a normal mood and affect. His behavior is normal.  Nursing note and vitals reviewed.   ED Course  Procedures (including critical care time)  DIAGNOSTIC STUDIES: Oxygen Saturation is 98% on RA, normal by my interpretation.    COORDINATION OF CARE:  6:45 PM Discussed treatment plan with patient at bedside.  Patient acknowledges and agrees with plan.    Labs Review Labs Reviewed - No data to display  Imaging Review No results found.   EKG Interpretation None      MDM   Final diagnoses:  Bursitis, shoulder, right   Pt has copy of XR results from [facility] that were read as neg for acute bony injury.   Patient with right shoulder pain that is worse with abduction.  No headache, dizziness or neurological deficits on exam.  Pain is concerning for rotator cuff injury.  Pt agrees to arrange orthopedic f/u, referral given.  rx for percocet.  Pain appears stable for d/c  I personally performed the services described in this documentation, which was scribed in my presence. The recorded information has been reviewed and is accurate.    Severiano Gilbertammi Nashali Ditmer, PA-C 04/24/14 2000  Bethann BerkshireJoseph Zammit, MD 04/25/14 [phone]

## 2014-05-24 ENCOUNTER — Ambulatory Visit: Payer: Medicare Other | Admitting: Orthopedic Surgery

## 2014-05-28 ENCOUNTER — Ambulatory Visit (INDEPENDENT_AMBULATORY_CARE_PROVIDER_SITE_OTHER): Payer: Medicare Other | Admitting: Orthopedic Surgery

## 2014-05-28 VITALS — BP 128/98 | Ht 64.0 in | Wt 175.0 lb

## 2014-05-28 DIAGNOSIS — M75101 Unspecified rotator cuff tear or rupture of right shoulder, not specified as traumatic: Secondary | ICD-10-CM

## 2014-05-28 NOTE — Progress Notes (Signed)
Patient ID: [name], male   DOB: [birth year], 49 y.o.   MRN: [id number]  Chief Complaint  Patient presents with  . Shoulder Pain    right shoulder pain, no known injury     [name] is a 49 y.o. male.   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 Diabetes mellitus   . Hypertension   . Polycystic kidney disease   . DDD (degenerative disc disease), lumbar     Past Surgical History  Procedure Laterality Date  . Nephrectomy transplanted organ      No family history on file.  Social History History  Substance Use Topics  . Smoking status: Never Smoker   . Smokeless tobacco: Not on file  . Alcohol Use: Yes     Comment: rarely    No Known Allergies  Current Outpatient Prescriptions  Medication Sig Dispense Refill  . amLODipine (NORVASC) 10 MG tablet Take 10 mg by mouth daily.    Marland Kitchen. aspirin EC 81 MG tablet Take 81 mg by mouth daily.    . enalapril (VASOTEC) 5 MG tablet Take 5 mg by mouth daily.    . metFORMIN (GLUCOPHAGE) 500 MG tablet Take by mouth.    . mycophenolate (CELLCEPT) 250 MG capsule Take 1,000 mg by mouth 2 (two) times daily.    Marland Kitchen. omeprazole (PRILOSEC) 20 MG capsule Take 20 mg by mouth daily.    Marland Kitchen. oxyCODONE-acetaminophen (PERCOCET/ROXICET) 5-325 MG per tablet Take 1 tablet by mouth every 4 (four) hours as needed. 20 tablet 0  . predniSONE (DELTASONE) 5 MG tablet Take 5 mg by mouth daily.    . simvastatin (ZOCOR) 20 MG tablet Take 20 mg by mouth daily.    . tacrolimus (PROGRAF) 1 MG capsule Take 2 mg by mouth 2 (two) times daily.     Marland Kitchen. tetrahydrozoline-zinc (VISINE-AC) 0.05-0.25 % ophthalmic solution Place 2 drops into both eyes 3 (three) times daily as needed. Dry/Red Eyes     No current facility-administered medications for this visit.       Physical Exam Blood pressure 128/98, height 5\' 4"  (1.626 m), weight 175 lb (79.379 kg). Physical Exam The patient is well developed well nourished and well groomed. Orientation to person place and time is normal  Mood is pleasant. Ambulatory status normal ambulatory status he status post a kidney transplant but his shot on the left arm is noncontributory He does have painful elevation of his right shoulder no tenderness he has full passive range of motion though painful his shoulder is stable his motor exam is normal for rotator cuff tear scans intact sensations normal he has a good pulse has no epitrochlear lymph nodes in axilla is negative for lymphadenopathy as well  Data Reviewed [facility] he had a disc we sent it back I looked at it he has some degenerative changes in the before meals joint but otherwise normal shoulder  Assessment Date of cuff syndrome Plan Injection right shoulder. He can have an injection again in 2 weeks if no improvement. He is advised to rest  for a couple of weeks   Procedure note the subacromial injection shoulder RIGHT  Verbal consent was obtained to inject the  RIGHT   Shoulder  Timeout was completed to confirm the injection site is a subacromial space of the  RIGHT  shoulder   Medication used Depo-Medrol 40 mg and lidocaine 1% 3 cc  Anesthesia was provided by ethyl chloride  The injection was performed in the RIGHT  posterior subacromial space. After pinning the skin with alcohol and anesthetized the skin with ethyl chloride the subacromial space was injected using a 20-gauge needle. There were no complications  Sterile dressing was applied.

## 2014-05-28 NOTE — Patient Instructions (Signed)
Joint Injection  Care After  Refer to this sheet in the next few days. These instructions provide you with information on caring for yourself after you have had a joint injection. Your caregiver also may give you more specific instructions. Your treatment has been planned according to current medical practices, but problems sometimes occur. Call your caregiver if you have any problems or questions after your procedure.  After any type of joint injection, it is not uncommon to experience:  · Soreness, swelling, or bruising around the injection site.  · Mild numbness, tingling, or weakness around the injection site caused by the numbing medicine used before or with the injection.  It also is possible to experience the following effects associated with the specific agent after injection:  · Iodine-based contrast agents:  ¨ Allergic reaction (itching, hives, widespread redness, and swelling beyond the injection site).  · Corticosteroids (These effects are rare.):  ¨ Allergic reaction.  ¨ Increased blood sugar levels (If you have diabetes and you notice that your blood sugar levels have increased, notify your caregiver).  ¨ Increased blood pressure levels.  ¨ Mood swings.  · Hyaluronic acid in the use of viscosupplementation.  ¨ Temporary heat or redness.  ¨ Temporary rash and itching.  ¨ Increased fluid accumulation in the injected joint.  These effects all should resolve within a day after your procedure.   HOME CARE INSTRUCTIONS  · Limit yourself to light activity the day of your procedure. Avoid lifting heavy objects, bending, stooping, or twisting.  · Take prescription or over-the-counter pain medication as directed by your caregiver.  · You may apply ice to your injection site to reduce pain and swelling the day of your procedure. Ice may be applied 03-04 times:  ¨ Put ice in a plastic bag.  ¨ Place a towel between your skin and the bag.  ¨ Leave the ice on for no longer than 15-20 minutes each time.  SEEK  IMMEDIATE MEDICAL CARE IF:   · Pain and swelling get worse rather than better or extend beyond the injection site.  · Numbness does not go away.  · Blood or fluid continues to leak from the injection site.  · You have chest pain.  · You have swelling of your face or tongue.  · You have trouble breathing or you become dizzy.  · You develop a fever, chills, or severe tenderness at the injection site that last longer than 1 day.  MAKE SURE YOU:  · Understand these instructions.  · Watch your condition.  · Get help right away if you are not doing well or if you get worse.  Document Released: 10/16/2010 Document Revised: 04/27/2011 Document Reviewed: 10/16/2010  ExitCare® Patient Information ©2015 [employer]. This information is not intended to replace advice given to you by your health care provider. Make sure you discuss any questions you have with your health care provider.

## 2014-08-29 ENCOUNTER — Emergency Department (HOSPITAL_COMMUNITY): Payer: Medicare Other

## 2014-08-29 ENCOUNTER — Encounter (HOSPITAL_COMMUNITY): Payer: Self-pay | Admitting: Emergency Medicine

## 2014-08-29 ENCOUNTER — Emergency Department (HOSPITAL_COMMUNITY)
Admission: EM | Admit: 2014-08-29 | Discharge: 2014-08-29 | Disposition: A | Discharge status: Home / Self Care | Payer: Medicare Other | Attending: Emergency Medicine | Admitting: Emergency Medicine

## 2014-08-29 DIAGNOSIS — K5900 Constipation, unspecified: Secondary | ICD-10-CM | POA: Diagnosis not present

## 2014-08-29 DIAGNOSIS — Z87718 Personal history of other specified (corrected) congenital malformations of genitourinary system: Secondary | ICD-10-CM | POA: Insufficient documentation

## 2014-08-29 DIAGNOSIS — Z9889 Other specified postprocedural states: Secondary | ICD-10-CM | POA: Diagnosis not present

## 2014-08-29 DIAGNOSIS — E119 Type 2 diabetes mellitus without complications: Secondary | ICD-10-CM | POA: Diagnosis not present

## 2014-08-29 DIAGNOSIS — Z7982 Long term (current) use of aspirin: Secondary | ICD-10-CM | POA: Insufficient documentation

## 2014-08-29 DIAGNOSIS — Z8739 Personal history of other diseases of the musculoskeletal system and connective tissue: Secondary | ICD-10-CM | POA: Diagnosis not present

## 2014-08-29 DIAGNOSIS — I10 Essential (primary) hypertension: Secondary | ICD-10-CM | POA: Diagnosis not present

## 2014-08-29 DIAGNOSIS — Z79899 Other long term (current) drug therapy: Secondary | ICD-10-CM | POA: Diagnosis not present

## 2014-08-29 DIAGNOSIS — Z7952 Long term (current) use of systemic steroids: Secondary | ICD-10-CM | POA: Diagnosis not present

## 2014-08-29 MED ORDER — BISACODYL 5 MG PO TBEC
5.0000 mg | DELAYED_RELEASE_TABLET | Freq: Once | ORAL | Status: AC
  Administered 2014-08-29: 5 mg via ORAL
  Filled 2014-08-29: qty 1

## 2014-08-29 MED ORDER — MAGNESIUM HYDROXIDE 400 MG/5ML PO SUSP
30.0000 mL | Freq: Once | ORAL | Status: AC
  Administered 2014-08-29: 30 mL via ORAL
  Filled 2014-08-29: qty 30

## 2014-08-29 NOTE — ED Provider Notes (Signed)
CSN: [id number]     Arrival date & time 08/29/14  2000 History  This chart was scribed for Donnetta HutchingBrian Bryssa Tones, MD by Octavia HeirArianna Nassar, ED Scribe. This patient was seen in room APA08/APA08 and the patient's care was started at 9:07 PM.    Chief Complaint  Patient presents with  . Constipation      The history is provided by the patient. No language interpreter was used.   HPI Comments: [name] is a 49 y.o. male who has a hx of DM and HTN presents to the Emergency Department complaining of constipation onset 3 days ago. He notes his last bowel movement was about 3 days ago and reports he normally has frequent bowel movements. He states when he eats he feels as if "he is going to get choked". Pt has a surgical hx of kidney transplant in 2007. Pt denies vomiting, fever, chills..  Dr. Sudie BaileyKnowlton  Past Medical History  Diagnosis Date  . Diabetes mellitus   . Hypertension   . Polycystic kidney disease   . DDD (degenerative disc disease), lumbar    Past Surgical History  Procedure Laterality Date  . Nephrectomy transplanted organ     No family history on file. History  Substance Use Topics  . Smoking status: Never Smoker   . Smokeless tobacco: Not on file  . Alcohol Use: No     Comment: rarely    Review of Systems  A complete 10 system review of systems was obtained and all systems are negative except as noted in the HPI and PMH.    Allergies  Review of patient's allergies indicates no known allergies.  Home Medications   Prior to Admission medications   Medication Sig Start Date End Date Taking? Authorizing Provider  amLODipine (NORVASC) 10 MG tablet Take 10 mg by mouth daily.   Yes Historical Provider, MD  aspirin EC 81 MG tablet Take 81 mg by mouth daily.   Yes Historical Provider, MD  enalapril (VASOTEC) 5 MG tablet Take 5 mg by mouth daily.   Yes Historical Provider, MD  glimepiride (AMARYL) 2 MG tablet Take 2 mg by mouth daily with breakfast.   Yes Historical Provider, MD   metFORMIN (GLUCOPHAGE-XR) 500 MG 24 hr tablet Take 1,000 mg by mouth 2 (two) times daily. 08/06/14  Yes Historical Provider, MD  mycophenolate (CELLCEPT) 250 MG capsule Take 1,000 mg by mouth 2 (two) times daily.   Yes Historical Provider, MD  omeprazole (PRILOSEC) 20 MG capsule Take 20 mg by mouth daily.   Yes Historical Provider, MD  oxyCODONE-acetaminophen (PERCOCET) 10-325 MG per tablet Take 1 tablet by mouth every 4 (four) hours as needed for pain.  08/15/14  Yes Historical Provider, MD  predniSONE (DELTASONE) 5 MG tablet Take 5 mg by mouth daily.   Yes Historical Provider, MD  simvastatin (ZOCOR) 20 MG tablet Take 20 mg by mouth daily.   Yes Historical Provider, MD  tacrolimus (PROGRAF) 1 MG capsule Take 2 mg by mouth 2 (two) times daily.   Yes Historical Provider, MD  oxyCODONE-acetaminophen (PERCOCET/ROXICET) 5-325 MG per tablet Take 1 tablet by mouth every 4 (four) hours as needed. Patient not taking: Reported on 08/29/2014 04/22/14   Tammy Triplett, PA-C  tetrahydrozoline-zinc (VISINE-AC) 0.05-0.25 % ophthalmic solution Place 2 drops into both eyes 3 (three) times daily as needed. Dry/Red Eyes    Historical Provider, MD   Triage vitals: BP 125/87 mmHg  Pulse 93  Temp(Src) 98.3 F (36.8 C) (Oral)  Resp 20  Ht  (1.626 m)  Wt 156 lb (70.761 kg)  BMI 26.76 kg/m2  SpO2 100% Physical Exam  Constitutional: He is oriented to person, place, and time. He appears well-developed and well-nourished.  HENT:  Head: Normocephalic and atraumatic.  Eyes: Conjunctivae and EOM are normal. Pupils are equal, round, and reactive to light.  Neck: Normal range of motion. Neck supple.  Cardiovascular: Normal rate and regular rhythm.   Pulmonary/Chest: Effort normal and breath sounds normal.  Abdominal: Soft. Bowel sounds are normal.  Musculoskeletal: Normal range of motion.  Neurological: He is alert and oriented to person, place, and time.  Skin: Skin is warm and dry.  Psychiatric: He has a normal  mood and affect. His behavior is normal.  Nursing note and vitals reviewed.   ED Course  Procedures  DIAGNOSTIC STUDIES: Oxygen Saturation is 100% on RA, normal by my interpretation.  COORDINATION OF CARE: 9:09 PM Discussed treatment plan which includes acute abdominal series with pt at bedside and pt agreed to plan.  Labs Review Labs Reviewed - No data to display  Imaging Review Dg Abd Acute W/chest  08/29/2014   CLINICAL DATA:  Constipation for 3 days.  Abdominal pain.  EXAM: DG ABDOMEN ACUTE W/ 1V CHEST  COMPARISON:  None.  FINDINGS: There is no evidence of dilated bowel loops or free intraperitoneal air. No radiopaque calculi identified. Right lower quadrant surgical clips seen. Large stool burden noted.  Heart size and mediastinal contours are within normal limits. Both lungs are clear.  IMPRESSION: No acute findings.  Large stool burden noted, consistent with history of constipation.   Electronically Signed   By: Myles Rosenthal M.D.   On: 08/29/2014 22:02     EKG Interpretation None      MDM   Final diagnoses:  Constipation, unspecified constipation type   Patient is in no acute distress. Acute abdominal series shows no free air. Large stool burden noted.  No acute abdomen. I recommended several products including milk of magnesia, Dulcolax, fleets enema, Miralax, magnesium citrate   I personally performed the services described in this documentation, which was scribed in my presence. The recorded information has been reviewed and is accurate.   Donnetta Hutching, MD 08/29/14 [phone]

## 2014-08-29 NOTE — ED Notes (Signed)
Patient verbalizes understanding of discharge instructions, home care and follow up care. Patient ambulatory out of department at this time. 

## 2014-08-29 NOTE — Discharge Instructions (Signed)
Constipation Constipation is when a person:  Poops (has a bowel movement) less than 3 times a week.  Has a hard time pooping.  Has poop that is dry, hard, or bigger than normal. HOME CARE   Eat foods with a lot of fiber in them. This includes fruits, vegetables, beans, and whole grains such as brown rice.  Avoid fatty foods and foods with a lot of sugar. This includes french fries, hamburgers, cookies, candy, and soda.  If you are not getting enough fiber from food, take products with added fiber in them (supplements).  Drink enough fluid to keep your pee (urine) clear or pale yellow.  Exercise on a regular basis, or as told by your doctor.  Go to the restroom when you feel like you need to poop. Do not hold it.  Only take medicine as told by your doctor. Do not take medicines that help you poop (laxatives) without talking to your doctor first. GET HELP RIGHT AWAY IF:   You have bright red blood in your poop (stool).  Your constipation lasts more than 4 days or gets worse.  You have belly (abdominal) or butt (rectal) pain.  You have thin poop (as thin as a pencil).  You lose weight, and it cannot be explained. MAKE SURE YOU:   Understand these instructions.  Will watch your condition.  Will get help right away if you are not doing well or get worse. Document Released: 07/22/2007 Document Revised: 02/07/2013 Document Reviewed: 11/14/2012 [facility] Patient Information 2015 [place] [place]. This information is not intended to replace advice given to you by your health care provider. Make sure you discuss any questions you have with your health care provider.   Increase fluid, fruit, fiber. Drink prune juice. Recommend the following products: Dulcolax, magnesium citrate, Miralax, fleets enema, glycerin suppositories

## 2014-08-29 NOTE — ED Notes (Signed)
Pt c/o constipation x 3 days. Pt c/o abd pain.

## 2014-08-30 ENCOUNTER — Emergency Department (HOSPITAL_COMMUNITY)
Admission: EM | Admit: 2014-08-30 | Discharge: 2014-08-30 | Disposition: A | Discharge status: Home / Self Care | Payer: Medicare Other | Attending: Emergency Medicine | Admitting: Emergency Medicine

## 2014-08-30 ENCOUNTER — Encounter (HOSPITAL_COMMUNITY): Payer: Self-pay | Admitting: *Deleted

## 2014-08-30 DIAGNOSIS — Z7982 Long term (current) use of aspirin: Secondary | ICD-10-CM | POA: Insufficient documentation

## 2014-08-30 DIAGNOSIS — I10 Essential (primary) hypertension: Secondary | ICD-10-CM | POA: Insufficient documentation

## 2014-08-30 DIAGNOSIS — Z8739 Personal history of other diseases of the musculoskeletal system and connective tissue: Secondary | ICD-10-CM | POA: Diagnosis not present

## 2014-08-30 DIAGNOSIS — Z7952 Long term (current) use of systemic steroids: Secondary | ICD-10-CM | POA: Diagnosis not present

## 2014-08-30 DIAGNOSIS — Z79899 Other long term (current) drug therapy: Secondary | ICD-10-CM | POA: Insufficient documentation

## 2014-08-30 DIAGNOSIS — K5900 Constipation, unspecified: Secondary | ICD-10-CM | POA: Insufficient documentation

## 2014-08-30 DIAGNOSIS — Z87718 Personal history of other specified (corrected) congenital malformations of genitourinary system: Secondary | ICD-10-CM | POA: Insufficient documentation

## 2014-08-30 DIAGNOSIS — E119 Type 2 diabetes mellitus without complications: Secondary | ICD-10-CM | POA: Diagnosis not present

## 2014-08-30 NOTE — Discharge Instructions (Signed)
Follow up with your md as needed °

## 2014-08-30 NOTE — ED Notes (Signed)
Patient given soap suds enema. Patient received approximately 500 ml enema and large amount of stool noted in bedside commode. Patient states "I feel much better." Patient received additional 250 ml soap suds enema. Patient lying in bed on left side at this time.

## 2014-08-30 NOTE — ED Notes (Signed)
Pt updated on wait, pt sitting in waiting area, NAD noted.

## 2014-08-30 NOTE — ED Notes (Signed)
Last BM 4 days ago. Was seen in ER last night for same and states "I don't feel like they did what they needed to. I'm having pressure in my bottom."

## 2014-08-30 NOTE — ED Provider Notes (Signed)
CSN: [id number]     Arrival date & time 08/30/14  1256 History   First MD Initiated Contact with Patient 08/30/14 1606     Chief Complaint  Patient presents with  . Constipation     (Consider location/radiation/quality/duration/timing/severity/associated sxs/prior Treatment) Patient is a 49 y.o. male presenting with constipation. The history is provided by the patient (pt with constipation.  pt seen yesterday here and has not improved with tx).  Constipation Severity:  Moderate Timing:  Constant Progression:  Worsening Chronicity:  New Context: dehydration   Stool description:  None produced Associated symptoms: no abdominal pain, no back pain and no diarrhea     Past Medical History  Diagnosis Date  . Diabetes mellitus   . Hypertension   . Polycystic kidney disease   . DDD (degenerative disc disease), lumbar    Past Surgical History  Procedure Laterality Date  . Nephrectomy transplanted organ     History reviewed. No pertinent family history. History  Substance Use Topics  . Smoking status: Never Smoker   . Smokeless tobacco: Not on file  . Alcohol Use: No     Comment: rarely    Review of Systems  Constitutional: Negative for appetite change and fatigue.  HENT: Negative for congestion, ear discharge and sinus pressure.   Eyes: Negative for discharge.  Respiratory: Negative for cough.   Cardiovascular: Negative for chest pain.  Gastrointestinal: Positive for constipation. Negative for abdominal pain and diarrhea.  Genitourinary: Negative for frequency and hematuria.  Musculoskeletal: Negative for back pain.  Skin: Negative for rash.  Neurological: Negative for seizures and headaches.  Psychiatric/Behavioral: Negative for hallucinations.      Allergies  Review of patient's allergies indicates no known allergies.  Home Medications   Prior to Admission medications   Medication Sig Start Date End Date Taking? Authorizing Provider  amLODipine (NORVASC) 10 MG  tablet Take 10 mg by mouth daily.   Yes Historical Provider, MD  aspirin EC 81 MG tablet Take 81 mg by mouth daily.   Yes Historical Provider, MD  enalapril (VASOTEC) 5 MG tablet Take 5 mg by mouth daily.   Yes Historical Provider, MD  glimepiride (AMARYL) 2 MG tablet Take 2 mg by mouth daily with breakfast.   Yes Historical Provider, MD  metFORMIN (GLUCOPHAGE-XR) 500 MG 24 hr tablet Take 1,000 mg by mouth 2 (two) times daily. 08/06/14  Yes Historical Provider, MD  mycophenolate (CELLCEPT) 250 MG capsule Take 1,000 mg by mouth 2 (two) times daily.   Yes Historical Provider, MD  omeprazole (PRILOSEC) 20 MG capsule Take 20 mg by mouth daily.   Yes Historical Provider, MD  oxyCODONE-acetaminophen (PERCOCET) 10-325 MG per tablet Take 1 tablet by mouth every 4 (four) hours as needed for pain.  08/15/14  Yes Historical Provider, MD  predniSONE (DELTASONE) 5 MG tablet Take 5 mg by mouth daily.   Yes Historical Provider, MD  simvastatin (ZOCOR) 20 MG tablet Take 20 mg by mouth daily.   Yes Historical Provider, MD  tacrolimus (PROGRAF) 1 MG capsule Take 2 mg by mouth 2 (two) times daily.   Yes Historical Provider, MD  tetrahydrozoline-zinc (VISINE-AC) 0.05-0.25 % ophthalmic solution Place 2 drops into both eyes 3 (three) times daily as needed. Dry/Red Eyes   Yes Historical Provider, MD   BP 135/90 mmHg  Pulse 77  Temp(Src) 97.6 F (36.4 C) (Oral)  Resp 18  SpO2 100% Physical Exam  Constitutional: He is oriented to person, place, and time. He appears well-developed.  HENT:  Head: Normocephalic.  Eyes: Conjunctivae and EOM are normal. No scleral icterus.  Neck: Neck supple. No thyromegaly present.  Cardiovascular: Normal rate and regular rhythm.  Exam reveals no gallop and no friction rub.   No murmur heard. Pulmonary/Chest: No stridor. He has no wheezes. He has no rales. He exhibits no tenderness.  Abdominal: He exhibits distension. There is no tenderness. There is no rebound.  Musculoskeletal:  Normal range of motion. He exhibits no edema.  Lymphadenopathy:    He has no cervical adenopathy.  Neurological: He is oriented to person, place, and time. He exhibits normal muscle tone. Coordination normal.  Skin: No rash noted. No erythema.  Psychiatric: He has a normal mood and affect. His behavior is normal.    ED Course  Procedures (including critical care time) Labs Review Labs Reviewed - No data to display  Imaging Review Dg Abd Acute W/chest  08/29/2014   CLINICAL DATA:  Constipation for 3 days.  Abdominal pain.  EXAM: DG ABDOMEN ACUTE W/ 1V CHEST  COMPARISON:  None.  FINDINGS: There is no evidence of dilated bowel loops or free intraperitoneal air. No radiopaque calculi identified. Right lower quadrant surgical clips seen. Large stool burden noted.  Heart size and mediastinal contours are within normal limits. Both lungs are clear.  IMPRESSION: No acute findings.  Large stool burden noted, consistent with history of constipation.   Electronically Signed   By: Myles RosenthalJohn  Stahl M.D.   On: 08/29/2014 22:02     EKG Interpretation None      MDM   Final diagnoses:  Constipation, unspecified constipation type    Constipation relieved with soap sud enema.  Pt to follow up with pcp    Bethann BerkshireJoseph Bailen Geffre, MD 08/30/14 [phone]

## 2014-08-30 NOTE — ED Notes (Signed)
Patient states he is only passing water from enema at this time.

## 2014-11-14 ENCOUNTER — Emergency Department (HOSPITAL_COMMUNITY): Payer: Medicare Other

## 2014-11-14 ENCOUNTER — Observation Stay (HOSPITAL_COMMUNITY)
Admission: EM | Admit: 2014-11-14 | Discharge: 2014-11-16 | Disposition: A | Discharge status: Home / Self Care | Payer: Medicare Other | Attending: Family Medicine | Admitting: Family Medicine

## 2014-11-14 ENCOUNTER — Encounter (HOSPITAL_COMMUNITY): Payer: Self-pay | Admitting: Emergency Medicine

## 2014-11-14 DIAGNOSIS — Z940 Kidney transplant status: Secondary | ICD-10-CM | POA: Diagnosis not present

## 2014-11-14 DIAGNOSIS — E119 Type 2 diabetes mellitus without complications: Secondary | ICD-10-CM | POA: Insufficient documentation

## 2014-11-14 DIAGNOSIS — E1169 Type 2 diabetes mellitus with other specified complication: Secondary | ICD-10-CM

## 2014-11-14 DIAGNOSIS — R6510 Systemic inflammatory response syndrome (SIRS) of non-infectious origin without acute organ dysfunction: Secondary | ICD-10-CM | POA: Diagnosis present

## 2014-11-14 DIAGNOSIS — M5136 Other intervertebral disc degeneration, lumbar region: Secondary | ICD-10-CM | POA: Diagnosis not present

## 2014-11-14 DIAGNOSIS — E118 Type 2 diabetes mellitus with unspecified complications: Secondary | ICD-10-CM

## 2014-11-14 DIAGNOSIS — I10 Essential (primary) hypertension: Secondary | ICD-10-CM | POA: Diagnosis not present

## 2014-11-14 DIAGNOSIS — Z7952 Long term (current) use of systemic steroids: Secondary | ICD-10-CM | POA: Diagnosis not present

## 2014-11-14 DIAGNOSIS — Z7982 Long term (current) use of aspirin: Secondary | ICD-10-CM | POA: Diagnosis not present

## 2014-11-14 DIAGNOSIS — Q613 Polycystic kidney, unspecified: Secondary | ICD-10-CM | POA: Diagnosis not present

## 2014-11-14 DIAGNOSIS — A419 Sepsis, unspecified organism: Secondary | ICD-10-CM | POA: Diagnosis not present

## 2014-11-14 DIAGNOSIS — Z79899 Other long term (current) drug therapy: Secondary | ICD-10-CM | POA: Insufficient documentation

## 2014-11-14 DIAGNOSIS — R509 Fever, unspecified: Secondary | ICD-10-CM | POA: Diagnosis present

## 2014-11-14 LAB — COMPREHENSIVE METABOLIC PANEL
ALT: 13 U/L — AB (ref 17–63)
AST: 18 U/L (ref 15–41)
Albumin: 3.9 g/dL (ref 3.5–5.0)
Alkaline Phosphatase: 65 U/L (ref 38–126)
Anion gap: 7 (ref 5–15)
BUN: 22 mg/dL — AB (ref 6–20)
CALCIUM: 9 mg/dL (ref 8.9–10.3)
CO2: 22 mmol/L (ref 22–32)
CREATININE: 1.19 mg/dL (ref 0.61–1.24)
Chloride: 105 mmol/L (ref 101–111)
GFR calc Af Amer: >60 mL/min (ref >60)
GFR calc non Af Amer: >60 mL/min (ref >60)
Glucose, Bld: 294 mg/dL — ABNORMAL HIGH (ref 65–99)
Potassium: 4 mmol/L (ref 3.5–5.1)
Sodium: 134 mmol/L — ABNORMAL LOW (ref 135–145)
Total Bilirubin: 0.5 mg/dL (ref 0.3–1.2)
Total Protein: 7.1 g/dL (ref 6.5–8.1)

## 2014-11-14 LAB — CBC WITH DIFFERENTIAL/PLATELET
BASOS ABS: 0 10*3/uL (ref 0.0–0.1)
Basophils Relative: 0 %
Eosinophils Absolute: 0.1 10*3/uL (ref 0.0–0.7)
Eosinophils Relative: 1 %
HEMATOCRIT: 41.7 % (ref 39.0–52.0)
Hemoglobin: 13.8 g/dL (ref 13.0–17.0)
LYMPHS PCT: 33 %
Lymphs Abs: 2.2 10*3/uL (ref 0.7–4.0)
MCH: 27.4 pg (ref 26.0–34.0)
MCHC: 33.1 g/dL (ref 30.0–36.0)
MCV: 82.7 fL (ref 78.0–100.0)
MONO ABS: 0.4 10*3/uL (ref 0.1–1.0)
Monocytes Relative: 6 %
NEUTROS ABS: 3.9 10*3/uL (ref 1.7–7.7)
Neutrophils Relative %: 60 %
Platelets: 155 10*3/uL (ref 150–400)
RBC: 5.04 MIL/uL (ref 4.22–5.81)
RDW: 13.1 % (ref 11.5–15.5)
WBC: 6.6 10*3/uL (ref 4.0–10.5)

## 2014-11-14 LAB — URINALYSIS, ROUTINE W REFLEX MICROSCOPIC
Bilirubin Urine: NEGATIVE
Ketones, ur: NEGATIVE mg/dL
Leukocytes, UA: NEGATIVE
Nitrite: NEGATIVE
Protein, ur: NEGATIVE mg/dL
SPECIFIC GRAVITY, URINE: 1.02 (ref 1.005–1.030)
Urobilinogen, UA: 0.2 mg/dL (ref 0.0–1.0)
pH: 6 (ref 5.0–8.0)

## 2014-11-14 LAB — URINE MICROSCOPIC-ADD ON

## 2014-11-14 LAB — I-STAT CG4 LACTIC ACID, ED: LACTIC ACID, VENOUS: 0.93 mmol/L (ref 0.5–2.0)

## 2014-11-14 LAB — CK: Total CK: 172 U/L (ref 49–397)

## 2014-11-14 MED ORDER — SODIUM CHLORIDE 0.9 % IV BOLUS (SEPSIS)
1000.0000 mL | INTRAVENOUS | Status: AC
  Administered 2014-11-14: 1000 mL via INTRAVENOUS

## 2014-11-14 MED ORDER — SODIUM CHLORIDE 0.9 % IV SOLN: 1000.0000 mL | INTRAVENOUS | Status: DC

## 2014-11-14 MED ORDER — PIPERACILLIN-TAZOBACTAM 3.375 G IVPB 30 MIN
3.3750 g | Freq: Once | INTRAVENOUS | Status: AC
  Administered 2014-11-14: 3.375 g via INTRAVENOUS
  Filled 2014-11-14: qty 50

## 2014-11-14 MED ORDER — SODIUM CHLORIDE 0.9 % IV SOLN: INTRAVENOUS | Status: DC

## 2014-11-14 MED ORDER — ACETAMINOPHEN 325 MG PO TABS
650.0000 mg | ORAL_TABLET | Freq: Once | ORAL | Status: AC | PRN
  Administered 2014-11-14: 650 mg via ORAL
  Filled 2014-11-14: qty 2

## 2014-11-14 MED ORDER — VANCOMYCIN HCL IN DEXTROSE 1-5 GM/200ML-% IV SOLN
1000.0000 mg | Freq: Once | INTRAVENOUS | Status: AC
  Administered 2014-11-14: 1000 mg via INTRAVENOUS
  Filled 2014-11-14: qty 200

## 2014-11-14 MED ORDER — SODIUM CHLORIDE 0.9 % IV BOLUS (SEPSIS)
500.0000 mL | INTRAVENOUS | Status: AC
  Administered 2014-11-14: 500 mL via INTRAVENOUS

## 2014-11-14 NOTE — ED Provider Notes (Signed)
CSN: [id number]     Arrival date & time 11/14/14  2038 History  By signing my name below, I, Budd Palmer, attest that this documentation has been prepared under the direction and in the presence of Glynn Octave, MD. Electronically Signed: Budd Palmer, ED Scribe. 11/14/2014. 9:31 PM.    Chief Complaint  Patient presents with  . Fever   The history is provided by the patient. No language interpreter was used.   HPI Comments: [name] is a 49 y.o. male with a PMHx of DM, HTN, and polycystic kidney disease, as well as a PSHx of nephrectomy transplanted organ (2007, at [facility] by Dr Hale Bogus) who presents to the Emergency Department complaining of fever (Tmax 101.1) onset today. He reports associated body aches (onset 1 day ago), as well as HA. He states he is on prednisone (5 mg). Pt notes he has taken a muscle relaxant today. He denies recent travel or sick contacts. Pt denies cough, CP, sore throat, abdominal pain, and n/v.  Past Medical History  Diagnosis Date  . Diabetes mellitus   . Hypertension   . Polycystic kidney disease   . DDD (degenerative disc disease), lumbar    Past Surgical History  Procedure Laterality Date  . Nephrectomy transplanted organ     History reviewed. No pertinent family history. Social History  Substance Use Topics  . Smoking status: Never Smoker   . Smokeless tobacco: None  . Alcohol Use: No     Comment: rarely    Review of Systems A complete 10 system review of systems was obtained and all systems are negative except as noted in the HPI and PMH.    Allergies  Review of patient's allergies indicates no known allergies.  Home Medications   Prior to Admission medications   Medication Sig Start Date End Date Taking? Authorizing Provider  amLODipine (NORVASC) 10 MG tablet Take 10 mg by mouth daily.   Yes Historical Provider, MD  aspirin EC 81 MG tablet Take 81 mg by mouth daily.   Yes Historical Provider, MD  enalapril (VASOTEC) 5 MG  tablet Take 5 mg by mouth daily.   Yes Historical Provider, MD  glimepiride (AMARYL) 2 MG tablet Take 2 mg by mouth daily with breakfast.   Yes Historical Provider, MD  metFORMIN (GLUCOPHAGE-XR) 500 MG 24 hr tablet Take 1,000 mg by mouth 2 (two) times daily. 08/06/14  Yes Historical Provider, MD  mycophenolate (CELLCEPT) 250 MG capsule Take 1,000 mg by mouth 2 (two) times daily.   Yes Historical Provider, MD  omeprazole (PRILOSEC) 20 MG capsule Take 20 mg by mouth daily as needed (for acid reflux).    Yes Historical Provider, MD  oxyCODONE-acetaminophen (PERCOCET) 10-325 MG per tablet Take 1 tablet by mouth every 4 (four) hours as needed for pain.  08/15/14  Yes Historical Provider, MD  predniSONE (DELTASONE) 5 MG tablet Take 5 mg by mouth daily.   Yes Historical Provider, MD  simvastatin (ZOCOR) 20 MG tablet Take 20 mg by mouth daily.   Yes Historical Provider, MD  tacrolimus (PROGRAF) 1 MG capsule Take 2 mg by mouth 2 (two) times daily.   Yes Historical Provider, MD  tetrahydrozoline-zinc (VISINE-AC) 0.05-0.25 % ophthalmic solution Place 2 drops into both eyes 3 (three) times daily as needed. Dry/Red Eyes   Yes Historical Provider, MD   BP 98/61 mmHg  Pulse 83  Temp(Src) 99 F (37.2 C) (Oral)  Resp 17  Ht  (1.626 m)  Wt 154 lb 15.8 oz (  70.302 kg)  BMI 26.59 kg/m2  SpO2 97% Physical Exam  Constitutional: He is oriented to person, place, and time. He appears well-developed and well-nourished. No distress.  Appears ill, feels warm  HENT:  Head: Normocephalic and atraumatic.  Mouth/Throat: Oropharynx is clear and moist. No oropharyngeal exudate.  Eyes: Conjunctivae and EOM are normal. Pupils are equal, round, and reactive to light.  Neck: Normal range of motion. Neck supple.  No meningismus.  Cardiovascular: Regular rhythm and intact distal pulses.   No murmur heard. Tachycardic  Pulmonary/Chest: Effort normal and breath sounds normal. No respiratory distress.  Abdominal: Soft.  There is no tenderness. There is no rebound and no guarding.  No pain over transplant kidney in RLQ  Musculoskeletal: Normal range of motion. He exhibits no edema or tenderness.  Neurological: He is alert and oriented to person, place, and time. No cranial nerve deficit. He exhibits normal muscle tone. Coordination normal.  No ataxia on finger to nose bilaterally. No pronator drift. 5/5 strength throughout. CN 2-12 intact. Negative Romberg. Equal grip strength. Sensation intact. Gait is normal.   Skin: Skin is warm. No rash noted.  Psychiatric: He has a normal mood and affect. His behavior is normal.  Nursing note and vitals reviewed.   ED Course  Procedures  DIAGNOSTIC STUDIES: Oxygen Saturation is 99% on RA, normal by my interpretation.    COORDINATION OF CARE: 9:27 PM - Discussed plans to order diagnostic studies and imaging. Will order antibiotics. Pt advised of plan for treatment and pt agrees.  Labs Review Labs Reviewed  URINALYSIS, ROUTINE W REFLEX MICROSCOPIC (NOT AT [facility]) - Abnormal; Notable for the following:    Glucose, UA >1000 (*)    Hgb urine dipstick TRACE (*)    All other components within normal limits  COMPREHENSIVE METABOLIC PANEL - Abnormal; Notable for the following:    Sodium 134 (*)    Glucose, Bld 294 (*)    BUN 22 (*)    ALT 13 (*)    All other components within normal limits  GLUCOSE, CAPILLARY - Abnormal; Notable for the following:    Glucose-Capillary 306 (*)    All other components within normal limits  CULTURE, BLOOD (ROUTINE X 2)  CULTURE, BLOOD (ROUTINE X 2)  URINE CULTURE  CBC WITH DIFFERENTIAL/PLATELET  CK  URINE MICROSCOPIC-ADD ON  INFLUENZA PANEL BY PCR (TYPE A & B, H1N1)(NOT AT [facility])  BASIC METABOLIC PANEL  CBC  HEMOGLOBIN A1C  I-STAT CG4 LACTIC ACID, ED  I-STAT CG4 LACTIC ACID, ED    Imaging Review Dg Chest 2 View  11/14/2014   CLINICAL DATA:  Fever and body aches  EXAM: CHEST  2 VIEW  COMPARISON:  08/29/2014  FINDINGS: Mild  hypoventilation with atelectasis. There is no edema, consolidation, effusion, or pneumothorax. Normal heart size and mediastinal contours.  IMPRESSION: Subsegmental atelectasis.  No indication of pneumonia.   Electronically Signed   By: Marnee Spring M.D.   On: 11/14/2014 23:24   I have personally reviewed and evaluated these images and lab results as part of my medical decision-making.   EKG Interpretation None      MDM   Final diagnoses:  Sepsis, due to unspecified organism  History of renal transplant   bodyaches and fever. History of kidney transplant 2007. Febrile tachycardic on arrival. Code sepsis activated  Labs, cultures, IVF, antibiotics. CXR and UA negative. Suspect viral infection, however patient at increased risk due to immunocompromised state. Flu swab negative.  Observation admission dw Dr. Onalee Hua.  BP remains stable in the ED.  I personally performed the services described in this documentation, which was scribed in my presence. The recorded information has been reviewed and is accurate.   Glynn Octave, MD 11/15/14 [phone]

## 2014-11-14 NOTE — Progress Notes (Signed)
ANTIBIOTIC CONSULT NOTE-Preliminary  Pharmacy Consult for Vancomycin and Zosyn Indication: sepsis  No Known Allergies  Patient Measurements: Height:  (162.6 cm) Weight: 155 lb (70.308 kg) IBW/kg (Calculated) : 59.2  Vital Signs: Temp: 101.1 F (38.4 C) (09/28 2058) Temp Source: Oral (09/28 1610) BP: 118/87 mmHg (09/28 2058) Pulse Rate: 110 (09/28 2058)  Labs:  Recent Labs  11/14/14 2140  WBC 6.6  HGB 13.8  PLT 155    CrCl cannot be calculated (Patient has no serum creatinine result on file.).  No results for input(s): VANCOTROUGH, VANCOPEAK, VANCORANDOM, GENTTROUGH, GENTPEAK, GENTRANDOM, TOBRATROUGH, TOBRAPEAK, TOBRARND, AMIKACINPEAK, AMIKACINTROU, AMIKACIN in the last 72 hours.   Microbiology: No results found for this or any previous visit (from the past 720 hour(s)).  Medical History: Past Medical History  Diagnosis Date  . Diabetes mellitus   . Hypertension   . Polycystic kidney disease   . DDD (degenerative disc disease), lumbar    Assessment: Vancomycin and Zosyn for suspected sepsis. Labs pending  Goal of Therapy:  Eradicate infection.  Plan:  Preliminary review of pertinent patient information completed.  Protocol will be initiated with a one-time dose(s) of Zosyn 3.375gm and Vancomycin 1gm.  Jeani Hawking clinical pharmacist will complete review during morning rounds to assess patient and finalize treatment regimen.  Valrie Hart A, RPH 11/14/2014,9:55 PM

## 2014-11-14 NOTE — ED Notes (Signed)
Started yesterday with generalized body aches and just feeling unwell- No cough, congestion or symptoms of URI- skin hot to touch - Took muscle relaxer prior to coming to ER

## 2014-11-15 DIAGNOSIS — E119 Type 2 diabetes mellitus without complications: Secondary | ICD-10-CM

## 2014-11-15 DIAGNOSIS — R6510 Systemic inflammatory response syndrome (SIRS) of non-infectious origin without acute organ dysfunction: Secondary | ICD-10-CM | POA: Diagnosis present

## 2014-11-15 DIAGNOSIS — R509 Fever, unspecified: Secondary | ICD-10-CM

## 2014-11-15 DIAGNOSIS — E1169 Type 2 diabetes mellitus with other specified complication: Secondary | ICD-10-CM

## 2014-11-15 DIAGNOSIS — Z940 Kidney transplant status: Secondary | ICD-10-CM | POA: Diagnosis not present

## 2014-11-15 DIAGNOSIS — Q613 Polycystic kidney, unspecified: Secondary | ICD-10-CM | POA: Diagnosis not present

## 2014-11-15 DIAGNOSIS — I10 Essential (primary) hypertension: Secondary | ICD-10-CM | POA: Diagnosis present

## 2014-11-15 DIAGNOSIS — A419 Sepsis, unspecified organism: Secondary | ICD-10-CM | POA: Diagnosis not present

## 2014-11-15 LAB — GLUCOSE, CAPILLARY
GLUCOSE-CAPILLARY: 191 mg/dL — AB (ref 65–99)
Glucose-Capillary: 219 mg/dL — ABNORMAL HIGH (ref 65–99)
Glucose-Capillary: 306 mg/dL — ABNORMAL HIGH (ref 65–99)
Glucose-Capillary: 308 mg/dL — ABNORMAL HIGH (ref 65–99)

## 2014-11-15 LAB — BASIC METABOLIC PANEL
Anion gap: 10 (ref 5–15)
BUN: 17 mg/dL (ref 6–20)
CO2: 23 mmol/L (ref 22–32)
Calcium: 9.2 mg/dL (ref 8.9–10.3)
Chloride: 106 mmol/L (ref 101–111)
Creatinine, Ser: 1.09 mg/dL (ref 0.61–1.24)
GFR calc Af Amer: >60 mL/min (ref >60)
GFR calc non Af Amer: >60 mL/min (ref >60)
Glucose, Bld: 138 mg/dL — ABNORMAL HIGH (ref 65–99)
Potassium: 3.8 mmol/L (ref 3.5–5.1)
Sodium: 139 mmol/L (ref 135–145)

## 2014-11-15 LAB — CBC
HCT: 41.3 % (ref 39.0–52.0)
Hemoglobin: 13.4 g/dL (ref 13.0–17.0)
MCH: 27.2 pg (ref 26.0–34.0)
MCHC: 32.4 g/dL (ref 30.0–36.0)
MCV: 83.8 fL (ref 78.0–100.0)
Platelets: 151 10*3/uL (ref 150–400)
RBC: 4.93 MIL/uL (ref 4.22–5.81)
RDW: 13.1 % (ref 11.5–15.5)
WBC: 6.4 10*3/uL (ref 4.0–10.5)

## 2014-11-15 LAB — INFLUENZA PANEL BY PCR (TYPE A & B)
H1N1FLUPCR: NOT DETECTED
INFLBPCR: NEGATIVE
Influenza A By PCR: NEGATIVE

## 2014-11-15 MED ORDER — SODIUM CHLORIDE 0.9 % IV SOLN: 250.0000 mL | INTRAVENOUS | Status: DC | PRN

## 2014-11-15 MED ORDER — MYCOPHENOLATE MOFETIL 250 MG PO CAPS
1000.0000 mg | ORAL_CAPSULE | Freq: Two times a day (BID) | ORAL | Status: DC
  Administered 2014-11-15 – 2014-11-16 (×4): 1000 mg via ORAL
  Filled 2014-11-15 (×10): qty 4

## 2014-11-15 MED ORDER — SODIUM CHLORIDE 0.9 % IJ SOLN
3.0000 mL | Freq: Two times a day (BID) | INTRAMUSCULAR | Status: DC
  Administered 2014-11-15 – 2014-11-16 (×4): 3 mL via INTRAVENOUS

## 2014-11-15 MED ORDER — POLYETHYLENE GLYCOL 3350 17 G PO PACK
17.0000 g | PACK | Freq: Two times a day (BID) | ORAL | Status: DC
  Administered 2014-11-15 – 2014-11-16 (×2): 17 g via ORAL
  Filled 2014-11-15 (×3): qty 1

## 2014-11-15 MED ORDER — AMLODIPINE BESYLATE 5 MG PO TABS
10.0000 mg | ORAL_TABLET | Freq: Every day | ORAL | Status: DC
  Administered 2014-11-15 – 2014-11-16 (×2): 10 mg via ORAL
  Filled 2014-11-15 (×2): qty 2

## 2014-11-15 MED ORDER — PIPERACILLIN-TAZOBACTAM 3.375 G IVPB
3.3750 g | Freq: Three times a day (TID) | INTRAVENOUS | Status: DC
  Filled 2014-11-15 (×4): qty 50

## 2014-11-15 MED ORDER — SIMVASTATIN 20 MG PO TABS
20.0000 mg | ORAL_TABLET | Freq: Every day | ORAL | Status: DC
  Administered 2014-11-15 – 2014-11-16 (×2): 20 mg via ORAL
  Filled 2014-11-15 (×2): qty 1

## 2014-11-15 MED ORDER — INSULIN ASPART 100 UNIT/ML ~~LOC~~ SOLN
0.0000 [IU] | Freq: Every day | SUBCUTANEOUS | Status: DC
  Administered 2014-11-15: 4 [IU] via SUBCUTANEOUS
  Administered 2014-11-15: 5 [IU] via SUBCUTANEOUS

## 2014-11-15 MED ORDER — ENALAPRIL MALEATE 5 MG PO TABS
5.0000 mg | ORAL_TABLET | Freq: Every day | ORAL | Status: DC
  Administered 2014-11-15 – 2014-11-16 (×2): 5 mg via ORAL
  Filled 2014-11-15 (×2): qty 1

## 2014-11-15 MED ORDER — ONDANSETRON HCL 4 MG PO TABS: 4.0000 mg | ORAL_TABLET | Freq: Four times a day (QID) | ORAL | Status: DC | PRN

## 2014-11-15 MED ORDER — INSULIN ASPART 100 UNIT/ML ~~LOC~~ SOLN
0.0000 [IU] | Freq: Three times a day (TID) | SUBCUTANEOUS | Status: DC
  Administered 2014-11-15: 7 [IU] via SUBCUTANEOUS
  Administered 2014-11-15: 3 [IU] via SUBCUTANEOUS
  Administered 2014-11-15: 2 [IU] via SUBCUTANEOUS
  Administered 2014-11-16: 7 [IU] via SUBCUTANEOUS
  Administered 2014-11-16: 2 [IU] via SUBCUTANEOUS

## 2014-11-15 MED ORDER — SODIUM CHLORIDE 0.9 % IJ SOLN: 3.0000 mL | INTRAMUSCULAR | Status: DC | PRN

## 2014-11-15 MED ORDER — OXYCODONE-ACETAMINOPHEN 5-325 MG PO TABS
ORAL_TABLET | ORAL | Status: DC | PRN
  Administered 2014-11-15: 1 via ORAL
  Filled 2014-11-15: qty 1

## 2014-11-15 MED ORDER — TACROLIMUS 1 MG PO CAPS
ORAL_CAPSULE | ORAL | Status: AC
  Filled 2014-11-15: qty 2

## 2014-11-15 MED ORDER — MYCOPHENOLATE MOFETIL 250 MG PO CAPS
ORAL_CAPSULE | ORAL | Status: AC
  Filled 2014-11-15: qty 4

## 2014-11-15 MED ORDER — SODIUM CHLORIDE 0.9 % IJ SOLN
3.0000 mL | Freq: Two times a day (BID) | INTRAMUSCULAR | Status: DC
Start: 2014-11-15 — End: 2014-11-16
  Administered 2014-11-15 – 2014-11-16 (×4): 3 mL via INTRAVENOUS

## 2014-11-15 MED ORDER — ASPIRIN EC 81 MG PO TBEC
81.0000 mg | DELAYED_RELEASE_TABLET | Freq: Every day | ORAL | Status: DC
  Administered 2014-11-15 – 2014-11-16 (×2): 81 mg via ORAL
  Filled 2014-11-15 (×2): qty 1

## 2014-11-15 MED ORDER — PREDNISONE 10 MG PO TABS
5.0000 mg | ORAL_TABLET | Freq: Every day | ORAL | Status: DC
  Administered 2014-11-15 – 2014-11-16 (×2): 5 mg via ORAL
  Filled 2014-11-15 (×2): qty 1

## 2014-11-15 MED ORDER — TACROLIMUS 1 MG PO CAPS
2.0000 mg | ORAL_CAPSULE | Freq: Two times a day (BID) | ORAL | Status: DC
  Administered 2014-11-15 – 2014-11-16 (×4): 2 mg via ORAL
  Filled 2014-11-15 (×10): qty 2

## 2014-11-15 MED ORDER — ONDANSETRON HCL 4 MG/2ML IJ SOLN: 4.0000 mg | Freq: Four times a day (QID) | INTRAMUSCULAR | Status: DC | PRN

## 2014-11-15 MED ORDER — VANCOMYCIN HCL IN DEXTROSE 1-5 GM/200ML-% IV SOLN
1000.0000 mg | Freq: Two times a day (BID) | INTRAVENOUS | Status: DC
  Administered 2014-11-15: 1000 mg via INTRAVENOUS
  Filled 2014-11-15 (×3): qty 200

## 2014-11-15 NOTE — Progress Notes (Signed)
ANTIBIOTIC CONSULT NOTE - FOLLOW UP  Pharmacy Consult for Vancomycin and Zosyn Indication: rule out sepsis  No Known Allergies  Patient Measurements: Height:  (162.6 cm) Weight: 154 lb 15.8 oz (70.302 kg) IBW/kg (Calculated) : 59.2  Vital Signs: Temp: 99.6 F (37.6 C) (09/29 0400) Temp Source: Oral (09/29 0400) BP: 116/86 mmHg (09/29 0400) Pulse Rate: 83 (09/29 0400) Intake/Output from previous day:   Intake/Output from this shift:    Labs:  Recent Labs  11/14/14 2140 11/15/14 0508  WBC 6.6 6.4  HGB 13.8 13.4  PLT 155 151  CREATININE 1.19 1.09   Estimated Creatinine Clearance: 69.4 mL/min (by C-G formula based on Cr of 1.09). No results for input(s): VANCOTROUGH, VANCOPEAK, VANCORANDOM, GENTTROUGH, GENTPEAK, GENTRANDOM, TOBRATROUGH, TOBRAPEAK, TOBRARND, AMIKACINPEAK, AMIKACINTROU, AMIKACIN in the last 72 hours.   Microbiology: Recent Results (from the past 720 hour(s))  Blood culture (routine x 2)     Status: None (Preliminary result)   Collection Time: 11/14/14  9:45 PM  Result Value Ref Range Status   Specimen Description BLOOD RIGHT HAND  Final   Special Requests BOTTLES DRAWN AEROBIC AND ANAEROBIC 6CC  Final   Culture PENDING  Incomplete   Report Status PENDING  Incomplete  Blood culture (routine x 2)     Status: None (Preliminary result)   Collection Time: 11/14/14  9:52 PM  Result Value Ref Range Status   Specimen Description BLOOD RIGHT HAND  Final   Special Requests BOTTLES DRAWN AEROBIC AND ANAEROBIC 6CC  Final   Culture PENDING  Incomplete   Report Status PENDING  Incomplete    Anti-infectives    Start     Dose/Rate Route Frequency Ordered Stop   11/15/14 0900  vancomycin (VANCOCIN) IVPB 1000 mg/200 mL premix     1,000 mg 200 mL/hr over 60 Minutes Intravenous Every 12 hours 11/15/14 0810     11/14/14 2130  piperacillin-tazobactam (ZOSYN) IVPB 3.375 g     3.375 g 100 mL/hr over 30 Minutes Intravenous  Once 11/14/14 2126 11/14/14 2306   11/14/14 2130  vancomycin (VANCOCIN) IVPB 1000 mg/200 mL premix     1,000 mg 200 mL/hr over 60 Minutes Intravenous  Once 11/14/14 2126 11/14/14 2306     Assessment: 49 yo male h/o renal transplant at [facility] in 2007 for polycystic kidney disease, htn, dm comes in with less than one day of fever and general body aches. He denies any sick contacts. No abdominal pain, no n/v/d. No cough, chest pain or abdominal pain. No rashes. No urinary symptoms. Pt has not required hospitalization in years, and has been doing very well since his transplant. Pt referrred for admission for fever in transplant status.  Estimated Creatinine Clearance: 69.4 mL/min (by C-G formula based on Cr of 1.09).  Blood cx pending  Goal of Therapy:  Vancomycin trough level ~ 15 mcg/ml  Plan:  Vancomycin  IV q12hrs Check trough at steady state Zosyn 3.375gm IV q8h, each dose over 4 hrs Deescalate ABX when appropriate / reduce risk of Vanc associated kidney injury Monitor labs, renal fxn, and c/s  Margo Aye, Scott A 11/15/2014,8:18 AM

## 2014-11-15 NOTE — Progress Notes (Signed)
Inpatient Diabetes Program Recommendations  AACE/ADA: New Consensus Statement on Inpatient Glycemic Control (2015)  Target Ranges:  Prepandial:   less than 140 mg/dL      Peak postprandial:   less than 180 mg/dL (1-2 hours)      Critically ill patients:  140 - 180 mg/dL  Results for PER, BEAGLEY (MRN [id number]) as of 11/15/2014 09:27  Ref. Range 11/15/2014 00:51 11/15/2014 07:18  Glucose-Capillary Latest Ref Range: 65-99 mg/dL 782 (H) 956 (H)   Review of Glycemic Control  Diabetes history: DM2 Outpatient Diabetes medications: Metformin XR 1000 mg BID, Amaryl 2 mg QAM Current orders for Inpatient glycemic control: Novolog 0-9 units TID with meals, Novolog 0-5 units HS  Inpatient Diabetes Program Recommendations: Correction (SSI): Please consider increasing Novolog to moderate correction scale.   Thanks, Orlando Penner, RN, MSN, CCRN, CDE Diabetes Coordinator Inpatient Diabetes Program [phone] (Team Pager from 8am to 5pm) [phone] ([facility] office) [phone] [facility] office) [phone] [facility] office)

## 2014-11-15 NOTE — Progress Notes (Signed)
Patient refused SCDs.  Patient was made aware of the benefits of SCDs and the risks without them.

## 2014-11-15 NOTE — Progress Notes (Signed)
PROGRESS NOTE  [name] ZOX:[id number] DOB: Jun 15, 1965 DOA: 11/14/2014 PCP: Milana Obey, MD  Summary: 49 yo male with a history of a renal transplant at [facility] in 2007 for polycystic kidney disease, HTN, and DM presented with a fever and generalized body aches. WBC, Lactic acid,  UA, and CXR unremarkable. He was admitted for further management of a fever with suspicion for viral illness  Assessment/Plan: 1. Fever, etiology unclear with possible SIRS on admission. Favor viral. CXR, UA, lactic acid, flu screen and WBC all WNL. No localizing signs or symptoms. Blood and urine cultures pending. No evidence of sepsis.  2. PCKD s/p renal transplant 2007, on prednisone, tacrolimus. Excellent renal function.  3. HTN, stable. Continue home meds. 4. DM type 2. Stable. Continue SSI. On metformin, glimerpiride at home   Overall mildly improved, favor viral infection, no evidence of bacterial infeciton. No localization or source.  Plan to stop IV abx and observe.  Follow up blood and urine cultures.   Possibly home in am if improved  Code Status: Full DVT prophylaxis: SCDs Family Communication: Discussed  Disposition Plan: Discharge home with improvement  Brendia Sacks, MD  [facility]  Pager [phone] If 7PM-7AM, please contact night-coverage at [email or web address], password [facility] 11/15/2014, 6:41 AM    Consultants:    Procedures:    Antibiotics:  Zosyn 9/28>> 9/29  Vancomycin 9/28>> 9/29  HPI/Subjective: Feeling about the same. Reports back pain, that is chronic. No SOB, cough, rash, abdominal pain, nausea, or vomiting. Reports constipation. Appetite okay. Reports fatigue and leg pain two days ago.   Objective: Filed Vitals:   11/14/14 2330 11/15/14 0000 11/15/14 0028 11/15/14 0400  BP: 116/86 114/88 98/61 116/86  Pulse: 95 88 83 83  Temp: 99 F (37.2 C)  99 F (37.2 C) 99.6 F (37.6 C)  TempSrc: Oral  Oral Oral  Resp: Height:    (1.626  m)   Weight:   70.302 kg (154 lb 15.8 oz) 70.302 kg (154 lb 15.8 oz)  SpO2: 97% 95% 97% 98%   No intake or output data in the 24 hours ending 11/15/14 0641   Filed Weights   11/14/14 2058 11/15/14 0028 11/15/14 0400  Weight: 70.308 kg (155 lb) 70.302 kg (154 lb 15.8 oz) 70.302 kg (154 lb 15.8 oz)    Exam:    VSS, afebrile, not hypoxic  General:  Appears calm and comfortable Eyes: PERRL, normal lids, irises & conjunctiva ENT: grossly normal hearing, lips & tongue Cardiovascular: RRR, no m/r/g. No LE edema.  Telemetry SR Respiratory: CTA bilaterally, no w/r/r. Normal respiratory effort. Abdomen: soft, ntnd Skin: no rash or induration seen on limited exam Musculoskeletal: grossly normal tone BUE/BLE Psychiatric: grossly normal mood and affect, speech fluent and appropriate Neurologic: grossly non-focal.  New data reviewed:  BMP unremarkable, CBG stable  CBC unremarkable  Influenza panel negative  Pertinent data since admission:  CXR IMPRESSION: Subsegmental atelectasis. No indication of pneumonia.  Pending data:  BC, UC  Scheduled Meds: . amLODipine  10 mg Oral Daily  . aspirin EC  81 mg Oral Daily  . enalapril  5 mg Oral Daily  . insulin aspart  0-5 Units Subcutaneous QHS  . insulin aspart  0-9 Units Subcutaneous TID WC  . mycophenolate  1,000 mg Oral BID  . predniSONE  5 mg Oral Daily  . simvastatin  20 mg Oral Daily  . sodium chloride  3 mL Intravenous Q12H  . sodium chloride  3 mL  Intravenous Q12H  . tacrolimus  2 mg Oral BID   Continuous Infusions:   Principal Problem:   Fever Active Problems:   SIRS (systemic inflammatory response syndrome)   Renal transplant recipient 2007 at [facility]   Diabetes mellitus   Hypertension   Polycystic kidney disease   History of renal transplant   Time spent 25 minutes  By signing my name below, I, Burnett Harry attest that this documentation has been prepared under the direction and in the presence of  Brendia Sacks, MD Electronically signed: Burnett Harry, Scribe.  11/15/2014 9:42 AM   I personally performed the services described in this documentation. All medical record entries made by the scribe were at my direction. I have reviewed the chart and agree that the record reflects my personal performance and is accurate and complete. Brendia Sacks, MD

## 2014-11-15 NOTE — H&P (Signed)
PCP:   Robert Bellow, MD   Chief Complaint:  fever  HPI: 49 yo male h/o renal transplant at [facility] in 2007 for polycystic kidney disease, htn, dm comes in with less than one day of fever and general body aches.  He denies any sick contacts.  No abdominal pain, no n/v/d.  No cough, chest pain or abdominal pain.  No rashes.  No urinary symptoms.  Pt has not required hospitalization in years, and has been doing very well since his transplant.  Pt referrred for admission for fever in transplant status.  Review of Systems:  Positive and negative as per HPI otherwise all other systems are negative  Past Medical History: Past Medical History  Diagnosis Date  . Diabetes mellitus   . Hypertension   . Polycystic kidney disease   . DDD (degenerative disc disease), lumbar    Past Surgical History  Procedure Laterality Date  . Nephrectomy transplanted organ      Medications: Prior to Admission medications   Medication Sig Start Date End Date Taking? Authorizing Provider  amLODipine (NORVASC) 10 MG tablet Take 10 mg by mouth daily.   Yes Historical Provider, MD  aspirin EC 81 MG tablet Take 81 mg by mouth daily.   Yes Historical Provider, MD  enalapril (VASOTEC) 5 MG tablet Take 5 mg by mouth daily.   Yes Historical Provider, MD  glimepiride (AMARYL) 2 MG tablet Take 2 mg by mouth daily with breakfast.   Yes Historical Provider, MD  metFORMIN (GLUCOPHAGE-XR) 500 MG 24 hr tablet Take 1,000 mg by mouth 2 (two) times daily. 08/06/14  Yes Historical Provider, MD  mycophenolate (CELLCEPT) 250 MG capsule Take 1,000 mg by mouth 2 (two) times daily.   Yes Historical Provider, MD  omeprazole (PRILOSEC) 20 MG capsule Take 20 mg by mouth daily as needed (for acid reflux).    Yes Historical Provider, MD  oxyCODONE-acetaminophen (PERCOCET) 10-325 MG per tablet Take 1 tablet by mouth every 4 (four) hours as needed for pain.  08/15/14  Yes Historical Provider, MD  predniSONE (DELTASONE) 5 MG tablet Take  5 mg by mouth daily.   Yes Historical Provider, MD  simvastatin (ZOCOR) 20 MG tablet Take 20 mg by mouth daily.   Yes Historical Provider, MD  tacrolimus (PROGRAF) 1 MG capsule Take 2 mg by mouth 2 (two) times daily.   Yes Historical Provider, MD  tetrahydrozoline-zinc (VISINE-AC) 0.05-0.25 % ophthalmic solution Place 2 drops into both eyes 3 (three) times daily as needed. Dry/Red Eyes   Yes Historical Provider, MD    Allergies:  No Known Allergies  Social History:  reports that he has never smoked. He does not have any smokeless tobacco history on file. He reports that he does not drink alcohol or use illicit drugs.  Family History: Multiple people in family with kidney problems  Physical Exam: Filed Vitals:   11/14/14 2300 11/14/14 2330 11/15/14 0000 11/15/14 0028  BP: 100/69 116/86 114/88 98/61  Pulse: 91 95 88 83  Temp:  99 F (37.2 C)  99 F (37.2 C)  TempSrc:  Oral  Oral  Resp:  _0 Height:    _1  (1.626 m)  Weight:    70.302 kg (154 lb 15.8 oz)  SpO2: 97% 97% 95% 97%   General appearance: alert, cooperative and no distress Head: Normocephalic, without obvious abnormality, atraumatic Eyes: negative Nose: Nares normal. Septum midline. Mucosa normal. No drainage or sinus tenderness. Neck: no JVD and supple, symmetrical, trachea midline  Lungs: clear to auscultation bilaterally Heart: regular rate and rhythm, S1, S2 normal, no murmur, click, rub or gallop Abdomen: soft, non-tender; bowel sounds normal; no masses,  no organomegaly Extremities: extremities normal, atraumatic, no cyanosis or edema Pulses: 2+ and symmetric Skin: Skin color, texture, turgor normal. No rashes or lesions Neurologic: Grossly normal  Labs on Admission:   Recent Labs  11/14/14 2140  NA 134*  K 4.0  CL 105  CO2 22  GLUCOSE 294*  BUN 22*  CREATININE 1.19  CALCIUM 9.0    Recent Labs  11/14/14 2140  AST 18  ALT 13*  ALKPHOS 65  BILITOT 0.5  PROT 7.1  ALBUMIN 3.9     Recent Labs  11/14/14 2140  WBC 6.6  NEUTROABS 3.9  HGB 13.8  HCT 41.7  MCV 82.7  PLT 155    Recent Labs  11/14/14 2140  CKTOTAL 172   Radiological Exams on Admission: Dg Chest 2 View  11/14/2014   CLINICAL DATA:  Fever and body aches  EXAM: CHEST  2 VIEW  COMPARISON:  08/29/2014  FINDINGS: Mild hypoventilation with atelectasis. There is no edema, consolidation, effusion, or pneumothorax. Normal heart size and mediastinal contours.  IMPRESSION: Subsegmental atelectasis.  No indication of pneumonia.   Electronically Signed   By: Monte Fantasia M.D.   On: 11/14/2014 23:24   Old chart reviewed Case discussed with dr Wyvonnia Dusky in the ED cxr reviewed no infiltrate or edema  Assessment/Plan  49 yo male h/o renal transplant comes in with fever of unclear etiology  Principal Problem:   Fever-  ua and cxr normal.  No rashes.  Quick flu pending.  Lactic acid level normal.  Empirically cover with iv vanc/zosyn, blood cultures pending.  Likely viral however, due to his immunosuppressed status will watch closely for development of sepsis.  Vss.  Treat fever with apap.  Active Problems:   SIRS (systemic inflammatory response syndrome)- criteria met.  Noted.   Renal transplant recipient 2007 at [facility]- noted, have consulted nephro for in the am.  Will not hold his prednisone, prograf or cellcept at this time.   Diabetes mellitus-  Place on ssi   Hypertension- noted   Polycystic kidney disease- noted  obs on tele bed.  Full code. Pt seen before midnight DAVID,RACHAL A 11/15/2014, 12:38 AM

## 2014-11-16 DIAGNOSIS — R509 Fever, unspecified: Secondary | ICD-10-CM | POA: Diagnosis not present

## 2014-11-16 DIAGNOSIS — E119 Type 2 diabetes mellitus without complications: Secondary | ICD-10-CM | POA: Diagnosis not present

## 2014-11-16 DIAGNOSIS — A419 Sepsis, unspecified organism: Secondary | ICD-10-CM

## 2014-11-16 DIAGNOSIS — Z940 Kidney transplant status: Secondary | ICD-10-CM | POA: Diagnosis not present

## 2014-11-16 LAB — GLUCOSE, CAPILLARY
GLUCOSE-CAPILLARY: 196 mg/dL — AB (ref 65–99)
GLUCOSE-CAPILLARY: 327 mg/dL — AB (ref 65–99)
Glucose-Capillary: 378 mg/dL — ABNORMAL HIGH (ref 65–99)

## 2014-11-16 LAB — HEMOGLOBIN A1C
HEMOGLOBIN A1C: 9.1 % — AB (ref 4.8–5.6)
Mean Plasma Glucose: 214 mg/dL

## 2014-11-16 LAB — URINE CULTURE: CULTURE: NO GROWTH

## 2014-11-16 NOTE — Progress Notes (Signed)
Pt discharged to home with his family in a wheelchair. VS stable.  No pain reported.  2 PIV removed, catheter intact, no S&S of infection noted.  Discharge instructions reviewed with pt.  Pt had no questions.

## 2014-11-16 NOTE — Progress Notes (Signed)
PROGRESS NOTE  [name] ZOX:[id number] DOB: September 08, 1965 DOA: 11/14/2014 PCP: Milana Obey, MD  Summary: 49 yo male with a history of a renal transplant at [facility] in 2007 for polycystic kidney disease, HTN, and DM presented with a fever and generalized body aches. WBC, Lactic acid,  UA, and CXR unremarkable. He was admitted for further management of a fever with suspicion for viral illness.  Assessment/Plan: 1. Fever, etiology unclear with possible SIRS on admission. Suspect viral illness. No localizing signs or symptoms. Muscle aches have resolved. Blood and urine cultures still pending. No evidence of sepsis. Remains afebrile off of antibiotics.  2. PCKD s/p renal transplant 2007, on prednisone, tacrolimus. Excellent renal function.  3. HTN, stable. Will continue home meds. 4. DM type 2. Stable. A1c Will continue SSI. On metformin, glimerpiride at home   Overall improved, discharge home today. There is no evidence to suggest bacterial infection.     Follow up blood and urine cultures.    Code Status: Full DVT prophylaxis: SCDs Family discussion: Discussed plan in detail. No further concerns at this time. Disposition Plan: Discharge home today.  Brendia Sacks, MD  [facility]  Pager [phone] If 7PM-7AM, please contact night-coverage at [email or web address], password [facility] 11/16/2014, 7:58 AM    Consultants:    Procedures:    Antibiotics:  Zosyn 9/28>> 9/29  Vancomycin 9/28>> 9/29  HPI/Subjective: Feeling better today. No fever. Tolerating food okay. One episode of sputum production. No nausea, vomiting. Back pain secondary to prolonged lying. No muscle aches. No other pain.  Objective: Filed Vitals:   11/15/14 0400 11/15/14 1322 11/15/14 2100 11/16/14 0537  BP: 116/86 125/84 120/82 130/79  Pulse: 83 91 84 74  Temp: 99.6 F (37.6 C) 98.9 F (37.2 C) 98.9 F (37.2 C) 97.9 F (36.6 C)  TempSrc: Oral Oral Oral Oral  Resp: Height:        Weight: 70.302 kg (154 lb 15.8 oz)     SpO2: 98% 98% 99% 98%    Intake/Output Summary (Last 24 hours) at 11/16/14 0758 Last data filed at 11/15/14 1801  Gross per 24 hour  Intake    600 ml  Output      0 ml  Net    600 ml     Filed Weights   11/14/14 2058 11/15/14 0028 11/15/14 0400  Weight: 70.308 kg (155 lb) 70.302 kg (154 lb 15.8 oz) 70.302 kg (154 lb 15.8 oz)    Exam:    VSS, afebrile, not hypoxic General:  Appears comfortable, calm. Eyes: PERRL, normal lids, irises ENT: grossly normal hearing, lips, tongue Cardiovascular: Regular rate and rhythm, no murmur, rub or gallop. No lower extremity edema. Respiratory: Clear to auscultation bilaterally, no wheezes, rales or rhonchi. Normal respiratory effort. Abdomen: soft, ntnd Musculoskeletal: grossly normal tone bilateral upper and lower extremities Psychiatric: grossly normal mood and affect, speech fluent and appropriate Neurologic: grossly non-focal.  New data reviewed:  CBG somewhat elevated  Pertinent data since admission:  CXR IMPRESSION: Subsegmental atelectasis. No indication of pneumonia.  Pending data:  BC, UC  Scheduled Meds: . amLODipine  10 mg Oral Daily  . aspirin EC  81 mg Oral Daily  . enalapril  5 mg Oral Daily  . insulin aspart  0-5 Units Subcutaneous QHS  . insulin aspart  0-9 Units Subcutaneous TID WC  . mycophenolate  1,000 mg Oral BID  . polyethylene glycol  17 g Oral BID  . predniSONE  5 mg Oral Daily  .  simvastatin  20 mg Oral Daily  . sodium chloride  3 mL Intravenous Q12H  . sodium chloride  3 mL Intravenous Q12H  . tacrolimus  2 mg Oral BID   Continuous Infusions:   Principal Problem:   Fever Active Problems:   SIRS (systemic inflammatory response syndrome)   Renal transplant recipient 2007 at [facility]   Diabetes mellitus   Hypertension   Polycystic kidney disease   History of renal transplant    By signing my name below, I, [name] attest that this  documentation has been prepared under the direction and in the presence of Brendia Sacks, MD   Electronically signed: [name]  11/16/2014 12:25 PM   I personally performed the services described in this documentation. All medical record entries made by the scribe were at my direction. I have reviewed the chart and agree that the record reflects my personal performance and is accurate and complete. Brendia Sacks, MD

## 2014-11-16 NOTE — Progress Notes (Signed)
Inpatient Diabetes Program Recommendations  AACE/ADA: New Consensus Statement on Inpatient Glycemic Control (2015)  Target Ranges:  Prepandial:   less than 140 mg/dL      Peak postprandial:   less than 180 mg/dL (1-2 hours)      Critically ill patients:  140 - 180 mg/dL  Results for ZEPHAN, BEAUCHAINE (MRN [id number]) as of 11/16/2014 08:16  Ref. Range 11/15/2014 00:51 11/15/2014 07:18 11/15/2014 11:33 11/15/2014 16:18 11/15/2014 21:04  Glucose-Capillary Latest Ref Range: 65-99 mg/dL 782 (H) 956 (H) 213 (H) 308 (H) 378 (H)   Review of Glycemic Control Diabetes history: DM2 Outpatient Diabetes medications: Metformin XR 1000 mg BID, Amaryl 2 mg QAM Current orders for Inpatient glycemic control: Novolog 0-9 units TID with meals, Novolog 0-5 units HS  Inpatient Diabetes Program Recommendations: Insulin - Basal: While inpatient, please consider ordering Lantus 7 units daily starting now (based on 70 kg x 0.1 units). Correction (SSI): Please consider increasing Novolog correction to moderate scale. Insulin - Meal Coverage: Please consider ordering Novolog 3 units TID with meals for meal coverage. HgbA1C: A1C 9.1% on 11/14/14 indicating poor outpatient glycemic control.   Thanks, Orlando Penner, RN, MSN, CCRN, CDE Diabetes Coordinator Inpatient Diabetes Program [phone] (Team Pager from 8am to 5pm) [phone] ([facility] office) [phone] [facility] office) [phone] [facility] office)

## 2014-11-16 NOTE — Care Management Note (Signed)
Case Management Note  Patient Details  Name: [name] MRN: [id number] Date of Birth: 28-Feb-1965  Subjective/Objective:                  Pt admitted from home with fever. Pt lives with family and will return home at discharge. Pt is independent with ADL's. Action/Plan: No CM needs anticipated. Anticipate discharge today.  Expected Discharge Date:                  Expected Discharge Plan:  Home/Self Care  In-House Referral:  NA  Discharge planning Services  CM Consult  Post Acute Care Choice:  NA Choice offered to:  NA  DME Arranged:    DME Agency:     HH Arranged:    HH Agency:     Status of Service:  Completed, signed off  Medicare Important Message Given:    Date Medicare IM Given:    Medicare IM give by:    Date Additional Medicare IM Given:    Additional Medicare Important Message give by:     If discussed at Long Length of Stay Meetings, dates discussed:    Additional Comments:  Cheryl Flash, RN 11/16/2014, 10:43 AM

## 2014-11-16 NOTE — Discharge Summary (Signed)
Physician Discharge Summary  [name] ZOX:[id number] DOB: 1965-07-21 DOA: 11/14/2014  PCP: Milana Obey, MD  Admit date: 11/14/2014 Discharge date: 11/16/2014  Recommendations for Outpatient Follow-up:  1. Follow up with PCP in 1-2 weeks  Follow-up Information    Follow up with Milana Obey, MD.   Specialty:  Family Medicine   Why:  As needed   Contact information:   [address] [place] [place] [address] [phone]        Discharge Diagnoses:  1. Fever 2. Possible SIRS 3. PCKD s/p renal transplant 2007. 4. HTN  5. DM type 2.  Discharge Condition: Improved Disposition: Home  Diet recommendation: Regular  Filed Weights   11/14/14 2058 11/15/14 0028 11/15/14 0400  Weight: 70.308 kg (155 lb) 70.302 kg (154 lb 15.8 oz) 70.302 kg (154 lb 15.8 oz)    History of present illness:  49 yo male with a history of a renal transplant at [facility] in 2007 for polycystic kidney disease, HTN, and DM presented with a fever and generalized body aches. WBC, Lactic acid, UA, and CXR unremarkable. He was admitted for further management of a fever with suspicion for viral illness.  Hospital Course:  Patient got 1 dose of antibiotics which were then withheld on suspicion of viral illness. Urinalysis, chest x-ray were unremarkable. Blood cultures no growth to date. Influenza screen was negative. Urine culture was negative. No localizing signs or symptoms. There was no evidence of sepsis or bacterial infection. He remained afebrile off antibiotics and is at this point stable for discharge.  1. Fever, etiology unclear with possible SIRS on admission. Suspect viral illness. No localizing signs or symptoms. Muscle aches have resolved. Blood and urine cultures still pending. No evidence of sepsis. Remains afebrile off of antibiotics.  2. PCKD s/p renal transplant 2007, on prednisone, tacrolimus. Excellent renal function.  3. HTN, stable. Will continue home meds. 4. DM type 2.  Stable. A1c Will continue SSI. On metformin, glimerpiride at home  Consultants:  none  Procedures:  none  Antibiotics:  Zosyn 9/28>> 9/29  Vancomycin 9/28>> 9/29  Discharge Instructions Discharge Instructions    Activity as tolerated - No restrictions    Complete by:  As directed      Diet Carb Modified    Complete by:  As directed      Discharge instructions    Complete by:  As directed   Call your physician or seek immediate medical attention for fever, shortness of breath, cough, vomiting, pain or worsening of condition.            Discharge Medication List as of 11/16/2014 12:46 PM    CONTINUE these medications which have NOT CHANGED   Details  amLODipine (NORVASC) 10 MG tablet Take 10 mg by mouth daily., Until Discontinued, Historical Med    aspirin EC 81 MG tablet Take 81 mg by mouth daily., Until Discontinued, Historical Med    enalapril (VASOTEC) 5 MG tablet Take 5 mg by mouth daily., Until Discontinued, Historical Med    glimepiride (AMARYL) 2 MG tablet Take 2 mg by mouth daily with breakfast., Until Discontinued, Historical Med    metFORMIN (GLUCOPHAGE-XR) 500 MG 24 hr tablet Take 1,000 mg by mouth 2 (two) times daily., Starting 08/06/2014, Until Discontinued, Historical Med    mycophenolate (CELLCEPT) 250 MG capsule Take 1,000 mg by mouth 2 (two) times daily., Until Discontinued, Historical Med    omeprazole (PRILOSEC) 20 MG capsule Take 20 mg by mouth daily as needed (for acid reflux). ,  Until Discontinued, Historical Med    oxyCODONE-acetaminophen (PERCOCET) 10-325 MG per tablet Take 1 tablet by mouth every 4 (four) hours as needed for pain. , Starting 08/15/2014, Until Discontinued, Historical Med    predniSONE (DELTASONE) 5 MG tablet Take 5 mg by mouth daily., Until Discontinued, Historical Med    simvastatin (ZOCOR) 20 MG tablet Take 20 mg by mouth daily., Until Discontinued, Historical Med    tacrolimus (PROGRAF) 1 MG capsule Take 2 mg by mouth 2  (two) times daily., Until Discontinued, Historical Med    tetrahydrozoline-zinc (VISINE-AC) 0.05-0.25 % ophthalmic solution Place 2 drops into both eyes 3 (three) times daily as needed. Dry/Red Eyes, Until Discontinued, Historical Med       No Known Allergies  The results of significant diagnostics from this hospitalization (including imaging, microbiology, ancillary and laboratory) are listed below for reference.    Significant Diagnostic Studies: Dg Chest 2 View  11/14/2014   CLINICAL DATA:  Fever and body aches  EXAM: CHEST  2 VIEW  COMPARISON:  08/29/2014  FINDINGS: Mild hypoventilation with atelectasis. There is no edema, consolidation, effusion, or pneumothorax. Normal heart size and mediastinal contours.  IMPRESSION: Subsegmental atelectasis.  No indication of pneumonia.   Electronically Signed   By: Marnee Spring M.D.   On: 11/14/2014 23:24    Microbiology: Recent Results (from the past 240 hour(s))  Urine culture     Status: None   Collection Time: 11/14/14  9:34 PM  Result Value Ref Range Status   Specimen Description URINE, CLEAN CATCH  Final   Special Requests NONE  Final   Culture   Final    NO GROWTH 1 DAY Performed at [facility]    Report Status 11/16/2014 FINAL  Final  Blood culture (routine x 2)     Status: None (Preliminary result)   Collection Time: 11/14/14  9:45 PM  Result Value Ref Range Status   Specimen Description BLOOD RIGHT HAND  Final   Special Requests BOTTLES DRAWN AEROBIC AND ANAEROBIC 6CC  Final   Culture NO GROWTH < 12 HOURS  Final   Report Status PENDING  Incomplete  Blood culture (routine x 2)     Status: None (Preliminary result)   Collection Time: 11/14/14  9:52 PM  Result Value Ref Range Status   Specimen Description BLOOD RIGHT HAND  Final   Special Requests BOTTLES DRAWN AEROBIC AND ANAEROBIC 6CC  Final   Culture NO GROWTH < 12 HOURS  Final   Report Status PENDING  Incomplete     Labs: Basic Metabolic Panel:  Recent  Labs Lab 11/14/14 2140 11/15/14 0508  NA 134* 139  K 4.0 3.8  CL 105 106  CO2 22 23  GLUCOSE 294* 138*  BUN 22* 17  CREATININE 1.19 1.09  CALCIUM 9.0 9.2   Liver Function Tests:  Recent Labs Lab 11/14/14 2140  AST 18  ALT 13*  ALKPHOS 65  BILITOT 0.5  PROT 7.1  ALBUMIN 3.9    CBC:  Recent Labs Lab 11/14/14 2140 11/15/14 0508  WBC 6.6 6.4  NEUTROABS 3.9  --   HGB 13.8 13.4  HCT 41.7 41.3  MCV 82.7 83.8  PLT 155 151   Cardiac Enzymes:  Recent Labs Lab 11/14/14 2140  CKTOTAL 172    CBG:  Recent Labs Lab 11/15/14 1133 11/15/14 1618 11/15/14 2104 11/16/14 0731 11/16/14 1103  GLUCAP 219* 308* 378* 196* 327*    Principal Problem:   Fever Active Problems:   SIRS (systemic  inflammatory response syndrome)   Renal transplant recipient 2007 at [facility]   Diabetes mellitus   Hypertension   Polycystic kidney disease   History of renal transplant   Time coordinating discharge: 35 minutes  Signed:  Brendia Sacks, MD [facility] 11/16/2014, 7:57 AM  By signing my name below, I, [name] attest that this documentation has been prepared under the direction and in the presence of Brendia Sacks, MD   Electronically signed: [name]  11/16/2014 12:25 PM  I personally performed the services described in this documentation. All medical record entries made by the scribe were at my direction. I have reviewed the chart and agree that the record reflects my personal performance and is accurate and complete. Brendia Sacks, MD

## 2014-11-19 LAB — CULTURE, BLOOD (ROUTINE X 2)
Culture: NO GROWTH
Culture: NO GROWTH

## 2015-02-20 ENCOUNTER — Encounter (HOSPITAL_COMMUNITY): Payer: Self-pay | Admitting: *Deleted

## 2015-02-20 ENCOUNTER — Emergency Department (HOSPITAL_COMMUNITY)
Admission: EM | Admit: 2015-02-20 | Discharge: 2015-02-20 | Disposition: A | Discharge status: Home / Self Care | Payer: Medicaid Other | Attending: Emergency Medicine | Admitting: Emergency Medicine

## 2015-02-20 DIAGNOSIS — Z79899 Other long term (current) drug therapy: Secondary | ICD-10-CM | POA: Insufficient documentation

## 2015-02-20 DIAGNOSIS — Z7982 Long term (current) use of aspirin: Secondary | ICD-10-CM | POA: Insufficient documentation

## 2015-02-20 DIAGNOSIS — I10 Essential (primary) hypertension: Secondary | ICD-10-CM | POA: Insufficient documentation

## 2015-02-20 DIAGNOSIS — E860 Dehydration: Secondary | ICD-10-CM | POA: Diagnosis not present

## 2015-02-20 DIAGNOSIS — Z8739 Personal history of other diseases of the musculoskeletal system and connective tissue: Secondary | ICD-10-CM | POA: Insufficient documentation

## 2015-02-20 DIAGNOSIS — R109 Unspecified abdominal pain: Secondary | ICD-10-CM | POA: Diagnosis not present

## 2015-02-20 DIAGNOSIS — R197 Diarrhea, unspecified: Secondary | ICD-10-CM

## 2015-02-20 DIAGNOSIS — E119 Type 2 diabetes mellitus without complications: Secondary | ICD-10-CM | POA: Diagnosis not present

## 2015-02-20 DIAGNOSIS — Z7984 Long term (current) use of oral hypoglycemic drugs: Secondary | ICD-10-CM | POA: Diagnosis not present

## 2015-02-20 DIAGNOSIS — Z7952 Long term (current) use of systemic steroids: Secondary | ICD-10-CM | POA: Insufficient documentation

## 2015-02-20 DIAGNOSIS — Z87448 Personal history of other diseases of urinary system: Secondary | ICD-10-CM | POA: Insufficient documentation

## 2015-02-20 LAB — CBC WITH DIFFERENTIAL/PLATELET
BASOS ABS: 0 10*3/uL (ref 0.0–0.1)
BASOS PCT: 0 %
EOS ABS: 0.1 10*3/uL (ref 0.0–0.7)
Eosinophils Relative: 1 %
HEMATOCRIT: 42.9 % (ref 39.0–52.0)
HEMOGLOBIN: 13.6 g/dL (ref 13.0–17.0)
Lymphocytes Relative: 14 %
Lymphs Abs: 0.7 10*3/uL (ref 0.7–4.0)
MCH: 26.3 pg (ref 26.0–34.0)
MCHC: 31.7 g/dL (ref 30.0–36.0)
MCV: 83 fL (ref 78.0–100.0)
MONOS PCT: 12 %
Monocytes Absolute: 0.7 10*3/uL (ref 0.1–1.0)
NEUTROS ABS: 3.9 10*3/uL (ref 1.7–7.7)
NEUTROS PCT: 73 %
Platelets: 132 10*3/uL — ABNORMAL LOW (ref 150–400)
RBC: 5.17 MIL/uL (ref 4.22–5.81)
RDW: 14.3 % (ref 11.5–15.5)
WBC: 5.4 10*3/uL (ref 4.0–10.5)

## 2015-02-20 LAB — COMPREHENSIVE METABOLIC PANEL
ALBUMIN: 3.9 g/dL (ref 3.5–5.0)
ALK PHOS: 76 U/L (ref 38–126)
ALT: 13 U/L — ABNORMAL LOW (ref 17–63)
ANION GAP: 10 (ref 5–15)
AST: 13 U/L — AB (ref 15–41)
BUN: 24 mg/dL — ABNORMAL HIGH (ref 6–20)
CALCIUM: 9.6 mg/dL (ref 8.9–10.3)
CO2: 22 mmol/L (ref 22–32)
Chloride: 101 mmol/L (ref 101–111)
Creatinine, Ser: 1.32 mg/dL — ABNORMAL HIGH (ref 0.61–1.24)
GFR calc non Af Amer: >60 mL/min (ref >60)
Glucose, Bld: 280 mg/dL — ABNORMAL HIGH (ref 65–99)
Potassium: 4.1 mmol/L (ref 3.5–5.1)
SODIUM: 133 mmol/L — AB (ref 135–145)
TOTAL PROTEIN: 7.3 g/dL (ref 6.5–8.1)
Total Bilirubin: 1.1 mg/dL (ref 0.3–1.2)

## 2015-02-20 LAB — URINALYSIS, ROUTINE W REFLEX MICROSCOPIC
Glucose, UA: 250 mg/dL — AB
Hgb urine dipstick: NEGATIVE
KETONES UR: 15 mg/dL — AB
LEUKOCYTES UA: NEGATIVE
NITRITE: NEGATIVE
PROTEIN: 30 mg/dL — AB
Specific Gravity, Urine: >1.03 — ABNORMAL HIGH (ref 1.005–1.030)
pH: 5.5 (ref 5.0–8.0)

## 2015-02-20 LAB — URINE MICROSCOPIC-ADD ON: SQUAMOUS EPITHELIAL / LPF: NONE SEEN

## 2015-02-20 LAB — CBG MONITORING, ED: Glucose-Capillary: 262 mg/dL — ABNORMAL HIGH (ref 65–99)

## 2015-02-20 MED ORDER — SODIUM CHLORIDE 0.9 % IV BOLUS (SEPSIS)
500.0000 mL | Freq: Once | INTRAVENOUS | Status: AC
  Administered 2015-02-20: 500 mL via INTRAVENOUS

## 2015-02-20 MED ORDER — SODIUM CHLORIDE 0.9 % IV BOLUS (SEPSIS)
1000.0000 mL | Freq: Once | INTRAVENOUS | Status: AC
  Administered 2015-02-20: 1000 mL via INTRAVENOUS

## 2015-02-20 MED ORDER — MORPHINE SULFATE (PF) 2 MG/ML IV SOLN
2.0000 mg | Freq: Once | INTRAVENOUS | Status: AC
  Administered 2015-02-20: 2 mg via INTRAVENOUS
  Filled 2015-02-20: qty 1

## 2015-02-20 NOTE — ED Notes (Signed)
C/o weakness and no pain.

## 2015-02-20 NOTE — ED Notes (Signed)
Patient is resting comfortably. 

## 2015-02-20 NOTE — ED Notes (Signed)
Started with loose and watery stool on Saturday.  Had about 15 stool between Sunday and Monday.  Used imodium and stool stopped.  Started with fever yesterday about 100.0.  Took tylenol this am 0800.  Current temp 98.9.  Non- productive cough.

## 2015-02-20 NOTE — ED Provider Notes (Signed)
CSN: [id number]     Arrival date & time 02/20/15  [id number] History   First MD Initiated Contact with Patient 02/20/15 [phone]     Chief Complaint  Patient presents with  . Flu-like symptoms      (Consider location/radiation/quality/duration/timing/severity/associated sxs/prior Treatment) The history is provided by the patient.   [name] is a 50 y.o. male with a past medical history significant for diabetes, hypertension and renal transplant in 2007 presenting with a 5 day history of "flulike symptoms".  He endorses multiple loose watery nonbloody stools starting 5 days ago, stating approximately 15 BMs over the course of 3 days.  He took 4 doses of Imodium and has had no further diarrhea in the past 36 hours.  He has had low-grade fever to 100.3 yesterday evening which has been treated with Tylenol and adequately responds.  He denies nausea or vomiting, he has had a dry cough, denies shortness of breath or chest pain.  This morning he had fleeting sharp stabs of pain in his right abdomen near the site of his working kidney and was concerned for problems with his kidney.  He is urinating normally, his pain lasted for one hour and is now resolved.  At this point, his only symptom is generalized fatigue.  He is tolerating by mouth intake, drink cranberry juice prior to arrival without nausea, vomiting or resumption of abdominal pain.  He has had no lapse in his home medications including his antirejection medications.  He has not checked his CBGs in 2 days which generally are in the 200 range.  Past Medical History  Diagnosis Date  . Diabetes mellitus   . Hypertension   . Polycystic kidney disease   . DDD (degenerative disc disease), lumbar    Past Surgical History  Procedure Laterality Date  . Nephrectomy transplanted organ     No family history on file. Social History  Substance Use Topics  . Smoking status: Never Smoker   . Smokeless tobacco: None  . Alcohol Use: No     Comment: rarely     Review of Systems  Constitutional: Negative for fever.  HENT: Negative for congestion and sore throat.   Eyes: Negative.   Respiratory: Negative for chest tightness and shortness of breath.   Cardiovascular: Negative for chest pain.  Gastrointestinal: Positive for abdominal pain and diarrhea. Negative for nausea and vomiting.  Genitourinary: Negative.   Musculoskeletal: Negative for joint swelling, arthralgias and neck pain.  Skin: Negative.  Negative for rash and wound.  Neurological: Negative for dizziness, weakness, light-headedness, numbness and headaches.  Psychiatric/Behavioral: Negative.       Allergies  Review of patient's allergies indicates no known allergies.  Home Medications   Prior to Admission medications   Medication Sig Start Date End Date Taking? Authorizing Provider  amLODipine (NORVASC) 10 MG tablet Take 10 mg by mouth daily.    Historical Provider, MD  aspirin EC 81 MG tablet Take 81 mg by mouth daily.    Historical Provider, MD  enalapril (VASOTEC) 5 MG tablet Take 5 mg by mouth daily.    Historical Provider, MD  glimepiride (AMARYL) 2 MG tablet Take 2 mg by mouth daily with breakfast.    Historical Provider, MD  metFORMIN (GLUCOPHAGE-XR) 500 MG 24 hr tablet Take 1,000 mg by mouth 2 (two) times daily. 08/06/14   Historical Provider, MD  mycophenolate (CELLCEPT) 250 MG capsule Take 1,000 mg by mouth 2 (two) times daily.    Historical Provider, MD  omeprazole (PRILOSEC)  20 MG capsule Take 20 mg by mouth daily as needed (for acid reflux).     Historical Provider, MD  oxyCODONE-acetaminophen (PERCOCET) 10-325 MG per tablet Take 1 tablet by mouth every 4 (four) hours as needed for pain.  08/15/14   Historical Provider, MD  predniSONE (DELTASONE) 5 MG tablet Take 5 mg by mouth daily.    Historical Provider, MD  simvastatin (ZOCOR) 20 MG tablet Take 20 mg by mouth daily.    Historical Provider, MD  tacrolimus (PROGRAF) 1 MG capsule Take 2 mg by mouth 2 (two) times  daily.    Historical Provider, MD  tetrahydrozoline-zinc (VISINE-AC) 0.05-0.25 % ophthalmic solution Place 2 drops into both eyes 3 (three) times daily as needed. Dry/Red Eyes    Historical Provider, MD   BP 126/86 mmHg  Pulse 95  Temp(Src) 99.1 F (37.3 C) (Oral)  Resp 16  Ht 5\' 4"  (1.626 m)  Wt 69.854 kg  BMI 26.42 kg/m2  SpO2 100% Physical Exam  Constitutional: He appears well-developed and well-nourished.  HENT:  Head: Normocephalic and atraumatic.  Mouth/Throat: Oropharynx is clear and moist.  Eyes: Conjunctivae are normal.  Neck: Normal range of motion.  Cardiovascular: Normal rate, regular rhythm, normal heart sounds and intact distal pulses.   Pulmonary/Chest: Effort normal and breath sounds normal. He has no wheezes.  Abdominal: Soft. Bowel sounds are normal. He exhibits no distension. There is no tenderness. There is no guarding.  Musculoskeletal: Normal range of motion.  Neurological: He is alert.  Skin: Skin is warm and dry.  Psychiatric: He has a normal mood and affect.  Nursing note and vitals reviewed.   ED Course  Procedures (including critical care time) Labs Review Labs Reviewed  CBC WITH DIFFERENTIAL/PLATELET - Abnormal; Notable for the following:    Platelets 132 (*)    All other components within normal limits  COMPREHENSIVE METABOLIC PANEL - Abnormal; Notable for the following:    Sodium 133 (*)    Glucose, Bld 280 (*)    BUN 24 (*)    Creatinine, Ser 1.32 (*)    AST 13 (*)    ALT 13 (*)    All other components within normal limits  URINALYSIS, ROUTINE W REFLEX MICROSCOPIC (NOT AT [facility]) - Abnormal; Notable for the following:    Specific Gravity, Urine >1.030 (*)    Glucose, UA 250 (*)    Bilirubin Urine MODERATE (*)    Ketones, ur 15 (*)    Protein, ur 30 (*)    All other components within normal limits  URINE MICROSCOPIC-ADD ON - Abnormal; Notable for the following:    Bacteria, UA RARE (*)    Casts GRANULAR CAST (*)    All other components  within normal limits  CBG MONITORING, ED - Abnormal; Notable for the following:    Glucose-Capillary 262 (*)    All other components within normal limits    Imaging Review No results found. I have personally reviewed and evaluated these images and lab results as part of my medical decision-making.   EKG Interpretation None      MDM   Final diagnoses:  Diarrhea of presumed infectious origin  Dehydration    Patients  labs reviewed.  Results were also discussed with patient. Discussed elevated creatinine and advised recheck lab work in 2 days.  Pt to call pcp for lab appt for this.  Suspect this elevation is from dehydration only, and discussed need for increased oral intake at this time.  He understands need  for recheck to ensure his creatinine is returning to baseline.  He was given IV fluids 1.5 L while here.  He tolerated PO intake while here. No diarrhea while here.  He had complaint of generalize body aches, given morphine 2 mg with IV for this complaint. Plan f/u as indicated.    The patient appears reasonably screened and/or stabilized for discharge and I doubt any other medical condition or other [facility] requiring further screening, evaluation, or treatment in the ED at this time prior to discharge.      Burgess Amor, PA-C 02/21/15 1212  Geoffery Lyons, MD 02/21/15 [phone]

## 2015-02-20 NOTE — Discharge Instructions (Signed)

## 2015-02-20 NOTE — ED Notes (Signed)
Pt comes in stating his has been running fevers the past several day with n/v/d. Pt verbalizes he has had a cough as well; non-productive. Pt has had pain in his right flank area; with hx of transplant.

## 2015-02-20 NOTE — ED Notes (Signed)
PA at bedside.

## 2015-02-20 NOTE — ED Notes (Signed)
Went in to get patients CBG - Patient eating french fries.

## 2015-07-11 ENCOUNTER — Encounter (HOSPITAL_COMMUNITY): Payer: Self-pay | Admitting: Emergency Medicine

## 2015-07-11 ENCOUNTER — Emergency Department (HOSPITAL_COMMUNITY): Payer: Medicaid Other

## 2015-07-11 ENCOUNTER — Emergency Department (HOSPITAL_COMMUNITY)
Admission: EM | Admit: 2015-07-11 | Discharge: 2015-07-11 | Disposition: A | Discharge status: Home / Self Care | Payer: Medicaid Other | Attending: Emergency Medicine | Admitting: Emergency Medicine

## 2015-07-11 DIAGNOSIS — R0789 Other chest pain: Secondary | ICD-10-CM

## 2015-07-11 DIAGNOSIS — Z79899 Other long term (current) drug therapy: Secondary | ICD-10-CM | POA: Diagnosis not present

## 2015-07-11 DIAGNOSIS — I10 Essential (primary) hypertension: Secondary | ICD-10-CM | POA: Diagnosis not present

## 2015-07-11 DIAGNOSIS — Z7982 Long term (current) use of aspirin: Secondary | ICD-10-CM | POA: Diagnosis not present

## 2015-07-11 DIAGNOSIS — E119 Type 2 diabetes mellitus without complications: Secondary | ICD-10-CM | POA: Diagnosis not present

## 2015-07-11 DIAGNOSIS — Z7984 Long term (current) use of oral hypoglycemic drugs: Secondary | ICD-10-CM | POA: Insufficient documentation

## 2015-07-11 LAB — CBC
HEMATOCRIT: 41.6 % (ref 39.0–52.0)
Hemoglobin: 13.7 g/dL (ref 13.0–17.0)
MCH: 26.7 pg (ref 26.0–34.0)
MCHC: 32.9 g/dL (ref 30.0–36.0)
MCV: 81.1 fL (ref 78.0–100.0)
Platelets: 229 10*3/uL (ref 150–400)
RBC: 5.13 MIL/uL (ref 4.22–5.81)
RDW: 13.1 % (ref 11.5–15.5)
WBC: 7.2 10*3/uL (ref 4.0–10.5)

## 2015-07-11 LAB — COMPREHENSIVE METABOLIC PANEL
ALBUMIN: 3.8 g/dL (ref 3.5–5.0)
ALT: 10 U/L — ABNORMAL LOW (ref 17–63)
ANION GAP: 8 (ref 5–15)
AST: 11 U/L — ABNORMAL LOW (ref 15–41)
Alkaline Phosphatase: 94 U/L (ref 38–126)
BUN: 19 mg/dL (ref 6–20)
CHLORIDE: 100 mmol/L — AB (ref 101–111)
CO2: 27 mmol/L (ref 22–32)
Calcium: 10 mg/dL (ref 8.9–10.3)
Creatinine, Ser: 1.21 mg/dL (ref 0.61–1.24)
GFR calc non Af Amer: >60 mL/min (ref >60)
GLUCOSE: 334 mg/dL — AB (ref 65–99)
POTASSIUM: 4.4 mmol/L (ref 3.5–5.1)
SODIUM: 135 mmol/L (ref 135–145)
Total Bilirubin: 0.6 mg/dL (ref 0.3–1.2)
Total Protein: 8.1 g/dL (ref 6.5–8.1)

## 2015-07-11 LAB — D-DIMER, QUANTITATIVE (NOT AT ARMC): D DIMER QUANT: 0.45 ug{FEU}/mL (ref 0.00–0.50)

## 2015-07-11 LAB — TROPONIN I: Troponin I: <0.03 ng/mL (ref <0.031)

## 2015-07-11 MED ORDER — HYDROCODONE-ACETAMINOPHEN 5-325 MG PO TABS
1.0000 | ORAL_TABLET | Freq: Once | ORAL | Status: AC
  Administered 2015-07-11: 1 via ORAL
  Filled 2015-07-11: qty 1

## 2015-07-11 NOTE — ED Notes (Signed)
Pt states he has been having chest pain since Monday worsening with inspiration and movement.  Denies other symptoms.

## 2015-07-11 NOTE — ED Notes (Signed)
Patient complaining of "burning in my stomach after I got that pain medicine." Gave patient peanut butter and crackers and drink as requested and approved by Dr Manus Gunningancour.

## 2015-07-11 NOTE — Discharge Instructions (Signed)
Nonspecific Chest Pain  There is no evidence of heart attack or blood clot in the lung. No pneumonia. Follow-up with your doctor. Watch out for a development of a possible shingles rash. Return to the ED if you develop new or worsening symptoms. Chest pain can be caused by many different conditions. There is always a chance that your pain could be related to something serious, such as a heart attack or a blood clot in your lungs. Chest pain can also be caused by conditions that are not life-threatening. If you have chest pain, it is very important to follow up with your health care provider. CAUSES  Chest pain can be caused by:  Heartburn.  Pneumonia or bronchitis.  Anxiety or stress.  Inflammation around your heart (pericarditis) or lung (pleuritis or pleurisy).  A blood clot in your lung.  A collapsed lung (pneumothorax). It can develop suddenly on its own (spontaneous pneumothorax) or from trauma to the chest.  Shingles infection (varicella-zoster virus).  Heart attack.  Damage to the bones, muscles, and cartilage that make up your chest wall. This can include:  Bruised bones due to injury.  Strained muscles or cartilage due to frequent or repeated coughing or overwork.  Fracture to one or more ribs.  Sore cartilage due to inflammation (costochondritis). RISK FACTORS  Risk factors for chest pain may include:  Activities that increase your risk for trauma or injury to your chest.  Respiratory infections or conditions that cause frequent coughing.  Medical conditions or overeating that can cause heartburn.  Heart disease or family history of heart disease.  Conditions or health behaviors that increase your risk of developing a blood clot.  Having had chicken pox (varicella zoster). SIGNS AND SYMPTOMS Chest pain can feel like:  Burning or tingling on the surface of your chest or deep in your chest.  Crushing, pressure, aching, or squeezing pain.  Dull or sharp pain  that is worse when you move, cough, or take a deep breath.  Pain that is also felt in your back, neck, shoulder, or arm, or pain that spreads to any of these areas. Your chest pain may come and go, or it may stay constant. DIAGNOSIS Lab tests or other studies may be needed to find the cause of your pain. Your health care provider may have you take a test called an ambulatory ECG (electrocardiogram). An ECG records your heartbeat patterns at the time the test is performed. You may also have other tests, such as:  Transthoracic echocardiogram (TTE). During echocardiography, sound waves are used to create a picture of all of the heart structures and to look at how blood flows through your heart.  Transesophageal echocardiogram (TEE).This is a more advanced imaging test that obtains images from inside your body. It allows your health care provider to see your heart in finer detail.  Cardiac monitoring. This allows your health care provider to monitor your heart rate and rhythm in real time.  Holter monitor. This is a portable device that records your heartbeat and can help to diagnose abnormal heartbeats. It allows your health care provider to track your heart activity for several days, if needed.  Stress tests. These can be done through exercise or by taking medicine that makes your heart beat more quickly.  Blood tests.  Imaging tests. TREATMENT  Your treatment depends on what is causing your chest pain. Treatment may include:  Medicines. These may include:  Acid blockers for heartburn.  Anti-inflammatory medicine.  Pain medicine for inflammatory  conditions.  Antibiotic medicine, if an infection is present.  Medicines to dissolve blood clots.  Medicines to treat coronary artery disease.  Supportive care for conditions that do not require medicines. This may include:  Resting.  Applying heat or cold packs to injured areas.  Limiting activities until pain decreases. HOME CARE  INSTRUCTIONS  If you were prescribed an antibiotic medicine, finish it all even if you start to feel better.  Avoid any activities that bring on chest pain.  Do not use any tobacco products, including cigarettes, chewing tobacco, or electronic cigarettes. If you need help quitting, ask your health care provider.  Do not drink alcohol.  Take medicines only as directed by your health care provider.  Keep all follow-up visits as directed by your health care provider. This is important. This includes any further testing if your chest pain does not go away.  If heartburn is the cause for your chest pain, you may be told to keep your head raised (elevated) while sleeping. This reduces the chance that acid will go from your stomach into your esophagus.  Make lifestyle changes as directed by your health care provider. These may include:  Getting regular exercise. Ask your health care provider to suggest some activities that are safe for you.  Eating a heart-healthy diet. A registered dietitian can help you to learn healthy eating options.  Maintaining a healthy weight.  Managing diabetes, if necessary.  Reducing stress. SEEK MEDICAL CARE IF:  Your chest pain does not go away after treatment.  You have a rash with blisters on your chest.  You have a fever. SEEK IMMEDIATE MEDICAL CARE IF:   Your chest pain is worse.  You have an increasing cough, or you cough up blood.  You have severe abdominal pain.  You have severe weakness.  You faint.  You have chills.  You have sudden, unexplained chest discomfort.  You have sudden, unexplained discomfort in your arms, back, neck, or jaw.  You have shortness of breath at any time.  You suddenly start to sweat, or your skin gets clammy.  You feel nauseous or you vomit.  You suddenly feel light-headed or dizzy.  Your heart begins to beat quickly, or it feels like it is skipping beats. These symptoms may represent a serious  problem that is an emergency. Do not wait to see if the symptoms will go away. Get medical help right away. Call your local emergency services (911 in the [place] Do not drive yourself to the hospital.   This information is not intended to replace advice given to you by your health care provider. Make sure you discuss any questions you have with your health care provider.   Document Released: 11/12/2004 Document Revised: 02/23/2014 Document Reviewed: 09/08/2013 Elsevier Interactive Patient Education [employer].

## 2015-07-11 NOTE — ED Provider Notes (Signed)
CSN: [id number]     Arrival date & time 07/11/15  1433 History   First MD Initiated Contact with Patient 07/11/15 1543     Chief Complaint  Patient presents with  . Chest Pain     (Consider location/radiation/quality/duration/timing/severity/associated sxs/prior Treatment) HPI Comments: Patient presents with a three-day history of left-sided chest pain. This started upon waking from sleep 3 days ago and has been constant. It eased off some yesterday but came back while he was driving and thinks that he jerked the steering wheel and made the pain worse. The pain has been constant since yesterday. It is worse with twisting his torso inspiration. Denies any shortness of breath, cough or fever. Denies any cardiac history. History of kidney transplant 2007, diabetes, hypertension. He is on chronic immunosuppressants including Prograf, CellCept and prednisone. No rash on his chest. He has never had this pain before. Pain does not radiate to his arm, neck or back. No abdominal pain. Pain is not exertional.  he applied a hot pack which gave him some relief.  The history is provided by the patient.    Past Medical History  Diagnosis Date  . Diabetes mellitus   . Hypertension   . Polycystic kidney disease   . DDD (degenerative disc disease), lumbar    Past Surgical History  Procedure Laterality Date  . Nephrectomy transplanted organ     History reviewed. No pertinent family history. Social History  Substance Use Topics  . Smoking status: Never Smoker   . Smokeless tobacco: None  . Alcohol Use: No     Comment: rarely    Review of Systems  Constitutional: Negative for fever, activity change and appetite change.  HENT: Negative for congestion and rhinorrhea.   Eyes: Negative for visual disturbance.  Respiratory: Positive for chest tightness. Negative for cough and shortness of breath.   Cardiovascular: Positive for chest pain.  Gastrointestinal: Negative for nausea, vomiting and abdominal  pain.  Genitourinary: Negative for dysuria, urgency and hematuria.  Musculoskeletal: Negative for myalgias, back pain, arthralgias and gait problem.  Skin: Negative for rash.  Neurological: Negative for dizziness, weakness and light-headedness.  A complete 10 system review of systems was obtained and all systems are negative except as noted in the HPI and PMH.      Allergies  Review of patient's allergies indicates no known allergies.  Home Medications   Prior to Admission medications   Medication Sig Start Date End Date Taking? Authorizing Provider  amLODipine (NORVASC) 10 MG tablet Take 10 mg by mouth daily.    Historical Provider, MD  aspirin EC 81 MG tablet Take 81 mg by mouth daily.    Historical Provider, MD  enalapril (VASOTEC) 5 MG tablet Take 5 mg by mouth daily.    Historical Provider, MD  glimepiride (AMARYL) 2 MG tablet Take 2 mg by mouth daily with breakfast.    Historical Provider, MD  metFORMIN (GLUCOPHAGE-XR) 500 MG 24 hr tablet Take 1,000 mg by mouth 2 (two) times daily. 08/06/14   Historical Provider, MD  mycophenolate (CELLCEPT) 250 MG capsule Take 1,000 mg by mouth 2 (two) times daily.    Historical Provider, MD  omeprazole (PRILOSEC) 20 MG capsule Take 20 mg by mouth daily as needed (for acid reflux).     Historical Provider, MD  oxyCODONE-acetaminophen (PERCOCET) 10-325 MG per tablet Take 1 tablet by mouth every 4 (four) hours as needed for pain.  08/15/14   Historical Provider, MD  predniSONE (DELTASONE) 5 MG tablet Take 5  mg by mouth daily.    Historical Provider, MD  simvastatin (ZOCOR) 20 MG tablet Take 20 mg by mouth daily.    Historical Provider, MD  tacrolimus (PROGRAF) 1 MG capsule Take 2 mg by mouth 2 (two) times daily.    Historical Provider, MD  tetrahydrozoline-zinc (VISINE-AC) 0.05-0.25 % ophthalmic solution Place 2 drops into both eyes 3 (three) times daily as needed. Dry/Red Eyes    Historical Provider, MD   BP 132/95 mmHg  Pulse 73  Temp(Src) 98.8  F (37.1 C) (Oral)  Resp 16  Ht 5\' 4"  (1.626 m)  Wt 155 lb (70.308 kg)  BMI 26.59 kg/m2  SpO2 100% Physical Exam  Constitutional: He is oriented to person, place, and time. He appears well-developed and well-nourished. No distress.  HENT:  Head: Normocephalic and atraumatic.  Mouth/Throat: Oropharynx is clear and moist. No oropharyngeal exudate.  Eyes: Conjunctivae and EOM are normal. Pupils are equal, round, and reactive to light.  Neck: Normal range of motion. Neck supple.  No meningismus.  Cardiovascular: Normal rate, regular rhythm, normal heart sounds and intact distal pulses.   No murmur heard. Pulmonary/Chest: Effort normal and breath sounds normal. No respiratory distress. He exhibits no tenderness.  Patient denies chest wall tenderness but jumps when his chest is palpated. There is no visible rash. Pain is not worse with left arm movement.  Abdominal: Soft. There is no tenderness. There is no rebound and no guarding.  Musculoskeletal: Normal range of motion. He exhibits no edema or tenderness.  Neurological: He is alert and oriented to person, place, and time. No cranial nerve deficit. He exhibits normal muscle tone. Coordination normal.  No ataxia on finger to nose bilaterally. No pronator drift. 5/5 strength throughout. CN 2-12 intact.Equal grip strength. Sensation intact.   Skin: Skin is warm.  Psychiatric: He has a normal mood and affect. His behavior is normal.  Nursing note and vitals reviewed.   ED Course  Procedures (including critical care time) Labs Review Labs Reviewed  COMPREHENSIVE METABOLIC PANEL - Abnormal; Notable for the following:    Chloride 100 (*)    Glucose, Bld 334 (*)    AST 11 (*)    ALT 10 (*)    All other components within normal limits  CBC  TROPONIN I  D-DIMER, QUANTITATIVE (NOT AT [facility])    Imaging Review Dg Chest 2 View  07/11/2015  CLINICAL DATA:  And chest pain for 3 days, initial encounter EXAM: CHEST  2 VIEW COMPARISON:   11/14/2014 FINDINGS: The heart size and mediastinal contours are within normal limits. Both lungs are clear. The visualized skeletal structures are unremarkable. IMPRESSION: No active cardiopulmonary disease. Electronically Signed   By: Alcide Clever M.D.   On: 07/11/2015 15:22   I have personally reviewed and evaluated these images and lab results as part of my medical decision-making.   EKG Interpretation   Date/Time:  Thursday Jul 11 2015 14:43:10 EDT Ventricular Rate:  91 PR Interval:  156 QRS Duration: 92 QT Interval:  352 QTC Calculation: 432 R Axis:   -2 Text Interpretation:  Normal sinus rhythm Normal ECG No significant change  was found Confirmed by Manus Gunning  MD, Jurline Folger [phone]) on 07/11/2015 3:43:43  PM      MDM   Final diagnoses:  Atypical chest pain   Ongoing left-sided chest pain for the past 3 days. It is pleuritic and worse with movement of his torso. It is not exertional. Low suspicion for ACS. EKG is normal sinus rhythm  without acute ST changes. We'll obtain x-ray and d-dimer.  X-rays negative. Troponin is negative with ongoing pain for the past 3 days. D-dimer is negative. Low suspicion for PE.  Consider possible zoster with rash is not visible. Low suspicion for ACS his pain has been ongoing for 3 days.  Troponin negative x2.  Followup with PCP, return precautions discussed.    Glynn OctaveStephen Nashae Maudlin, MD 07/12/15 [phone]

## 2016-08-19 ENCOUNTER — Emergency Department (HOSPITAL_COMMUNITY)
Admission: EM | Admit: 2016-08-19 | Discharge: 2016-08-19 | Disposition: A | Discharge status: Home / Self Care | Payer: Self-pay | Attending: Emergency Medicine | Admitting: Emergency Medicine

## 2016-08-19 ENCOUNTER — Encounter (HOSPITAL_COMMUNITY): Payer: Self-pay | Admitting: *Deleted

## 2016-08-19 ENCOUNTER — Emergency Department (HOSPITAL_COMMUNITY): Payer: Self-pay

## 2016-08-19 DIAGNOSIS — R0602 Shortness of breath: Secondary | ICD-10-CM | POA: Insufficient documentation

## 2016-08-19 DIAGNOSIS — I10 Essential (primary) hypertension: Secondary | ICD-10-CM | POA: Insufficient documentation

## 2016-08-19 DIAGNOSIS — Z940 Kidney transplant status: Secondary | ICD-10-CM | POA: Insufficient documentation

## 2016-08-19 DIAGNOSIS — Z7982 Long term (current) use of aspirin: Secondary | ICD-10-CM | POA: Insufficient documentation

## 2016-08-19 DIAGNOSIS — Z79899 Other long term (current) drug therapy: Secondary | ICD-10-CM | POA: Insufficient documentation

## 2016-08-19 DIAGNOSIS — E119 Type 2 diabetes mellitus without complications: Secondary | ICD-10-CM | POA: Insufficient documentation

## 2016-08-19 DIAGNOSIS — Z7984 Long term (current) use of oral hypoglycemic drugs: Secondary | ICD-10-CM | POA: Insufficient documentation

## 2016-08-19 LAB — CBC WITH DIFFERENTIAL/PLATELET
BASOS PCT: 0 %
Basophils Absolute: 0 10*3/uL (ref 0.0–0.1)
EOS ABS: 0.1 10*3/uL (ref 0.0–0.7)
Eosinophils Relative: 1 %
HEMATOCRIT: 39 % (ref 39.0–52.0)
HEMOGLOBIN: 12.5 g/dL — AB (ref 13.0–17.0)
LYMPHS ABS: 1.9 10*3/uL (ref 0.7–4.0)
Lymphocytes Relative: 26 %
MCH: 25.8 pg — ABNORMAL LOW (ref 26.0–34.0)
MCHC: 32.1 g/dL (ref 30.0–36.0)
MCV: 80.6 fL (ref 78.0–100.0)
MONOS PCT: 6 %
Monocytes Absolute: 0.4 10*3/uL (ref 0.1–1.0)
NEUTROS ABS: 4.9 10*3/uL (ref 1.7–7.7)
NEUTROS PCT: 67 %
Platelets: 223 10*3/uL (ref 150–400)
RBC: 4.84 MIL/uL (ref 4.22–5.81)
RDW: 14 % (ref 11.5–15.5)
WBC: 7.3 10*3/uL (ref 4.0–10.5)

## 2016-08-19 LAB — COMPREHENSIVE METABOLIC PANEL
ALBUMIN: 3.4 g/dL — AB (ref 3.5–5.0)
ALK PHOS: 87 U/L (ref 38–126)
ALT: 12 U/L — AB (ref 17–63)
ANION GAP: 8 (ref 5–15)
AST: 14 U/L — ABNORMAL LOW (ref 15–41)
BILIRUBIN TOTAL: 0.9 mg/dL (ref 0.3–1.2)
BUN: 18 mg/dL (ref 6–20)
CALCIUM: 9.3 mg/dL (ref 8.9–10.3)
CO2: 26 mmol/L (ref 22–32)
CREATININE: 1.14 mg/dL (ref 0.61–1.24)
Chloride: 102 mmol/L (ref 101–111)
GFR calc Af Amer: >60 mL/min (ref >60)
GFR calc non Af Amer: >60 mL/min (ref >60)
GLUCOSE: 364 mg/dL — AB (ref 65–99)
Potassium: 4.2 mmol/L (ref 3.5–5.1)
SODIUM: 136 mmol/L (ref 135–145)
TOTAL PROTEIN: 7.5 g/dL (ref 6.5–8.1)

## 2016-08-19 LAB — D-DIMER, QUANTITATIVE (NOT AT ARMC): D DIMER QUANT: 0.41 ug{FEU}/mL (ref 0.00–0.50)

## 2016-08-19 LAB — BRAIN NATRIURETIC PEPTIDE: B Natriuretic Peptide: 15 pg/mL (ref 0.0–100.0)

## 2016-08-19 LAB — TROPONIN I: Troponin I: <0.03 ng/mL (ref <0.03)

## 2016-08-19 NOTE — ED Triage Notes (Signed)
Short of breath, feels like he is anemic

## 2016-08-19 NOTE — Discharge Instructions (Signed)
Follow-up with your family doctor next week for recheck. Return here if any problems before then

## 2016-08-19 NOTE — ED Provider Notes (Signed)
[facility]-EMERGENCY DEPT Provider Note   CSN: [id number] Arrival date & time: 08/19/16  1705     History   Chief Complaint Chief Complaint  Patient presents with  . Shortness of Breath    HPI [name] is a 51 y.o. male.  Patient states that he had some shortness of breath today but it has resolved.   The history is provided by the patient. No language interpreter was used.  Shortness of Breath  This is a new problem. The problem occurs rarely.The current episode started less than 1 hour ago. The problem has been resolved. Pertinent negatives include no fever, no headaches, no cough, no chest pain, no abdominal pain and no rash. Risk factors: Diabetes.    Past Medical History:  Diagnosis Date  . DDD (degenerative disc disease), lumbar   . Diabetes mellitus   . Hypertension   . Polycystic kidney disease     Patient Active Problem List   Diagnosis Date Noted  . Fever 11/15/2014  . SIRS (systemic inflammatory response syndrome) (HCC) 11/15/2014  . Renal transplant recipient 2007 at [facility] 11/15/2014  . Diabetes mellitus (HCC)   . Hypertension   . Polycystic kidney disease   . History of renal transplant   . Sepsis (HCC) 11/14/2014    Past Surgical History:  Procedure Laterality Date  . NEPHRECTOMY TRANSPLANTED ORGAN         Home Medications    Prior to Admission medications   Medication Sig Start Date End Date Taking? Authorizing Provider  amLODipine (NORVASC) 10 MG tablet Take 10 mg by mouth daily.   Yes [provider]  aspirin EC 81 MG tablet Take 81 mg by mouth daily.   Yes [provider]  enalapril (VASOTEC) 5 MG tablet Take 5 mg by mouth daily.   Yes [provider]  glimepiride (AMARYL) 2 MG tablet Take 2 mg by mouth daily with breakfast.   Yes [provider]  metFORMIN (GLUCOPHAGE-XR) 500 MG 24 hr tablet Take 1,000 mg by mouth 2 (two) times daily. 08/06/14  Yes [provider]  mycophenolate (CELLCEPT)  250 MG capsule Take 1,000 mg by mouth 2 (two) times daily.   Yes [provider]  omeprazole (PRILOSEC) 20 MG capsule Take 20 mg by mouth daily as needed (for acid reflux).    Yes [provider]  oxyCODONE-acetaminophen (PERCOCET) 10-325 MG per tablet Take 1 tablet by mouth every 4 (four) hours as needed for pain.  08/15/14  Yes [provider]  predniSONE (DELTASONE) 5 MG tablet Take 5 mg by mouth daily.   Yes [provider]  simvastatin (ZOCOR) 20 MG tablet Take 20 mg by mouth daily.   Yes [provider]  tacrolimus (PROGRAF) 1 MG capsule Take 2 mg by mouth 2 (two) times daily.   Yes [provider]  tetrahydrozoline-zinc (VISINE-AC) 0.05-0.25 % ophthalmic solution Place 2 drops into both eyes 3 (three) times daily as needed. Dry/Red Eyes   Yes [provider]    Family History No family history on file.  Social History Social History  Substance Use Topics  . Smoking status: Never Smoker  . Smokeless tobacco: Never Used  . Alcohol use No     Comment: rarely     Allergies   Patient has no known allergies.   Review of Systems Review of Systems  Constitutional: Negative for appetite change, fatigue and fever.  HENT: Negative for congestion, ear discharge and sinus pressure.   Eyes: Negative for  discharge.  Respiratory: Positive for shortness of breath. Negative for cough.   Cardiovascular: Negative for chest pain.  Gastrointestinal: Negative for abdominal pain and diarrhea.  Genitourinary: Negative for frequency and hematuria.  Musculoskeletal: Negative for back pain.  Skin: Negative for rash.  Neurological: Negative for seizures and headaches.  Psychiatric/Behavioral: Negative for hallucinations.     Physical Exam Updated Vital Signs BP (!) 136/91 (BP Location: Right Arm)   Pulse 86   Temp 98.4 F (36.9 C) (Oral)   Resp 17   Ht 5\' 4"  (1.626 m)   Wt 70.8 kg (156 lb)   SpO2 98%   BMI 26.78 kg/m    Physical Exam  Constitutional: He is oriented to person, place, and time. He appears well-developed.  HENT:  Head: Normocephalic.  Eyes: Conjunctivae and EOM are normal. No scleral icterus.  Neck: Neck supple. No thyromegaly present.  Cardiovascular: Normal rate and regular rhythm.  Exam reveals no gallop and no friction rub.   No murmur heard. Pulmonary/Chest: No stridor. He has no wheezes. He has no rales. He exhibits no tenderness.  Abdominal: He exhibits no distension. There is no tenderness. There is no rebound.  Musculoskeletal: Normal range of motion. He exhibits no edema.  Lymphadenopathy:    He has no cervical adenopathy.  Neurological: He is oriented to person, place, and time. He exhibits normal muscle tone. Coordination normal.  Skin: No rash noted. No erythema.  Psychiatric: He has a normal mood and affect. His behavior is normal.     ED Treatments / Results  Labs (all labs ordered are listed, but only abnormal results are displayed) Labs Reviewed  CBC WITH DIFFERENTIAL/PLATELET - Abnormal; Notable for the following:       Result Value   Hemoglobin 12.5 (*)    MCH 25.8 (*)    All other components within normal limits  COMPREHENSIVE METABOLIC PANEL - Abnormal; Notable for the following:    Glucose, Bld 364 (*)    Albumin 3.4 (*)    AST 14 (*)    ALT 12 (*)    All other components within normal limits  BRAIN NATRIURETIC PEPTIDE  TROPONIN I  D-DIMER, QUANTITATIVE (NOT [facility])    EKG  EKG Interpretation  Date/Time:  Wednesday August 19 2016 18:37:08 EDT Ventricular Rate:  86 PR Interval:    QRS Duration: 95 QT Interval:  344 QTC Calculation: 412 R Axis:   42 Text Interpretation:  Sinus rhythm Confirmed by Bethann BerkshireZammit, Leshae Mcclay [phone]([id number]) on 08/19/2016 9:42:15 PM       Radiology Dg Chest 2 View  Result Date: 08/19/2016 CLINICAL DATA:  Dyspnea and weakness times several days. EXAM: CHEST  2 VIEW COMPARISON:  07/11/2015 FINDINGS: The heart size and mediastinal  contours are within normal limits. Slightly low lung volumes with slight crowding of lower lobe interstitial lung markings. No pneumonic consolidation or CHF. No effusion or pneumothorax. The visualized skeletal structures are unremarkable. IMPRESSION: No active cardiopulmonary disease. Electronically Signed   By: Tollie Ethavid  Kwon M.D.   On: 08/19/2016 19:11    Procedures Procedures (including critical care time)  Medications Ordered in ED Medications - No data to display   Initial Impression / Assessment and Plan / ED Course  I have reviewed the triage vital signs and the nursing notes.  Pertinent labs & imaging results that were available during my care of the patient were reviewed by me and considered in my medical decision making (see chart for details).     Labs unremarkable  including troponin and d-dimer EKG and chest x-ray. Patient shortness of breath has improved. He'll follow-up with his PCP next week for further evaluation  Final Clinical Impressions(s) / ED Diagnoses   Final diagnoses:  SOB (shortness of breath)    New Prescriptions New Prescriptions   No medications on file     Bethann Berkshire, MD 08/19/16 2157

## 2017-05-28 ENCOUNTER — Other Ambulatory Visit: Payer: Self-pay

## 2017-05-28 ENCOUNTER — Emergency Department (HOSPITAL_COMMUNITY)
Admission: EM | Admit: 2017-05-28 | Discharge: 2017-05-28 | Disposition: A | Discharge status: Home / Self Care | Payer: Self-pay | Attending: Emergency Medicine | Admitting: Emergency Medicine

## 2017-05-28 ENCOUNTER — Encounter (HOSPITAL_COMMUNITY): Payer: Self-pay | Admitting: Emergency Medicine

## 2017-05-28 DIAGNOSIS — Z79899 Other long term (current) drug therapy: Secondary | ICD-10-CM | POA: Insufficient documentation

## 2017-05-28 DIAGNOSIS — Z7984 Long term (current) use of oral hypoglycemic drugs: Secondary | ICD-10-CM | POA: Insufficient documentation

## 2017-05-28 DIAGNOSIS — Z7982 Long term (current) use of aspirin: Secondary | ICD-10-CM | POA: Insufficient documentation

## 2017-05-28 DIAGNOSIS — I10 Essential (primary) hypertension: Secondary | ICD-10-CM | POA: Insufficient documentation

## 2017-05-28 DIAGNOSIS — Z206 Contact with and (suspected) exposure to human immunodeficiency virus [HIV]: Secondary | ICD-10-CM | POA: Insufficient documentation

## 2017-05-28 DIAGNOSIS — E119 Type 2 diabetes mellitus without complications: Secondary | ICD-10-CM | POA: Insufficient documentation

## 2017-05-28 DIAGNOSIS — Z940 Kidney transplant status: Secondary | ICD-10-CM | POA: Insufficient documentation

## 2017-05-28 LAB — COMPREHENSIVE METABOLIC PANEL
ALT: 18 U/L (ref 17–63)
AST: 22 U/L (ref 15–41)
Albumin: 3.7 g/dL (ref 3.5–5.0)
Alkaline Phosphatase: 91 U/L (ref 38–126)
Anion gap: 13 (ref 5–15)
BUN: 17 mg/dL (ref 6–20)
CHLORIDE: 101 mmol/L (ref 101–111)
CO2: 21 mmol/L — AB (ref 22–32)
CREATININE: 0.98 mg/dL (ref 0.61–1.24)
Calcium: 9.5 mg/dL (ref 8.9–10.3)
Glucose, Bld: 353 mg/dL — ABNORMAL HIGH (ref 65–99)
POTASSIUM: 3.9 mmol/L (ref 3.5–5.1)
Sodium: 135 mmol/L (ref 135–145)
Total Bilirubin: 1.1 mg/dL (ref 0.3–1.2)
Total Protein: 7.5 g/dL (ref 6.5–8.1)

## 2017-05-28 LAB — RAPID HIV SCREEN (HIV 1/2 AB+AG)
HIV 1/2 Antibodies: NONREACTIVE
HIV-1 P24 Antigen - HIV24: NONREACTIVE

## 2017-05-28 MED ORDER — ELVITEG-COBIC-EMTRICIT-TENOFAF 150-150-200-10 MG PO TABS: 1.0000 | ORAL_TABLET | Freq: Every day | ORAL | 0 refills | Status: DC

## 2017-05-28 NOTE — ED Provider Notes (Signed)
[facility] EMERGENCY DEPARTMENT Provider Note   CSN: [id number] Arrival date & time: 05/28/17  0802     History   Chief Complaint Chief Complaint  Patient presents with  . Body Fluid Exposure    HPI [name] is a 52 y.o. male.  Patient is a 52 year old male who presents to the emergency department after exposure to an HIV-positive patient.  The patient states that he had administered an insulin dose.  He was putting the needle in the sharps container when he accidentally pricked his right index finger.  The patient states that the area bled shortly but he washed it patient denies any previous history of any HIV status, hepatitis C, hepatitis B, or other diseases.  The patient does have a history of kidney transplant and hypertension.  He states that he had a steroid-induced hyperglycemia episode.  He presents now for post exposure evaluation and management.     Past Medical History:  Diagnosis Date  . DDD (degenerative disc disease), lumbar   . Diabetes mellitus   . Hypertension   . Polycystic kidney disease     Patient Active Problem List   Diagnosis Date Noted  . Fever 11/15/2014  . SIRS (systemic inflammatory response syndrome) (HCC) 11/15/2014  . Renal transplant recipient 2007 at [facility] 11/15/2014  . Diabetes mellitus (HCC)   . Hypertension   . Polycystic kidney disease   . History of renal transplant   . Sepsis (HCC) 11/14/2014    Past Surgical History:  Procedure Laterality Date  . NEPHRECTOMY TRANSPLANTED ORGAN          Home Medications    Prior to Admission medications   Medication Sig Start Date End Date Taking? Authorizing Provider  amLODipine (NORVASC) 10 MG tablet Take 10 mg by mouth daily.    [provider]  aspirin EC 81 MG tablet Take 81 mg by mouth daily.    [provider]  elvitegravir-cobicistat-emtricitabine-tenofovir (GENVOYA) 150-150-200-10 MG TABS tablet Take 1 tablet by mouth daily with breakfast. 05/28/17    Ivery QualeBryant, Bryker Fletchall, PA-C  enalapril (VASOTEC) 5 MG tablet Take 5 mg by mouth daily.    [provider]  glimepiride (AMARYL) 2 MG tablet Take 2 mg by mouth daily with breakfast.    [provider]  metFORMIN (GLUCOPHAGE-XR) 500 MG 24 hr tablet Take 1,000 mg by mouth 2 (two) times daily. 08/06/14   [provider]  mycophenolate (CELLCEPT) 250 MG capsule Take 1,000 mg by mouth 2 (two) times daily.    [provider]  omeprazole (PRILOSEC) 20 MG capsule Take 20 mg by mouth daily as needed (for acid reflux).     [provider]  oxyCODONE-acetaminophen (PERCOCET) 10-325 MG per tablet Take 1 tablet by mouth every 4 (four) hours as needed for pain.  08/15/14   [provider]  predniSONE (DELTASONE) 5 MG tablet Take 5 mg by mouth daily.    [provider]  simvastatin (ZOCOR) 20 MG tablet Take 20 mg by mouth daily.    [provider]  tacrolimus (PROGRAF) 1 MG capsule Take 2 mg by mouth 2 (two) times daily.    [provider]  tetrahydrozoline-zinc (VISINE-AC) 0.05-0.25 % ophthalmic solution Place 2 drops into both eyes 3 (three) times daily as needed. Dry/Red Eyes    [provider]    Family History History reviewed. No pertinent family history.  Social History Social History   Tobacco Use  . Smoking status: Never Smoker  . Smokeless tobacco:  Never Used  Substance Use Topics  . Alcohol use: No    Comment: rarely  . Drug use: No     Allergies   Patient has no allergy information on record.   Review of Systems Review of Systems  Constitutional: Negative for activity change.       All ROS Neg except as noted in HPI  HENT: Negative for nosebleeds.   Eyes: Negative for photophobia and discharge.  Respiratory: Negative for cough, shortness of breath and wheezing.   Cardiovascular: Negative for chest pain and palpitations.  Gastrointestinal: Negative for abdominal pain and blood in stool.    Genitourinary: Negative for dysuria, frequency and hematuria.  Musculoskeletal: Negative for arthralgias, back pain and neck pain.  Skin: Negative.   Neurological: Negative for dizziness, seizures and speech difficulty.  Psychiatric/Behavioral: Negative for confusion and hallucinations.     Physical Exam Updated Vital Signs BP (!) 143/93   Pulse 79   Temp 97.9 F (36.6 C) (Oral)   Resp 16   Ht 5\' 4"  (1.626 m)   Wt 68 kg (150 lb)   SpO2 98%   BMI 25.75 kg/m   Physical Exam  Constitutional: He is oriented to person, place, and time. He appears well-developed and well-nourished.  Non-toxic appearance.  HENT:  Head: Normocephalic.  Right Ear: Tympanic membrane and external ear normal.  Left Ear: Tympanic membrane and external ear normal.  Eyes: Pupils are equal, round, and reactive to light. EOM and lids are normal.  Neck: Normal range of motion. Neck supple. Carotid bruit is not present.  Cardiovascular: Normal rate, regular rhythm, normal heart sounds, intact distal pulses and normal pulses.  Pulmonary/Chest: Breath sounds normal. No respiratory distress.  Abdominal: Soft. Bowel sounds are normal. There is no tenderness. There is no guarding.  Musculoskeletal: Normal range of motion.  Puncture wound to the right index finger.  No bleeding noted.  Lymphadenopathy:       Head (right side): No submandibular adenopathy present.       Head (left side): No submandibular adenopathy present.    He has no cervical adenopathy.  Neurological: He is alert and oriented to person, place, and time. He has normal strength. No cranial nerve deficit or sensory deficit.  Skin: Skin is warm and dry.  Psychiatric: He has a normal mood and affect. His speech is normal.  Nursing note and vitals reviewed.    ED Treatments / Results  Labs (all labs ordered are listed, but only abnormal results are displayed) Labs Reviewed  COMPREHENSIVE METABOLIC PANEL - Abnormal; Notable for the following  components:      Result Value   CO2 21 (*)    Glucose, Bld 353 (*)    All other components within normal limits  RAPID HIV SCREEN (HIV 1/2 AB+AG)  HEPATITIS C ANTIBODY  HEPATITIS B SURFACE ANTIGEN  RPR    EKG None  Radiology No results found.  Procedures Procedures (including critical care time)  Medications Ordered in ED Medications - No data to display   Initial Impression / Assessment and Plan / ED Course  I have reviewed the triage vital signs and the nursing notes.  Pertinent labs & imaging results that were available during my care of the patient were reviewed by me and considered in my medical decision making (see chart for details).       Final Clinical Impressions(s) / ED Diagnoses  Patient was treated according to exposure protocol.  The chemistries were obtained and found to be  negative.  In particular the rapid HIV test was negative. Patient is on medication to ensure he does not rechecked his kidney.  I have asked the patient to discuss the use of the prophylactic medications with his doctor to discuss all the pros and cons.  Consultation was obtained from the nurse manager.  Arrangements were made to assist the patient in getting his preliminary medications.  Patient will follow up with his primary physician.   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

## 2017-05-28 NOTE — Discharge Instructions (Addendum)
Please discuss your exposure, as well as the preliminary medications with your primary physician.  Your initial test have been negative.  Please return to the emergency department if any emergent changes in your condition, problems, or concerns.

## 2017-05-28 NOTE — Care Management (Signed)
CM consult for accessing HIV exposure medications. CM contacted [employer] Patient Assistance program at pt bedside. Information provided and pt approved for assistance program for 30-day supply free of charge using:   ID# [id number] BIN# [id number] GROUP# [id number] PCN: [id number]  CM contacted [facility] (pharmacy of choice). They have medication in stock. CM provided payor info. Pt will take printed Rx to pharmacy to have filled. [facility] pharm to provide first 5 days for pt to take with him.

## 2017-05-28 NOTE — ED Triage Notes (Signed)
Pt was throwing a used insulin needle in a sharps container and accidentally pricked his right index finger.  Pt states that the patient he was given insulin to has AIDS.

## 2017-05-29 LAB — RPR: RPR: NONREACTIVE

## 2017-05-29 LAB — HEPATITIS B SURFACE ANTIGEN: HEP B S AG: NEGATIVE

## 2017-05-29 LAB — HEPATITIS C ANTIBODY

## 2018-10-21 ENCOUNTER — Emergency Department (HOSPITAL_COMMUNITY)
Admission: EM | Admit: 2018-10-21 | Discharge: 2018-10-22 | Disposition: A | Discharge status: Home / Self Care | Payer: Self-pay | Attending: Emergency Medicine | Admitting: Emergency Medicine

## 2018-10-21 ENCOUNTER — Other Ambulatory Visit: Payer: Self-pay

## 2018-10-21 ENCOUNTER — Emergency Department (HOSPITAL_COMMUNITY): Payer: Self-pay

## 2018-10-21 ENCOUNTER — Encounter (HOSPITAL_COMMUNITY): Payer: Self-pay

## 2018-10-21 DIAGNOSIS — Z7982 Long term (current) use of aspirin: Secondary | ICD-10-CM | POA: Insufficient documentation

## 2018-10-21 DIAGNOSIS — I10 Essential (primary) hypertension: Secondary | ICD-10-CM | POA: Insufficient documentation

## 2018-10-21 DIAGNOSIS — R079 Chest pain, unspecified: Secondary | ICD-10-CM

## 2018-10-21 DIAGNOSIS — N179 Acute kidney failure, unspecified: Secondary | ICD-10-CM | POA: Insufficient documentation

## 2018-10-21 DIAGNOSIS — Z940 Kidney transplant status: Secondary | ICD-10-CM | POA: Insufficient documentation

## 2018-10-21 DIAGNOSIS — Z20828 Contact with and (suspected) exposure to other viral communicable diseases: Secondary | ICD-10-CM | POA: Insufficient documentation

## 2018-10-21 DIAGNOSIS — Z7984 Long term (current) use of oral hypoglycemic drugs: Secondary | ICD-10-CM | POA: Insufficient documentation

## 2018-10-21 DIAGNOSIS — Z79899 Other long term (current) drug therapy: Secondary | ICD-10-CM | POA: Insufficient documentation

## 2018-10-21 DIAGNOSIS — E119 Type 2 diabetes mellitus without complications: Secondary | ICD-10-CM | POA: Insufficient documentation

## 2018-10-21 DIAGNOSIS — R0789 Other chest pain: Secondary | ICD-10-CM | POA: Insufficient documentation

## 2018-10-21 LAB — CK: Total CK: 89 U/L (ref 49–397)

## 2018-10-21 LAB — CBC
HCT: 46 % (ref 39.0–52.0)
Hemoglobin: 14.1 g/dL (ref 13.0–17.0)
MCH: 26.5 pg (ref 26.0–34.0)
MCHC: 30.7 g/dL (ref 30.0–36.0)
MCV: 86.3 fL (ref 80.0–100.0)
Platelets: 143 10*3/uL — ABNORMAL LOW (ref 150–400)
RBC: 5.33 MIL/uL (ref 4.22–5.81)
RDW: 12.4 % (ref 11.5–15.5)
WBC: 4.3 10*3/uL (ref 4.0–10.5)
nRBC: 0 % (ref 0.0–0.2)

## 2018-10-21 LAB — BASIC METABOLIC PANEL
Anion gap: 9 (ref 5–15)
BUN: 22 mg/dL — ABNORMAL HIGH (ref 6–20)
CO2: 25 mmol/L (ref 22–32)
Calcium: 9.4 mg/dL (ref 8.9–10.3)
Chloride: 99 mmol/L (ref 98–111)
Creatinine, Ser: 1.55 mg/dL — ABNORMAL HIGH (ref 0.61–1.24)
GFR calc Af Amer: 59 mL/min — ABNORMAL LOW (ref >60)
GFR calc non Af Amer: 51 mL/min — ABNORMAL LOW (ref >60)
Glucose, Bld: 334 mg/dL — ABNORMAL HIGH (ref 70–99)
Potassium: 4 mmol/L (ref 3.5–5.1)
Sodium: 133 mmol/L — ABNORMAL LOW (ref 135–145)

## 2018-10-21 LAB — TROPONIN I (HIGH SENSITIVITY)
Troponin I (High Sensitivity): 6 ng/L (ref <18)
Troponin I (High Sensitivity): 5 ng/L (ref <18)

## 2018-10-21 MED ORDER — SODIUM CHLORIDE 0.9% FLUSH: 3.0000 mL | Freq: Once | INTRAVENOUS | Status: DC

## 2018-10-21 MED ORDER — SODIUM CHLORIDE 0.9 % IV BOLUS
1000.0000 mL | Freq: Once | INTRAVENOUS | Status: AC
  Administered 2018-10-21: 22:00:00 1000 mL via INTRAVENOUS

## 2018-10-21 NOTE — ED Provider Notes (Signed)
[facility] EMERGENCY DEPARTMENT Provider Note   CSN: [id number] Arrival date & time: 10/21/18  1948     History   Chief Complaint Chief Complaint  Patient presents with  . Chest Pain    HPI [name] is a 53 y.o. male.     HPI Patient presented with chest pain.  States he feels kind of weak all over.  States he moved a heat pump by himself which should have been done with another person.  States the next day he was having pain in his chest and legs and felt weak all over.  States he is actually felt hot and cold along the way to.  At times has tightness in the chest.  Does not come on with exertion.  No cough.  No swelling in his legs.  No dysuria.  He is post kidney transplant.  No dysuria. Past Medical History:  Diagnosis Date  . DDD (degenerative disc disease), lumbar   . Diabetes mellitus   . Hypertension   . Polycystic kidney disease     Patient Active Problem List   Diagnosis Date Noted  . Fever 11/15/2014  . SIRS (systemic inflammatory response syndrome) (HCC) 11/15/2014  . Renal transplant recipient 2007 at [facility] 11/15/2014  . Diabetes mellitus (HCC)   . Hypertension   . Polycystic kidney disease   . History of renal transplant   . Sepsis (HCC) 11/14/2014    Past Surgical History:  Procedure Laterality Date  . NEPHRECTOMY TRANSPLANTED ORGAN          Home Medications    Prior to Admission medications   Medication Sig Start Date End Date Taking? Authorizing Provider  amLODipine (NORVASC) 10 MG tablet Take 10 mg by mouth daily.   Yes [provider]  aspirin EC 81 MG tablet Take 81 mg by mouth daily.   Yes [provider]  elvitegravir-cobicistat-emtricitabine-tenofovir (GENVOYA) 150-150-200-10 MG TABS tablet Take 1 tablet by mouth daily with breakfast. 05/28/17  Yes Ivery Quale, PA-C  enalapril (VASOTEC) 5 MG tablet Take 5 mg by mouth daily.   Yes [provider]  glimepiride (AMARYL) 2 MG tablet Take 2 mg by mouth  daily with breakfast.   Yes [provider]  insulin regular (NOVOLIN R) 100 units/mL injection Inject 10 Units into the skin 3 (three) times daily with meals.   Yes [provider]  metFORMIN (GLUCOPHAGE-XR) 500 MG 24 hr tablet Take 1,000 mg by mouth 2 (two) times daily. 08/06/14  Yes [provider]  mycophenolate (CELLCEPT) 250 MG capsule Take 1,000 mg by mouth 2 (two) times daily.   Yes [provider]  predniSONE (DELTASONE) 5 MG tablet Take 5 mg by mouth daily.   Yes [provider]  simvastatin (ZOCOR) 20 MG tablet Take 20 mg by mouth daily.   Yes [provider]  tacrolimus (PROGRAF) 1 MG capsule Take 2 mg by mouth 2 (two) times daily.   Yes [provider]  tetrahydrozoline-zinc (VISINE-AC) 0.05-0.25 % ophthalmic solution Place 2 drops into both eyes 3 (three) times daily as needed. Dry/Red Eyes   Yes [provider]    Family History No family history on file.  Social History Social History   Tobacco Use  . Smoking status: Never Smoker  . Smokeless tobacco: Never Used  Substance Use Topics  . Alcohol use: No    Comment: rarely  . Drug use: No     Allergies   Patient has no known allergies.  Review of Systems Review of Systems  Constitutional: Positive for appetite change, chills and fatigue.  HENT: Negative for congestion.   Respiratory: Negative for shortness of breath.   Cardiovascular: Positive for chest pain.  Gastrointestinal: Negative for abdominal pain.  Genitourinary: Negative for flank pain.  Musculoskeletal: Positive for myalgias. Negative for back pain.  Skin: Negative for rash.  Neurological: Positive for weakness.  Hematological: Negative for adenopathy.  Psychiatric/Behavioral: Negative for confusion.     Physical Exam Updated Vital Signs BP (!) 154/94   Pulse 96   Temp 99.8 F (37.7 C) (Oral)   Resp (!) 26   Ht 5\' 4"  (1.626 m)   Wt 68 kg   SpO2 100%   BMI 25.75  kg/m   Physical Exam Vitals signs and nursing note reviewed.  HENT:     Head: Atraumatic.  Neck:     Musculoskeletal: Normal range of motion.  Cardiovascular:     Rate and Rhythm: Normal rate and regular rhythm.  Pulmonary:     Effort: Pulmonary effort is normal.  Chest:     Chest wall: No tenderness or crepitus.  Abdominal:     Palpations: Abdomen is soft.     Tenderness: There is no abdominal tenderness.  Musculoskeletal:     Right lower leg: No edema.     Left lower leg: He exhibits no tenderness. No edema.  Skin:    General: Skin is warm.     Capillary Refill: Capillary refill takes less than 2 seconds.  Neurological:     Mental Status: He is alert and oriented to person, place, and time.      ED Treatments / Results  Labs (all labs ordered are listed, but only abnormal results are displayed) Labs Reviewed  BASIC METABOLIC PANEL - Abnormal; Notable for the following components:      Result Value   Sodium 133 (*)    Glucose, Bld 334 (*)    BUN 22 (*)    Creatinine, Ser 1.55 (*)    GFR calc non Af Amer 51 (*)    GFR calc Af Amer 59 (*)    All other components within normal limits  CBC - Abnormal; Notable for the following components:   Platelets 143 (*)    All other components within normal limits  CULTURE, BLOOD (ROUTINE X 2)  URINE CULTURE  CULTURE, BLOOD (ROUTINE X 2)  NOVEL CORONAVIRUS, NAA (HOSP ORDER, SEND-OUT TO REF LAB; TAT 18-24 HRS)  URINALYSIS, ROUTINE W REFLEX MICROSCOPIC  CK  TROPONIN I (HIGH SENSITIVITY)  TROPONIN I (HIGH SENSITIVITY)    EKG EKG Interpretation  Date/Time:  Friday October 21 2018 20:03:09 EDT Ventricular Rate:  103 PR Interval:  158 QRS Duration: 96 QT Interval:  338 QTC Calculation: 442 R Axis:   -18 Text Interpretation:  Sinus tachycardia Possible Left atrial enlargement Borderline ECG Confirmed by Davonna Belling [phone]) on 10/21/2018 10:47:13 PM   Radiology Dg Chest Portable 1 View  Result Date: 10/21/2018  CLINICAL DATA:  Chest pain EXAM: PORTABLE CHEST 1 VIEW COMPARISON:  08/19/2016 FINDINGS: The heart size and mediastinal contours are within normal limits. Both lungs are clear. The visualized skeletal structures are unremarkable. IMPRESSION: No active disease. Electronically Signed   By: Donavan Foil M.D.   On: 10/21/2018 21:21    Procedures Procedures (including critical care time)  Medications Ordered in ED Medications  sodium chloride flush (NS) 0.9 % injection 3 mL (3 mLs Intravenous Not Given 10/21/18 2055)  sodium chloride 0.9 %  bolus 1,000 mL (1,000 mLs Intravenous New Bag/Given 10/21/18 2203)     Initial Impression / Assessment and Plan / ED Course  I have reviewed the triage vital signs and the nursing notes.  Pertinent labs & imaging results that were available during my care of the patient were reviewed by me and considered in my medical decision making (see chart for details).        Patient presents after chest pain.  Had also myalgias and some chills and feeling hot and cold.  Creatinine mildly increased.  Afebrile here but is on immunosuppression for previous renal transplant.  Creatinine mildly increased.  X-ray reassuring as is EKG and troponin.  Doubt this is cardiac cause.  However with immunosuppression and chills blood cultures have been added.  Will get urinalysis and with increased kidney function after exertion will give fluid hydration and check a CK.  Care will be turned over to oncoming provider.  Final Clinical Impressions(s) / ED Diagnoses   Final diagnoses:  Nonspecific chest pain  Renal transplant recipient    ED Discharge Orders    None       Benjiman CorePickering, Stashia Sia, MD 10/21/18 2252

## 2018-10-21 NOTE — ED Notes (Signed)
ED Provider at bedside. 

## 2018-10-21 NOTE — ED Triage Notes (Signed)
Pt presents to ED with complaints of chest pain started Monday night, weakness, and bilateral leg pain. Pt states he has been moving things and noted chest pain after. Pt denies fever. Pt states he isn't having chest pain right now but used [facility] prior to arrival.

## 2018-10-21 NOTE — Discharge Instructions (Addendum)
Follow-up with your doctor for your worsening kidney function

## 2018-10-22 LAB — URINALYSIS, ROUTINE W REFLEX MICROSCOPIC
Bacteria, UA: NONE SEEN
Bilirubin Urine: NEGATIVE
Glucose, UA: >=500 mg/dL — AB
Ketones, ur: 5 mg/dL — AB
Leukocytes,Ua: NEGATIVE
Nitrite: NEGATIVE
Protein, ur: 100 mg/dL — AB
Specific Gravity, Urine: 1.023 (ref 1.005–1.030)
pH: 5 (ref 5.0–8.0)

## 2018-10-23 LAB — NOVEL CORONAVIRUS, NAA (HOSP ORDER, SEND-OUT TO REF LAB; TAT 18-24 HRS): SARS-CoV-2, NAA: NOT DETECTED

## 2018-10-23 LAB — URINE CULTURE: Culture: NO GROWTH

## 2018-10-26 LAB — CULTURE, BLOOD (ROUTINE X 2)
Culture: NO GROWTH
Culture: NO GROWTH
Special Requests: ADEQUATE
Special Requests: ADEQUATE

## 2018-10-27 ENCOUNTER — Telehealth: Payer: Self-pay | Admitting: Family Medicine

## 2018-10-27 NOTE — Telephone Encounter (Signed)
Negative COVID results given. Patient results "NOT Detected." Caller expressed understanding. ° °

## 2020-02-06 ENCOUNTER — Encounter (HOSPITAL_COMMUNITY): Payer: Self-pay

## 2020-02-06 ENCOUNTER — Emergency Department (HOSPITAL_COMMUNITY)
Admission: EM | Admit: 2020-02-06 | Discharge: 2020-02-06 | Disposition: A | Discharge status: Home / Self Care | Payer: Self-pay | Attending: Emergency Medicine | Admitting: Emergency Medicine

## 2020-02-06 ENCOUNTER — Emergency Department (HOSPITAL_COMMUNITY): Payer: Self-pay

## 2020-02-06 ENCOUNTER — Other Ambulatory Visit: Payer: Self-pay

## 2020-02-06 DIAGNOSIS — Z7984 Long term (current) use of oral hypoglycemic drugs: Secondary | ICD-10-CM | POA: Insufficient documentation

## 2020-02-06 DIAGNOSIS — Y93F2 Activity, caregiving, lifting: Secondary | ICD-10-CM | POA: Insufficient documentation

## 2020-02-06 DIAGNOSIS — Z7982 Long term (current) use of aspirin: Secondary | ICD-10-CM | POA: Insufficient documentation

## 2020-02-06 DIAGNOSIS — M25511 Pain in right shoulder: Secondary | ICD-10-CM | POA: Insufficient documentation

## 2020-02-06 DIAGNOSIS — X500XXA Overexertion from strenuous movement or load, initial encounter: Secondary | ICD-10-CM | POA: Insufficient documentation

## 2020-02-06 DIAGNOSIS — I10 Essential (primary) hypertension: Secondary | ICD-10-CM | POA: Insufficient documentation

## 2020-02-06 DIAGNOSIS — Z79899 Other long term (current) drug therapy: Secondary | ICD-10-CM | POA: Insufficient documentation

## 2020-02-06 DIAGNOSIS — E119 Type 2 diabetes mellitus without complications: Secondary | ICD-10-CM | POA: Insufficient documentation

## 2020-02-06 DIAGNOSIS — M25512 Pain in left shoulder: Secondary | ICD-10-CM | POA: Insufficient documentation

## 2020-02-06 DIAGNOSIS — Z794 Long term (current) use of insulin: Secondary | ICD-10-CM | POA: Insufficient documentation

## 2020-02-06 DIAGNOSIS — Y990 Civilian activity done for income or pay: Secondary | ICD-10-CM | POA: Insufficient documentation

## 2020-02-06 DIAGNOSIS — G8929 Other chronic pain: Secondary | ICD-10-CM | POA: Insufficient documentation

## 2020-02-06 MED ORDER — ACETAMINOPHEN 325 MG PO TABS
650.0000 mg | ORAL_TABLET | Freq: Once | ORAL | Status: AC
  Administered 2020-02-06: 650 mg via ORAL
  Filled 2020-02-06: qty 2

## 2020-02-06 NOTE — Discharge Instructions (Signed)
Your x-rays were negative today, as we discussed this is most likely shoulder impingement syndrome.  If you notice that your shoulder becomes hot, you have fevers, you have swelling in your arm, you have any discoloration in your arm the need to come back to the emergency department.  I want you to take Tylenol as directed on the bottle for the next couple of days, use ice and heat and follow the attached instructions.  I want you to follow-up with your PCP, please make an appointment in the next couple of days.  If you have any new or worsening concerning symptoms please come back to the emergency department.

## 2020-02-06 NOTE — ED Provider Notes (Signed)
[facility] EMERGENCY DEPARTMENT Provider Note   CSN: [id number] Arrival date & time: 02/06/20  0935     History Chief Complaint  Patient presents with  . Shoulder Pain    [name] is a 54 y.o. male with past medical history of diabetes, hypertension cholecystic kidney disease with history of renal transplant that presents emergency department today for bilateral shoulder pain.  Patient states that he has had bilateral shoulder pain for the past couple of months, came in today to get evaluated.  Has not been progressing, has been the same over the past couple months.  He thinks that this could be related to his booster shot, states that he did get his booster 3 weeks ago.  States that shoulder pain started a couple months ago.  Patient has not tried any anti-inflammatories or pain medication for this.  States that it hurts worse when he lifts up his arms.  No prior injury to this area.  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  States that he is constantly using his arms.  Hurts worse at night.  Denies any fevers, chills, night sweats, weight changes, chest pain, shortness of breath, abdominal pain.  States that he has been using icy hot for this which has not been helping.  Denies any swelling, warmth, numbness or tingling or redness.  No other complaints. Pain does not radiate down into his hands.  HPI     Past Medical History:  Diagnosis Date  . DDD (degenerative disc disease), lumbar   . Diabetes mellitus   . Hypertension   . Polycystic kidney disease     Patient Active Problem List   Diagnosis Date Noted  . Fever 11/15/2014  . SIRS (systemic inflammatory response syndrome) (HCC) 11/15/2014  . Renal transplant recipient 2007 at [facility] 11/15/2014  . Diabetes mellitus (HCC)   . Hypertension   . Polycystic kidney disease   . History of renal transplant   . Sepsis (HCC) 11/14/2014     Past Surgical History:  Procedure Laterality Date  . NEPHRECTOMY TRANSPLANTED ORGAN         No family history on file.  Social History   Tobacco Use  . Smoking status: Never Smoker  . Smokeless tobacco: Never Used  Substance Use Topics  . Alcohol use: No    Comment: rarely  . Drug use: No    Home Medications Prior to Admission medications   Medication Sig Start Date End Date Taking? Authorizing Provider  amLODipine (NORVASC) 10 MG tablet Take 10 mg by mouth daily.    [provider]  aspirin EC 81 MG tablet Take 81 mg by mouth daily.    [provider]  elvitegravir-cobicistat-emtricitabine-tenofovir (GENVOYA) 150-150-200-10 MG TABS tablet Take 1 tablet by mouth daily with breakfast. 05/28/17   Ivery Quale, PA-C  enalapril (VASOTEC) 5 MG tablet Take 5 mg by mouth daily.    [provider]  glimepiride (AMARYL) 2 MG tablet Take 2 mg by mouth daily with breakfast.    [provider]  insulin regular (NOVOLIN R) 100 units/mL injection Inject 10 Units into the skin 3 (three) times daily with meals.    [provider]  metFORMIN (GLUCOPHAGE-XR) 500 MG 24 hr tablet Take 1,000 mg by mouth 2 (two) times daily. 08/06/14   [provider]  mycophenolate (CELLCEPT) 250 MG capsule Take 1,000 mg by mouth 2 (two) times daily.  [provider]  predniSONE (DELTASONE) 5 MG tablet Take 5 mg by mouth daily.    [provider]  simvastatin (ZOCOR) 20 MG tablet Take 20 mg by mouth daily.    [provider]  tacrolimus (PROGRAF) 1 MG capsule Take 2 mg by mouth 2 (two) times daily.    [provider]  tetrahydrozoline-zinc (VISINE-AC) 0.05-0.25 % ophthalmic solution Place 2 drops into both eyes 3 (three) times daily as needed. Dry/Red Eyes    [provider]    Allergies    Patient has no known allergies.  Review of Systems   Review of Systems  Constitutional: Negative for diaphoresis,  fatigue and fever.  Eyes: Negative for visual disturbance.  Respiratory: Negative for shortness of breath.   Cardiovascular: Negative for chest pain.  Gastrointestinal: Negative for nausea and vomiting.  Musculoskeletal: Positive for arthralgias. Negative for back pain and myalgias.  Skin: Negative for color change, pallor, rash and wound.  Neurological: Negative for syncope, weakness, light-headedness, numbness and headaches.  Psychiatric/Behavioral: Negative for behavioral problems and confusion.    Physical Exam Updated Vital Signs BP (!) 165/98 (BP Location: Left Arm)   Pulse 80   Temp 98.9 F (37.2 C) (Oral)   Resp 16   Ht 5\' 4"  (1.626 m)   Wt 67.1 kg   SpO2 100%   BMI 25.40 kg/m   Physical Exam Constitutional:      General: He is not in acute distress.    Appearance: Normal appearance. He is not ill-appearing, toxic-appearing or diaphoretic.  Cardiovascular:     Rate and Rhythm: Normal rate and regular rhythm.     Pulses: Normal pulses.  Pulmonary:     Effort: Pulmonary effort is normal.     Breath sounds: Normal breath sounds.  Musculoskeletal:        General: Normal range of motion.       Arms:     Comments: Patient with upper extremity tenderness, to shoulder joint and upper forearm.  No localized tenderness.  Ecchymosis and erythema not noted.  No edema.  No warmth.  Patient was able to range shoulder passively and actively in all directions.  Negative liftoff test.  Patient with pain when he lifts arm over 90 degrees bilaterally.  Positive empty can.  No objective numbness.  Radial pulses bilaterally 2+.  Cap refill less than 2 seconds.  Skin:    General: Skin is warm and dry.     Capillary Refill: Capillary refill takes less than 2 seconds.  Neurological:     General: No focal deficit present.     Mental Status: He is alert and oriented to person, place, and time.  Psychiatric:        Mood and Affect: Mood normal.        Behavior: Behavior normal.         Thought Content: Thought content normal.     ED Results / Procedures / Treatments   Labs (all labs ordered are listed, but only abnormal results are displayed) Labs Reviewed - No data to display  EKG None  Radiology DG Shoulder Right  Result Date: 02/06/2020 CLINICAL DATA:  Shoulder pain EXAM: RIGHT SHOULDER - 2+ VIEW COMPARISON:  None. FINDINGS: No fracture or dislocation is seen. Very mild degenerative changes of the glenohumeral joint. Visualized soft tissues are within normal limits. Visualized right lung is clear. IMPRESSION: Negative. Electronically Signed   By: 02/08/2020 M.D.   On: 02/06/2020 11:44   DG Shoulder  Left  Result Date: 02/06/2020 CLINICAL DATA:  Shoulder pain EXAM: LEFT SHOULDER - 2+ VIEW COMPARISON:  None. FINDINGS: No fracture or dislocation is seen. The joint spaces are preserved. Visualized soft tissues are within normal limits. Visualized left lung is clear. IMPRESSION: Negative. Electronically Signed   By: Charline Bills M.D.   On: 02/06/2020 11:44    Procedures Procedures (including critical care time)  Medications Ordered in ED Medications  acetaminophen (TYLENOL) tablet 650 mg (650 mg Oral Given 02/06/20 1037)    ED Course  I have reviewed the triage vital signs and the nursing notes.  Pertinent labs & imaging results that were available during my care of the patient were reviewed by me and considered in my medical decision making (see chart for details).    MDM Rules/Calculators/A&P                         [name] is a 54 y.o. male with past medical history of diabetes, hypertension cholecystic kidney disease with history of renal transplant that presents emergency department today for bilateral shoulder pain.Very low probability of upper extremity DVT.  No pitting edema, patient is distally neurovascularly intact, no true localized pain. No concerns for septic joint.  With physical exam, patient most likely has impingement  syndrome versus rotator cuff tendinitis.  No concerns for ACS, patient with bilateral shoulder pain, appears musculoskeletal in nature. Pain improved with tylenol.  Patient asking for plain films at this time, plain films negative.  Patient be discharged this time, he will follow-up with PCP.  Symptomatic treatment discussed, most likely impingement syndrome, patient agrees.  Work note provided.  Doubt need for further emergent work up at this time. I explained the diagnosis and have given explicit precautions to return to the ER including for any other new or worsening symptoms. The patient understands and accepts the medical plan as it's been dictated and I have answered their questions. Discharge instructions concerning home care and prescriptions have been given. The patient is STABLE and is discharged to home in good condition.   Final Clinical Impression(s) / ED Diagnoses Final diagnoses:  Chronic pain of both shoulders    Rx / DC Orders ED Discharge Orders    None       Farrel Gordon, PA-C 02/06/20 1157    Pollyann Savoy, MD 02/06/20 1446

## 2020-02-06 NOTE — ED Triage Notes (Signed)
Shoulder pain x 2 weeks, painful when he lifts arms and states he feels popping in his shoulders. He feels this may be related to his COVID booster 3 weeks ago. No OTC meds taken at home

## 2022-06-22 DIAGNOSIS — N186 End stage renal disease: Secondary | ICD-10-CM | POA: Diagnosis not present

## 2022-06-22 DIAGNOSIS — Z940 Kidney transplant status: Secondary | ICD-10-CM | POA: Diagnosis not present

## 2022-06-22 DIAGNOSIS — I10 Essential (primary) hypertension: Secondary | ICD-10-CM | POA: Diagnosis not present

## 2022-06-23 DIAGNOSIS — N186 End stage renal disease: Secondary | ICD-10-CM | POA: Diagnosis not present

## 2022-06-23 DIAGNOSIS — I10 Essential (primary) hypertension: Secondary | ICD-10-CM | POA: Diagnosis not present

## 2022-06-23 DIAGNOSIS — Z940 Kidney transplant status: Secondary | ICD-10-CM | POA: Diagnosis not present

## 2022-07-22 DIAGNOSIS — Z940 Kidney transplant status: Secondary | ICD-10-CM | POA: Diagnosis not present

## 2022-07-22 DIAGNOSIS — N186 End stage renal disease: Secondary | ICD-10-CM | POA: Diagnosis not present

## 2022-07-22 DIAGNOSIS — I10 Essential (primary) hypertension: Secondary | ICD-10-CM | POA: Diagnosis not present

## 2022-09-11 DIAGNOSIS — Z940 Kidney transplant status: Secondary | ICD-10-CM | POA: Diagnosis not present

## 2022-09-11 DIAGNOSIS — M545 Low back pain, unspecified: Secondary | ICD-10-CM | POA: Diagnosis not present

## 2022-09-11 DIAGNOSIS — Z79891 Long term (current) use of opiate analgesic: Secondary | ICD-10-CM | POA: Diagnosis not present

## 2022-09-11 DIAGNOSIS — E1165 Type 2 diabetes mellitus with hyperglycemia: Secondary | ICD-10-CM | POA: Diagnosis not present

## 2022-10-07 ENCOUNTER — Ambulatory Visit: Payer: Self-pay | Admitting: "Endocrinology

## 2022-11-03 DIAGNOSIS — Z940 Kidney transplant status: Secondary | ICD-10-CM | POA: Diagnosis not present

## 2022-11-03 DIAGNOSIS — I10 Essential (primary) hypertension: Secondary | ICD-10-CM | POA: Diagnosis not present

## 2022-11-03 DIAGNOSIS — N186 End stage renal disease: Secondary | ICD-10-CM | POA: Diagnosis not present

## 2022-11-03 DIAGNOSIS — D631 Anemia in chronic kidney disease: Secondary | ICD-10-CM | POA: Diagnosis not present

## 2023-02-02 ENCOUNTER — Encounter: Payer: Self-pay | Admitting: "Endocrinology

## 2023-02-02 ENCOUNTER — Ambulatory Visit (INDEPENDENT_AMBULATORY_CARE_PROVIDER_SITE_OTHER): Payer: BC Managed Care – PPO | Admitting: "Endocrinology

## 2023-02-02 VITALS — BP 150/84 | HR 100 | Ht 64.0 in | Wt 148.2 lb

## 2023-02-02 DIAGNOSIS — E1122 Type 2 diabetes mellitus with diabetic chronic kidney disease: Secondary | ICD-10-CM | POA: Diagnosis not present

## 2023-02-02 DIAGNOSIS — I10 Essential (primary) hypertension: Secondary | ICD-10-CM | POA: Diagnosis not present

## 2023-02-02 DIAGNOSIS — E782 Mixed hyperlipidemia: Secondary | ICD-10-CM | POA: Diagnosis not present

## 2023-02-02 DIAGNOSIS — N1832 Chronic kidney disease, stage 3b: Secondary | ICD-10-CM

## 2023-02-02 DIAGNOSIS — Z7984 Long term (current) use of oral hypoglycemic drugs: Secondary | ICD-10-CM | POA: Insufficient documentation

## 2023-02-02 LAB — POCT GLYCOSYLATED HEMOGLOBIN (HGB A1C): HbA1c, POC (controlled diabetic range): 8.5 % — AB (ref 0.0–7.0)

## 2023-02-02 MED ORDER — FREESTYLE LIBRE 3 PLUS SENSOR MISC
2 refills | Status: DC
Start: 2023-02-02 — End: 2023-07-01

## 2023-02-02 MED ORDER — GLIMEPIRIDE 2 MG PO TABS: 2.0000 mg | ORAL_TABLET | Freq: Every day | ORAL | 1 refills | Status: DC

## 2023-02-02 MED ORDER — FREESTYLE LIBRE 3 READER DEVI: 1.0000 | Freq: Once | 0 refills | Status: DC | PRN

## 2023-02-02 MED ORDER — TRUE METRIX BLOOD GLUCOSE TEST VI STRP: ORAL_STRIP | 2 refills | Status: DC

## 2023-02-02 NOTE — Progress Notes (Signed)
Endocrinology Consult Note       02/02/2023, 3:53 PM   Subjective:    Patient ID: [name], male    DOB: [birth year].  [name] is being seen in consultation for management of currently uncontrolled symptomatic diabetes requested by  Pcp, No.   Past Medical History:  Diagnosis Date   DDD (degenerative disc disease), lumbar    Diabetes mellitus    Hypertension    Polycystic kidney disease     Past Surgical History:  Procedure Laterality Date   NEPHRECTOMY TRANSPLANTED ORGAN      Social History   Socioeconomic History   Marital status: Single    Spouse name: Not on file   Number of children: Not on file   Years of education: Not on file   Highest education level: Not on file  Occupational History   Not on file  Tobacco Use   Smoking status: Never   Smokeless tobacco: Never  Substance and Sexual Activity   Alcohol use: No    Comment: rarely   Drug use: No   Sexual activity: Not on file  Other Topics Concern   Not on file  Social History Narrative   Not on file   Social Drivers of Health   Financial Resource Strain: Not on file  Food Insecurity: Not on file  Transportation Needs: Not on file  Physical Activity: Not on file  Stress: Not on file  Social Connections: Not on file    Family History  Problem Relation Age of Onset   Diabetes Mother    Kidney disease Mother    Stroke Mother    Kidney disease Father    Hypertension Father    Stroke Father    Heart disease Father     Outpatient Encounter Medications as of 02/02/2023  Medication Sig   Continuous Glucose Receiver (FREESTYLE LIBRE 3 READER) DEVI 1 Piece by Does not apply route once as needed for up to 1 dose.   Continuous Glucose Sensor (FREESTYLE LIBRE 3 PLUS SENSOR) MISC Change sensor every 15 days.   glucose blood (TRUE METRIX BLOOD GLUCOSE TEST) test strip Use as instructed   amLODipine (NORVASC) 10 MG  tablet Take 10 mg by mouth daily.   aspirin EC 81 MG tablet Take 81 mg by mouth daily.   carvedilol (COREG) 6.25 MG tablet Take 6.25 mg by mouth 2 (two) times daily.   elvitegravir-cobicistat-emtricitabine-tenofovir (GENVOYA) 150-150-200-10 MG TABS tablet Take 1 tablet by mouth daily with breakfast. (Patient not taking: Reported on 02/02/2023)   enalapril (VASOTEC) 5 MG tablet Take 5 mg by mouth daily.   glimepiride (AMARYL) 2 MG tablet Take 1 tablet (2 mg total) by mouth daily with breakfast.   insulin regular (NOVOLIN R) 100 units/mL injection Inject 10 Units into the skin 3 (three) times daily with meals. (Patient not taking: Reported on 02/02/2023)   JARDIANCE 10 MG TABS tablet Take 10 mg by mouth daily.   mycophenolate (CELLCEPT) 250 MG capsule Take 1,000 mg by mouth 2 (two) times daily.   oxyCODONE (OXY IR/ROXICODONE) 5 MG immediate release tablet Take by mouth.   predniSONE (DELTASONE) 5 MG  tablet Take 5 mg by mouth daily.   simvastatin (ZOCOR) 20 MG tablet Take 20 mg by mouth daily. (Patient not taking: Reported on 02/02/2023)   tacrolimus (PROGRAF) 1 MG capsule Take 2 mg by mouth 2 (two) times daily.   tetrahydrozoline-zinc (VISINE-AC) 0.05-0.25 % ophthalmic solution Place 2 drops into both eyes 3 (three) times daily as needed. Dry/Red Eyes   [DISCONTINUED] glimepiride (AMARYL) 2 MG tablet Take 2 mg by mouth daily with breakfast.   [DISCONTINUED] metFORMIN (GLUCOPHAGE-XR) 500 MG 24 hr tablet Take 1,000 mg by mouth 2 (two) times daily.   No facility-administered encounter medications on file as of 02/02/2023.    ALLERGIES: No Known Allergies  VACCINATION STATUS:  There is no immunization history on file for this patient.  Diabetes He presents for his initial diabetic visit. Diabetes type: Renal transplant related diabetes. His disease course has been fluctuating. Hypoglycemia symptoms include headaches. Pertinent negatives for hypoglycemia include no confusion, pallor or seizures.  Associated symptoms include polydipsia and polyuria. Pertinent negatives for diabetes include no chest pain, no fatigue, no polyphagia and no weakness. Symptoms are worsening. Diabetic complications include nephropathy. 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  He is currently on Jardiance 10 mg p.o. daily, and glimepiride 4 mg p.o. daily, metformin 1000 mg p.o. twice daily.  He is transplanted kidney is also failing with GFR of 33.  Patient was offered insulin treatment at 1 point, however he is currently not taking any insulin.) Risk factors for coronary artery disease include dyslipidemia, family history, male sex and sedentary lifestyle. He is following a generally unhealthy diet. When asked about meal planning, he reported none. He rarely participates in exercise. (Patient brought her blood new meter with no readings in it.  He is point-of-care A1c was 8.5%.  He reports some symptoms of hypoglycemia, did not report documented hypoglycemia.) An ACE inhibitor/angiotensin II receptor blocker is being taken.  Hypertension This is a chronic problem. The current episode started more than 1 year ago. The problem is uncontrolled. Associated symptoms include headaches. Pertinent negatives include no chest pain, neck pain, palpitations or shortness of breath. Risk factors for coronary artery disease include diabetes mellitus, male gender and sedentary lifestyle. Past treatments include ACE inhibitors. Hypertensive end-organ damage includes kidney disease. Identifiable causes of hypertension include chronic renal disease.     Review of Systems  Constitutional:  Negative for chills, fatigue, fever and unexpected weight change.  HENT:  Negative for dental problem, mouth sores and trouble swallowing.   Eyes:  Negative for visual disturbance.  Respiratory:  Negative for cough, choking, chest tightness, shortness of breath and wheezing.    Cardiovascular:  Negative for chest pain, palpitations and leg swelling.  Gastrointestinal:  Negative for abdominal distention, abdominal pain, constipation, diarrhea, nausea and vomiting.  Endocrine: Positive for polydipsia and polyuria. Negative for polyphagia.  Genitourinary:  Negative for dysuria, flank pain, hematuria and urgency.  Musculoskeletal:  Negative for back pain, gait problem, myalgias and neck pain.  Skin:  Negative for pallor, rash and wound.  Neurological:  Positive for headaches. Negative for seizures, syncope, weakness and numbness.  Psychiatric/Behavioral:  Negative for confusion and dysphoric mood.     Objective:       02/02/2023    2:15 PM 02/06/2020    9:53 AM 10/22/2018    1:44 AM  Vitals with BMI  Height 5\' 4"  5\' 4"    Weight 148 lbs 3 oz 148  lbs   BMI 25.43 25.39   Systolic 150 165 657  Diastolic 84 98 98  Pulse 100 80 94    BP (!) 150/84   Pulse 100   Ht 5\' 4"  (1.626 m)   Wt 148 lb 3.2 oz (67.2 kg)   BMI 25.44 kg/m   Wt Readings from Last 3 Encounters:  02/02/23 148 lb 3.2 oz (67.2 kg)  02/06/20 148 lb (67.1 kg)  10/21/18 150 lb (68 kg)     Physical Exam Constitutional:      General: He is not in acute distress.    Appearance: He is well-developed.  HENT:     Head: Normocephalic and atraumatic.  Neck:     Thyroid: No thyromegaly.     Trachea: No tracheal deviation.  Cardiovascular:     Rate and Rhythm: Normal rate.     Pulses:          Dorsalis pedis pulses are 1+ on the right side and 1+ on the left side.       Posterior tibial pulses are 1+ on the right side and 1+ on the left side.     Heart sounds: Normal heart sounds, S1 normal and S2 normal. No murmur heard.    No gallop.  Pulmonary:     Effort: No respiratory distress.     Breath sounds: Normal breath sounds. No wheezing.  Abdominal:     General: Bowel sounds are normal. There is no distension.     Palpations: Abdomen is soft.     Tenderness: There is no abdominal  tenderness. There is no guarding.  Musculoskeletal:     Right shoulder: No swelling or deformity.     Cervical back: Normal range of motion and neck supple.  Skin:    General: Skin is warm and dry.     Findings: No rash.     Nails: There is no clubbing.  Neurological:     Mental Status: He is alert and oriented to person, place, and time.     Cranial Nerves: No cranial nerve deficit.     Sensory: No sensory deficit.     Gait: Gait normal.     Deep Tendon Reflexes: Reflexes are normal and symmetric.  Psychiatric:        Speech: Speech normal.        Behavior: Behavior normal. Behavior is cooperative.        Thought Content: Thought content normal.        Judgment: Judgment normal.      CMP ( most recent) CMP     Component Value Date/Time   NA 133 (L) 10/21/2018 2048   K 4.0 10/21/2018 2048   CL 99 10/21/2018 2048   CO2 25 10/21/2018 2048   GLUCOSE 334 (H) 10/21/2018 2048   BUN 22 (H) 10/21/2018 2048   CREATININE 1.55 (H) 10/21/2018 2048   CALCIUM 9.4 10/21/2018 2048   PROT 7.5 05/28/2017 0853   ALBUMIN 3.7 05/28/2017 0853   AST 22 05/28/2017 0853   ALT 18 05/28/2017 0853   ALKPHOS 91 05/28/2017 0853   BILITOT 1.1 05/28/2017 0853   GFRNONAA 51 (L) 10/21/2018 2048     Diabetic Labs (most recent): Lab Results  Component Value Date   HGBA1C 8.5 (A) 02/02/2023   HGBA1C 9.1 (H) 11/14/2014     Assessment & Plan:   1. Type 2 diabetes mellitus with other specified complication, unspecified whether long term insulin use (HCC) (Primary)  - Severo Brian has currently  uncontrolled symptomatic type 2 DM since  57 years of age,  with most recent A1c of 8.5 %. Recent labs reviewed. 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  He reports that he was diagnosed with diabetes right after his transplant.  He is currently on Jardiance 10 mg p.o. daily, and glimepiride 4 mg p.o. daily, metformin 1000 mg p.o. twice  daily.  He is transplanted kidney is also failing with GFR of 33.  Patient was offered insulin treatment at 1 point, however he is currently not taking any insulin.  - I had a long discussion with him about the possible risk factors and  the pathology behind diabetes and its complications. -his diabetes is complicated by CKD and he remains at a high risk for more acute and chronic complications which include CAD, CVA, CKD, retinopathy, and neuropathy. These are all discussed in detail with him.  - I discussed all available options of managing his diabetes including de-escalation of medications. I have counseled him on Food as Medicine by adopting a Whole Food , Plant Predominant  ( WFPP) nutrition as recommended by [employer] of Lifestyle Medicine. Patient is encouraged to switch to  unprocessed or minimally processed  complex starch, adequate protein intake (mainly plant source), minimal liquid fat, plenty of fruits, and vegetables. -  he is advised to stick to a routine mealtimes to eat 3 complete meals a day and snack only when necessary (to snack only to correct hypoglycemia BG <70 day time or <100 at night).   - he acknowledges that there is a room for improvement in his food and drink choices. - Further Specific Suggestion is made for him to avoid simple carbohydrates  from his diet including Cakes, Sweet Desserts, Ice Cream, Soda (diet and regular), Sweet Tea, Candies, Chips, Cookies, Store Bought Juices, Alcohol ,  Artificial Sweeteners,  Coffee Creamer, and "Sugar-free" Products. This will help patient to have more stable blood glucose profile and potentially avoid unintended weight gain.   - he will be scheduled with Norm Salt, RDN, CDE for individualized diabetes education.  - I have approached him with the following individualized plan to manage  his diabetes and patient agrees:   -In light of his presentation with diminished GFR of 33, he is advised to discontinue metformin at  this time.  He would benefit from low-dose Jardiance currently 10 mg p.o. daily at breakfast.  He is advised to lower glimepiride to 2 mg p.o. daily at breakfast as well.  He is approached to monitor blood glucose 4 times a day and return in 2 to 3 weeks for reevaluation.  If he presents with significant hyperglycemia, he will be considered for basal insulin initiation.  This patient will greatly benefit from a CGM.  I discussed and prescribed the freestyle libre device for him. - he is encouraged to call clinic for blood glucose levels less than 70 or above 200 mg /dl.  - he will be considered for incretin therapy as appropriate next visit.  - Specific targets for  A1c;  LDL, HDL,  and Triglycerides were discussed with the patient.  2) Blood Pressure /Hypertension:  his blood pressure is uncontrolled to target.  It is noted that he is on enalapril 5 mg p.o. daily, amlodipine 10 mg p.o. daily, Coreg 6.25 mg p.o. twice daily.  I did not adjust his blood pressure medications, defer to nephrology.  He is advised to be consistent  on his current blood pressure medications.  3) Lipids/Hyperlipidemia: He does not have recent lipid panel to review.  He is advised to continue simvastatin 20 mg p.o. nightly.    Side effects and precautions discussed with him.  4)  Weight/Diet:  Body mass index is 25.44 kg/m.  -   he is not a candidate for major weight loss. I discussed with him the fact that loss of 5 - 10% of his  current body weight will have the most impact on his diabetes management.  The above detailed  ACLM recommendations for nutrition, exercise, sleep, social life, avoidance of risky substances, the need for restorative sleep  information is also detailed on discharge instructions.  5) Chronic Care/Health Maintenance:  -he  is on ACEI/ARB and Statin medications and  is encouraged to initiate and continue to follow up with Ophthalmology, Dentist,  Podiatrist at least yearly or according to  recommendations, and advised to   stay away from smoking. I have recommended yearly flu vaccine and pneumonia vaccine at least every 5 years; moderate intensity exercise for up to 150 minutes weekly; and  sleep for 7- 9 hours a day.  - he is  advised to maintain close follow up with Pcp, No for primary care needs, as well as his other providers for optimal and coordinated care.   Thank you for involving me in the care of this pleasant patient.  I spent  61  minutes in the care of the patient today including review of labs from CMP, Lipids, Thyroid Function, Hematology (current and previous including abstractions from other facilities); face-to-face time discussing  his blood glucose readings/logs, discussing hypoglycemia and hyperglycemia episodes and symptoms, medications doses, his options of short and long term treatment based on the latest standards of care / guidelines;  discussion about incorporating lifestyle medicine;  and documenting the encounter. Risk reduction counseling performed per USPSTF guidelines to reduce  cardiovascular risk factors.      Please refer to Patient Instructions for Blood Glucose Monitoring and Insulin/Medications Dosing Guide"  in media tab for additional information. Please  also refer to " Patient Self Inventory" in the Media  tab for reviewed elements of pertinent patient history.  Kem Kays participated in the discussions, expressed understanding, and voiced agreement with the above plans.  All questions were answered to his satisfaction. he is encouraged to contact clinic should he have any questions or concerns prior to his return visit.   Follow up plan: - Return in about 3 weeks (around 02/23/2023) for F/U with Meter/CGM Megan Salon Only - no Labs.  Marquis Lunch, MD [facility] [address] [place], [place] [address] Phone: [phone]  Fax: [phone]    02/02/2023, 3:53 PM  This note was partially  dictated with voice recognition software. Similar sounding words can be transcribed inadequately or may not  be corrected upon review.

## 2023-02-02 NOTE — Patient Instructions (Signed)
                                     Advice for Weight Management  -For most of us the best way to lose weight is by diet management. Generally speaking, diet management means consuming less calories intentionally which over time brings about progressive weight loss.  This can be achieved more effectively by avoiding ultra processed carbohydrates, processed meats, unhealthy fats.    It is critically important to know your numbers: how much calorie you are consuming and how much calorie you need. More importantly, our carbohydrates sources should be unprocessed naturally occurring  complex starch food items.  It is always important to balance nutrition also by  appropriate intake of proteins (mainly plant-based), healthy fats/oils, plenty of fruits and vegetables.   -The American College of Lifestyle Medicine (ACL M) recommends nutrition derived mostly from Whole Food, Plant Predominant Sources example an apple instead of applesauce or apple pie. Eat Plenty of vegetables, Mushrooms, fruits, Legumes, Whole Grains, Nuts, seeds in lieu of processed meats, processed snacks/pastries red meat, poultry, eggs.  Use only water or unsweetened tea for hydration.  The College also recommends the need to stay away from risky substances including alcohol, smoking; obtaining 7-9 hours of restorative sleep, at least 150 minutes of moderate intensity exercise weekly, importance of healthy social connections, and being mindful of stress and seek help when it is overwhelming.    -Sticking to a routine mealtime to eat 3 meals a day and avoiding unnecessary snacks is shown to have a big role in weight control. Under normal circumstances, the only time we burn stored energy is when we are hungry, so allow  some hunger to take place- hunger means no food between appropriate meal times, only water.  It is not advisable to starve.   -It is better to avoid simple carbohydrates including:  Cakes, Sweet Desserts, Ice Cream, Soda (diet and regular), Sweet Tea, Candies, Chips, Cookies, Store Bought Juices, Alcohol in Excess of  1-2 drinks a day, Lemonade,  Artificial Sweeteners, Doughnuts, Coffee Creamers, "Sugar-free" Products, etc, etc.  This is not a complete list.....    -Consulting with certified diabetes educators is proven to provide you with the most accurate and current information on diet.  Also, you may be  interested in discussing diet options/exchanges , we can schedule a visit with [name], RDN, CDE for individualized nutrition education.  -Exercise: If you are able: 30 -60 minutes a day ,4 days a week, or 150 minutes of moderate intensity exercise weekly.    The longer the better if tolerated.  Combine stretch, strength, and aerobic activities.  If you were told in the past that you have high risk for cardiovascular diseases, or if you are currently symptomatic, you may seek evaluation by your heart doctor prior to initiating moderate to intense exercise programs.                                  Additional Care Considerations for Diabetes/Prediabetes   -Diabetes  is a chronic disease.  The most important care consideration is regular follow-up with your diabetes care provider with the goal being avoiding or delaying its complications and to take advantage of advances in medications and technology.  If appropriate actions are taken early enough, type 2 diabetes can even be   reversed.  Seek information from the right source.  - Whole Food, Plant Predominant Nutrition is highly recommended: Eat Plenty of vegetables, Mushrooms, fruits, Legumes, Whole Grains, Nuts, seeds in lieu of processed meats, processed snacks/pastries red meat, poultry, eggs as recommended by American College of  Lifestyle Medicine (ACLM).  -Type 2 diabetes is known to coexist with other important comorbidities such as high blood pressure and high cholesterol.  It is critical to control not only the  diabetes but also the high blood pressure and high cholesterol to minimize and delay the risk of complications including coronary artery disease, stroke, amputations, blindness, etc.  The good news is that this diet recommendation for type 2 diabetes is also very helpful for managing high cholesterol and high blood blood pressure.  - Studies showed that people with diabetes will benefit from a class of medications known as ACE inhibitors and statins.  Unless there are specific reasons not to be on these medications, the standard of care is to consider getting one from these groups of medications at an optimal doses.  These medications are generally considered safe and proven to help protect the heart and the kidneys.    - People with diabetes are encouraged to initiate and maintain regular follow-up with eye doctors, foot doctors, dentists , and if necessary heart and kidney doctors.     - It is highly recommended that people with diabetes quit smoking or stay away from smoking, and get yearly  flu vaccine and pneumonia vaccine at least every 5 years.  See above for additional recommendations on exercise, sleep, stress management , and healthy social connections.      

## 2023-02-03 DIAGNOSIS — N186 End stage renal disease: Secondary | ICD-10-CM | POA: Diagnosis not present

## 2023-02-03 DIAGNOSIS — Z940 Kidney transplant status: Secondary | ICD-10-CM | POA: Diagnosis not present

## 2023-02-03 DIAGNOSIS — I10 Essential (primary) hypertension: Secondary | ICD-10-CM | POA: Diagnosis not present

## 2023-02-04 DIAGNOSIS — N186 End stage renal disease: Secondary | ICD-10-CM | POA: Diagnosis not present

## 2023-02-04 DIAGNOSIS — D631 Anemia in chronic kidney disease: Secondary | ICD-10-CM | POA: Diagnosis not present

## 2023-02-04 DIAGNOSIS — Z940 Kidney transplant status: Secondary | ICD-10-CM | POA: Diagnosis not present

## 2023-02-04 DIAGNOSIS — I10 Essential (primary) hypertension: Secondary | ICD-10-CM | POA: Diagnosis not present

## 2023-02-25 ENCOUNTER — Ambulatory Visit: Payer: BC Managed Care – PPO | Admitting: "Endocrinology

## 2023-03-03 ENCOUNTER — Telehealth: Payer: Self-pay

## 2023-03-03 ENCOUNTER — Other Ambulatory Visit (HOSPITAL_COMMUNITY): Payer: Self-pay

## 2023-03-03 ENCOUNTER — Encounter: Payer: Self-pay | Admitting: "Endocrinology

## 2023-03-03 ENCOUNTER — Ambulatory Visit: Payer: BC Managed Care – PPO | Admitting: "Endocrinology

## 2023-03-03 VITALS — BP 132/78 | HR 76 | Ht 64.0 in | Wt 152.8 lb

## 2023-03-03 DIAGNOSIS — N1832 Chronic kidney disease, stage 3b: Secondary | ICD-10-CM

## 2023-03-03 DIAGNOSIS — I10 Essential (primary) hypertension: Secondary | ICD-10-CM

## 2023-03-03 DIAGNOSIS — Z7984 Long term (current) use of oral hypoglycemic drugs: Secondary | ICD-10-CM | POA: Diagnosis not present

## 2023-03-03 DIAGNOSIS — E1122 Type 2 diabetes mellitus with diabetic chronic kidney disease: Secondary | ICD-10-CM | POA: Diagnosis not present

## 2023-03-03 DIAGNOSIS — E782 Mixed hyperlipidemia: Secondary | ICD-10-CM | POA: Diagnosis not present

## 2023-03-03 MED ORDER — TRESIBA FLEXTOUCH 100 UNIT/ML ~~LOC~~ SOPN: 20.0000 [IU] | PEN_INJECTOR | Freq: Every day | SUBCUTANEOUS | 1 refills | Status: DC

## 2023-03-03 MED ORDER — BD PEN NEEDLE NANO U/F 32G X 4 MM MISC
1.0000 | Freq: Four times a day (QID) | 1 refills | Status: DC
Start: 2023-03-03 — End: 2024-01-11

## 2023-03-03 NOTE — Telephone Encounter (Signed)
 Pharmacy Patient Advocate Encounter   Received notification from Pt Calls Messages that prior authorization for Freestyle libre 3 plus is required/requested.   Insurance verification completed.   The patient is insured through [employer] .   Per test claim: PA required; PA submitted to above mentioned insurance via CoverMyMeds Key/confirmation #/[id number] Status is pending

## 2023-03-03 NOTE — Telephone Encounter (Signed)
 Pt states he needs a prior authorization for his Libre 3 Plus sensors.

## 2023-03-03 NOTE — Progress Notes (Signed)
 Endocrinology Consult Note       03/03/2023, 10:05 AM   Subjective:    Patient ID: [name], male    DOB: [birth year].  Derry Mcclymont is being seen in consultation for management of currently uncontrolled symptomatic diabetes requested by  Pcp, No.   Past Medical History:  Diagnosis Date   DDD (degenerative disc disease), lumbar    Diabetes mellitus    Hypertension    Polycystic kidney disease     Past Surgical History:  Procedure Laterality Date   NEPHRECTOMY TRANSPLANTED ORGAN      Social History   Socioeconomic History   Marital status: Single    Spouse name: Not on file   Number of children: Not on file   Years of education: Not on file   Highest education level: Not on file  Occupational History   Not on file  Tobacco Use   Smoking status: Never   Smokeless tobacco: Never  Substance and Sexual Activity   Alcohol use: No    Comment: rarely   Drug use: No   Sexual activity: Not on file  Other Topics Concern   Not on file  Social History Narrative   Not on file   Social Drivers of Health   Financial Resource Strain: Not on file  Food Insecurity: Not on file  Transportation Needs: Not on file  Physical Activity: Not on file  Stress: Not on file  Social Connections: Not on file    Family History  Problem Relation Age of Onset   Diabetes Mother    Kidney disease Mother    Stroke Mother    Kidney disease Father    Hypertension Father    Stroke Father    Heart disease Father     Outpatient Encounter Medications as of 03/03/2023  Medication Sig   insulin  degludec (TRESIBA  FLEXTOUCH) 100 UNIT/ML FlexTouch Pen Inject 20 Units into the skin at bedtime.   Insulin  Pen Needle (BD PEN NEEDLE NANO U/F) 32G X 4 MM MISC 1 each by Does not apply route 4 (four) times daily.   amLODipine  (NORVASC ) 10 MG tablet Take 10 mg by mouth daily.   aspirin  EC 81 MG tablet Take 81 mg by  mouth daily.   carvedilol (COREG) 6.25 MG tablet Take 6.25 mg by mouth 2 (two) times daily.   Continuous Glucose Receiver (FREESTYLE LIBRE 3 READER) DEVI 1 Piece by Does not apply route once as needed for up to 1 dose.   Continuous Glucose Sensor (FREESTYLE LIBRE 3 PLUS SENSOR) MISC Change sensor every 15 days.   elvitegravir-cobicistat-emtricitabine-tenofovir (GENVOYA) 150-150-200-10 MG TABS tablet Take 1 tablet by mouth daily with breakfast. (Patient not taking: Reported on 02/02/2023)   enalapril  (VASOTEC ) 5 MG tablet Take 20 mg by mouth daily.   glimepiride  (AMARYL ) 2 MG tablet Take 1 tablet (2 mg total) by mouth daily with breakfast.   glucose blood (TRUE METRIX BLOOD GLUCOSE TEST) test strip Use as instructed   JARDIANCE 10 MG TABS tablet Take 10 mg by mouth daily.   mycophenolate  (CELLCEPT ) 250 MG capsule Take 1,000 mg by mouth 2 (two) times daily.  oxyCODONE  (OXY IR/ROXICODONE ) 5 MG immediate release tablet Take by mouth.   predniSONE  (DELTASONE ) 5 MG tablet Take 5 mg by mouth daily.   simvastatin  (ZOCOR ) 20 MG tablet Take 20 mg by mouth daily. (Patient not taking: Reported on 02/02/2023)   tacrolimus  (PROGRAF ) 1 MG capsule Take 2 mg by mouth 2 (two) times daily.   tetrahydrozoline-zinc (VISINE-AC) 0.05-0.25 % ophthalmic solution Place 2 drops into both eyes 3 (three) times daily as needed. Dry/Red Eyes   [DISCONTINUED] insulin  regular (NOVOLIN R) 100 units/mL injection Inject 10 Units into the skin 3 (three) times daily with meals. (Patient not taking: Reported on 02/02/2023)   No facility-administered encounter medications on file as of 03/03/2023.    ALLERGIES: No Known Allergies  VACCINATION STATUS:  There is no immunization history on file for this patient.  Diabetes He presents for his follow-up diabetic visit. Diabetes type: Renal transplant related diabetes. His disease course has been worsening. Hypoglycemia symptoms include headaches. Pertinent negatives for hypoglycemia  include no confusion, pallor or seizures. Associated symptoms include polydipsia and polyuria. Pertinent negatives for diabetes include no chest pain, no fatigue, no polyphagia and no weakness. Symptoms are worsening. Diabetic complications include nephropathy. 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  He is currently on Jardiance 10 mg p.o. daily, and glimepiride  4 mg p.o. daily, metformin 1000 mg p.o. twice daily.  He is transplanted kidney is also failing with GFR of 33.  Patient was offered insulin  treatment at 1 point, however he is currently not taking any insulin .) Risk factors for coronary artery disease include dyslipidemia, family history, male sex and sedentary lifestyle. His weight is fluctuating minimally. He is following a generally unhealthy diet. When asked about meal planning, he reported none. He rarely participates in exercise. His home blood glucose trend is increasing steadily. His overall blood glucose range is >200 mg/dl. (Patient brought his meter average showing 206 tach 212 mg per DL for the last 14 days.  His point-of-care A1c is 8.4%.  He denies hypoglycemia.   ) An ACE inhibitor/angiotensin II receptor blocker is being taken.  Hypertension This is a chronic problem. The current episode started more than 1 year ago. The problem is uncontrolled. Associated symptoms include headaches. Pertinent negatives include no chest pain, neck pain, palpitations or shortness of breath. Risk factors for coronary artery disease include diabetes mellitus, male gender and sedentary lifestyle. Past treatments include ACE inhibitors. Hypertensive end-organ damage includes kidney disease. Identifiable causes of hypertension include chronic renal disease.     Review of Systems  Constitutional:  Negative for chills, fatigue, fever and unexpected weight change.  HENT:  Negative for dental problem, mouth sores and trouble swallowing.    Eyes:  Negative for visual disturbance.  Respiratory:  Negative for cough, choking, chest tightness, shortness of breath and wheezing.   Cardiovascular:  Negative for chest pain, palpitations and leg swelling.  Gastrointestinal:  Negative for abdominal distention, abdominal pain, constipation, diarrhea, nausea and vomiting.  Endocrine: Positive for polydipsia and polyuria. Negative for polyphagia.  Genitourinary:  Negative for dysuria, flank pain, hematuria and urgency.  Musculoskeletal:  Negative for back pain, gait problem, myalgias and neck pain.  Skin:  Negative for pallor, rash and wound.  Neurological:  Positive for headaches. Negative for seizures, syncope, weakness and numbness.  Psychiatric/Behavioral:  Negative for confusion and dysphoric mood.     Objective:       03/03/2023    9:26  AM 02/02/2023    2:15 PM 02/06/2020    9:53 AM  Vitals with BMI  Height 5\' 4"  5\' 4"  5\' 4"   Weight 152 lbs 13 oz 148 lbs 3 oz 148 lbs  BMI 26.22 25.43 25.39  Systolic 132 150 161  Diastolic 78 84 98  Pulse 76 100 80    BP 132/78   Pulse 76   Ht 5\' 4"  (1.626 m)   Wt 152 lb 12.8 oz (69.3 kg)   BMI 26.23 kg/m   Wt Readings from Last 3 Encounters:  03/03/23 152 lb 12.8 oz (69.3 kg)  02/02/23 148 lb 3.2 oz (67.2 kg)  02/06/20 148 lb (67.1 kg)      CMP ( most recent) CMP     Component Value Date/Time   NA 133 (L) 10/21/2018 2048   K 4.0 10/21/2018 2048   CL 99 10/21/2018 2048   CO2 25 10/21/2018 2048   GLUCOSE 334 (H) 10/21/2018 2048   BUN 22 (H) 10/21/2018 2048   CREATININE 1.55 (H) 10/21/2018 2048   CALCIUM 9.4 10/21/2018 2048   PROT 7.5 05/28/2017 0853   ALBUMIN 3.7 05/28/2017 0853   AST 22 05/28/2017 0853   ALT 18 05/28/2017 0853   ALKPHOS 91 05/28/2017 0853   BILITOT 1.1 05/28/2017 0853   GFRNONAA 51 (L) 10/21/2018 2048     Diabetic Labs (most recent): Lab Results  Component Value Date   HGBA1C 8.5 (A) 02/02/2023   HGBA1C 9.1 (H) 11/14/2014     Assessment &  Plan:   1. Type 2 diabetes mellitus with other specified complication, unspecified whether long term insulin  use (HCC) (Primary)  - Jamah Pallone has currently uncontrolled symptomatic type 2 DM since  58 years of age. Patient brought his meter average showing 206 tach 212 mg per DL for the last 14 days.  His point-of-care A1c is 8.4%.  He denies hypoglycemia.   Recent labs reviewed. 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  He reports that he was diagnosed with diabetes right after his transplant.  He is currently on Jardiance 10 mg p.o. daily, and glimepiride  4 mg p.o. daily, metformin 1000 mg p.o. twice daily.  He is transplanted kidney is also failing with GFR of 33.  Patient was offered insulin  treatment at 1 point, however he is currently not taking any insulin .  - I had a long discussion with him about the possible risk factors and  the pathology behind diabetes and its complications. -his diabetes is complicated by CKD and he remains at a high risk for more acute and chronic complications which include CAD, CVA, CKD, retinopathy, and neuropathy. These are all discussed in detail with him.  - I discussed all available options of managing his diabetes including de-escalation of medications. I have counseled him on Food as Medicine by adopting a Whole Food , Plant Predominant  ( WFPP) nutrition as recommended by [employer] of Lifestyle Medicine. Patient is encouraged to switch to  unprocessed or minimally processed  complex starch, adequate protein intake (mainly plant source), minimal liquid fat, plenty of fruits, and vegetables. -  he is advised to stick to a routine mealtimes to eat 3 complete meals a day and snack only when necessary (to snack only to correct hypoglycemia BG <70 day time or <100 at night).  - he acknowledges that there is a room for improvement in his food and drink choices. - Further Specific Suggestion  is made for him to avoid simple carbohydrates  from his diet including Cakes, Sweet Desserts, Ice Cream, Soda (diet and regular), Sweet Tea, Candies, Chips, Cookies, Store Bought Juices, Alcohol ,  Artificial Sweeteners,  Coffee Creamer, and "Sugar-free" Products. This will help patient to have more stable blood glucose profile and potentially avoid unintended weight gain.   - he will be scheduled with Penny Crumpton, RDN, CDE for individualized diabetes education.  - I have approached him with the following individualized plan to manage  his diabetes and patient agrees:   -He is not a candidate for metformin treatment.  He is advised to continue Jardiance 10 mg p.o. daily at breakfast, glimepiride  2 mg p.o. daily at breakfast.   -He will benefit from a basal insulin .  I discussed and initiated Tresiba  20 units nightly.   -He is advised to monitor blood glucose at least twice a day-before breakfast and at bedtime until he gets his CGM device.  - he is encouraged to call clinic for blood glucose levels less than 70 or above 200 mg /dl.  - he will be considered for incretin therapy as appropriate next visit.  - Specific targets for  A1c;  LDL, HDL,  and Triglycerides were discussed with the patient.  2) Blood Pressure /Hypertension: His blood pressure is controlled to target. He is advised to continue enalapril  5 mg p.o. daily, amlodipine  10 mg p.o. daily,  Coreg 6.25 mg p.o. twice daily.  I did not adjust his blood pressure medications, defer to nephrology.  He is advised to be consistent on his current blood pressure medications.  3) Lipids/Hyperlipidemia: He does not have recent lipid panel to review.  He is advised to continue simvastatin  20 mg p.o. nightly.    Side effects and precautions discussed with him.  4)  Weight/Diet:  Body mass index is 26.23 kg/m.  -   he is not a candidate for major weight loss. I discussed with him the fact that loss of 5 - 10% of his  current body weight will  have the most impact on his diabetes management.  The above detailed  ACLM recommendations for nutrition, exercise, sleep, social life, avoidance of risky substances, the need for restorative sleep  information is also detailed on discharge instructions.  5) Chronic Care/Health Maintenance:  -he  is on ACEI/ARB and Statin medications and  is encouraged to initiate and continue to follow up with Ophthalmology, Dentist,  Podiatrist at least yearly or according to recommendations, and advised to   stay away from smoking. I have recommended yearly flu vaccine and pneumonia vaccine at least every 5 years; moderate intensity exercise for up to 150 minutes weekly; and  sleep for 7- 9 hours a day.  - he is  advised to maintain close follow up with Pcp, No for primary care needs, as well as his other providers for optimal and coordinated care.    I spent  26  minutes in the care of the patient today including review of labs from CMP, Lipids, Thyroid Function, Hematology (current and previous including abstractions from other facilities); face-to-face time discussing  his blood glucose readings/logs, discussing hypoglycemia and hyperglycemia episodes and symptoms, medications doses, his options of short and long term treatment based on the latest standards of care / guidelines;  discussion about incorporating lifestyle medicine;  and documenting the encounter. Risk reduction counseling performed per USPSTF guidelines to reduce   cardiovascular risk factors.     Please refer to Patient Instructions for  Blood Glucose Monitoring and Insulin /Medications Dosing Guide"  in media tab for additional information. Please  also refer to " Patient Self Inventory" in the Media  tab for reviewed elements of pertinent patient history.  Margene Sheen participated in the discussions, expressed understanding, and voiced agreement with the above plans.  All questions were answered to his satisfaction. he is encouraged to contact  clinic should he have any questions or concerns prior to his return visit.   Follow up plan: - Return in about 4 months (around 07/01/2023) for F/U with Pre-visit Labs, Meter/CGM/Logs, A1c here.  Kalvin Orf, MD [facility] [address] [place], [place] [address] Phone: [phone]  Fax: [phone]    03/03/2023, 10:05 AM  This note was partially dictated with voice recognition software. Similar sounding words can be transcribed inadequately or may not  be corrected upon review.

## 2023-03-03 NOTE — Patient Instructions (Signed)

## 2023-03-09 ENCOUNTER — Ambulatory Visit
Payer: BC Managed Care – PPO | Attending: Student in an Organized Health Care Education/Training Program | Admitting: Student in an Organized Health Care Education/Training Program

## 2023-03-09 ENCOUNTER — Encounter: Payer: Self-pay | Admitting: Student in an Organized Health Care Education/Training Program

## 2023-03-09 VITALS — BP 136/81 | HR 81 | Temp 97.9°F | Resp 16 | Ht 64.0 in | Wt 155.0 lb

## 2023-03-09 DIAGNOSIS — Z940 Kidney transplant status: Secondary | ICD-10-CM

## 2023-03-09 DIAGNOSIS — M47816 Spondylosis without myelopathy or radiculopathy, lumbar region: Secondary | ICD-10-CM

## 2023-03-09 DIAGNOSIS — Q613 Polycystic kidney, unspecified: Secondary | ICD-10-CM | POA: Diagnosis not present

## 2023-03-09 DIAGNOSIS — G894 Chronic pain syndrome: Secondary | ICD-10-CM | POA: Diagnosis not present

## 2023-03-09 NOTE — Progress Notes (Signed)
Safety precautions to be maintained throughout the outpatient stay will include: orient to surroundings, keep bed in low position, maintain call bell within reach at all times, provide assistance with transfer out of bed and ambulation.  

## 2023-03-09 NOTE — Telephone Encounter (Signed)
Tried to call pt, did not receive an answer and was unable to leave a message. 
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
## 2023-03-09 NOTE — Telephone Encounter (Signed)
Pharmacy Patient Advocate Encounter  Received notification from [facility] that Prior Authorization for [facility] 3 plus has been DENIED.  Full denial letter will be uploaded to the media tab. See denial reason below.  We may be able to approve this device in  a certain situation (if you need insulin more than once a day or you need a certain device  [insulin pump] to control your blood sugar). We do not see that this applies to you  PA #/Case ID/Reference #: [id number]

## 2023-03-10 ENCOUNTER — Ambulatory Visit (HOSPITAL_COMMUNITY)
Admission: RE | Admit: 2023-03-10 | Discharge: 2023-03-10 | Disposition: A | Discharge status: Home / Self Care | Payer: BC Managed Care – PPO | Source: Ambulatory Visit | Attending: Student in an Organized Health Care Education/Training Program | Admitting: Student in an Organized Health Care Education/Training Program

## 2023-03-10 DIAGNOSIS — M47814 Spondylosis without myelopathy or radiculopathy, thoracic region: Secondary | ICD-10-CM | POA: Diagnosis not present

## 2023-03-10 DIAGNOSIS — M47816 Spondylosis without myelopathy or radiculopathy, lumbar region: Secondary | ICD-10-CM | POA: Insufficient documentation

## 2023-03-10 DIAGNOSIS — M5136 Other intervertebral disc degeneration, lumbar region with discogenic back pain only: Secondary | ICD-10-CM | POA: Diagnosis not present

## 2023-03-10 NOTE — Telephone Encounter (Signed)
Pt made aware

## 2023-03-11 LAB — COMPLIANCE DRUG ANALYSIS, UR

## 2023-03-16 ENCOUNTER — Ambulatory Visit (HOSPITAL_COMMUNITY): Payer: BC Managed Care – PPO

## 2023-03-18 ENCOUNTER — Ambulatory Visit (HOSPITAL_COMMUNITY): Payer: BC Managed Care – PPO | Attending: Student in an Organized Health Care Education/Training Program

## 2023-03-18 ENCOUNTER — Other Ambulatory Visit: Payer: Self-pay

## 2023-03-18 DIAGNOSIS — G894 Chronic pain syndrome: Secondary | ICD-10-CM | POA: Diagnosis not present

## 2023-03-18 DIAGNOSIS — M47816 Spondylosis without myelopathy or radiculopathy, lumbar region: Secondary | ICD-10-CM | POA: Insufficient documentation

## 2023-03-18 DIAGNOSIS — M5459 Other low back pain: Secondary | ICD-10-CM | POA: Insufficient documentation
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
## 2023-03-22 ENCOUNTER — Ambulatory Visit: Payer: Self-pay | Admitting: Nutrition

## 2023-03-24 ENCOUNTER — Ambulatory Visit (HOSPITAL_COMMUNITY): Payer: BC Managed Care – PPO | Admitting: Physical Therapy

## 2023-03-25 ENCOUNTER — Ambulatory Visit
Payer: BC Managed Care – PPO | Attending: Student in an Organized Health Care Education/Training Program | Admitting: Student in an Organized Health Care Education/Training Program

## 2023-03-25 ENCOUNTER — Encounter: Payer: Self-pay | Admitting: Student in an Organized Health Care Education/Training Program

## 2023-03-25 VITALS — BP 171/96 | HR 79 | Resp 16 | Ht 65.0 in | Wt 150.0 lb

## 2023-03-25 DIAGNOSIS — Z0289 Encounter for other administrative examinations: Secondary | ICD-10-CM | POA: Diagnosis not present

## 2023-03-25 DIAGNOSIS — M47816 Spondylosis without myelopathy or radiculopathy, lumbar region: Secondary | ICD-10-CM | POA: Insufficient documentation

## 2023-03-25 DIAGNOSIS — Q613 Polycystic kidney, unspecified: Secondary | ICD-10-CM | POA: Insufficient documentation

## 2023-03-25 DIAGNOSIS — G894 Chronic pain syndrome: Secondary | ICD-10-CM | POA: Insufficient documentation

## 2023-03-25 DIAGNOSIS — Z940 Kidney transplant status: Secondary | ICD-10-CM | POA: Diagnosis not present

## 2023-03-25 DIAGNOSIS — M51369 Other intervertebral disc degeneration, lumbar region without mention of lumbar back pain or lower extremity pain: Secondary | ICD-10-CM | POA: Insufficient documentation

## 2023-03-25 MED ORDER — OXYCODONE-ACETAMINOPHEN 5-325 MG PO TABS: 1.0000 | ORAL_TABLET | Freq: Four times a day (QID) | ORAL | 0 refills | Status: AC | PRN

## 2023-03-25 NOTE — Patient Instructions (Signed)
 Sign pain contract.

## 2023-03-25 NOTE — Progress Notes (Signed)
Safety precautions to be maintained throughout the outpatient stay will include: orient to surroundings, keep bed in low position, maintain call bell within reach at all times, provide assistance with transfer out of bed and ambulation.   Pt instructed to notify PCP of elevated BP today.
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
## 2023-03-26 ENCOUNTER — Ambulatory Visit (HOSPITAL_COMMUNITY): Payer: BC Managed Care – PPO

## 2023-03-29 ENCOUNTER — Telehealth (HOSPITAL_COMMUNITY): Payer: Self-pay | Admitting: Physical Therapy

## 2023-03-29 ENCOUNTER — Encounter (HOSPITAL_COMMUNITY): Payer: BC Managed Care – PPO | Admitting: Physical Therapy

## 2023-03-29 NOTE — Telephone Encounter (Signed)
Pt did not show for appt. Unable to reach by phone.  Yuliya Nova B Hamdan Toscano, PTA/CLT [facility] Ph: [phone]  

## 2023-03-31 ENCOUNTER — Telehealth (HOSPITAL_COMMUNITY): Payer: Self-pay | Admitting: Physical Therapy

## 2023-03-31 ENCOUNTER — Encounter (HOSPITAL_COMMUNITY): Payer: BC Managed Care – PPO | Admitting: Physical Therapy

## 2023-03-31 NOTE — Telephone Encounter (Signed)
Pt did not show for appt.  Called pt and states he was unaware of appts scheduled as he has not checked his reminders and his schedule is in his car at the shop.  Explained NS policy and reminder for his next appt.  Lurena Nida, PTA/CLT [facility] Ph: [phone]

## 2023-04-07 ENCOUNTER — Encounter (HOSPITAL_COMMUNITY): Payer: BC Managed Care – PPO

## 2023-04-09 ENCOUNTER — Telehealth (HOSPITAL_COMMUNITY): Payer: Self-pay

## 2023-04-09 ENCOUNTER — Encounter (HOSPITAL_COMMUNITY): Payer: BC Managed Care – PPO

## 2023-04-09 NOTE — Telephone Encounter (Signed)
 Called patient today to follow up with his appointment. Patient states that he left a voice message (verified with Patsy Lager) saying that he won't be able to come in due to conflict with work schedule. Reminded patient of the cancellation/no show policy and his next appointment. Appointment was canceled today.  Tish Frederickson. Siah Kannan, PT, DPT, OCS Board-Certified Clinical Specialist in Orthopedic PT PT Compact Privilege # ([place]): [phone]

## 2023-04-13 ENCOUNTER — Ambulatory Visit (HOSPITAL_COMMUNITY)
Payer: BC Managed Care – PPO | Attending: Student in an Organized Health Care Education/Training Program | Admitting: Physical Therapy

## 2023-04-13 DIAGNOSIS — M5459 Other low back pain: Secondary | ICD-10-CM | POA: Insufficient documentation
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
## 2023-04-15 ENCOUNTER — Ambulatory Visit (HOSPITAL_COMMUNITY): Payer: BC Managed Care – PPO

## 2023-04-15 DIAGNOSIS — M5459 Other low back pain: Secondary | ICD-10-CM
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
## 2023-04-19 ENCOUNTER — Other Ambulatory Visit (HOSPITAL_COMMUNITY): Payer: Self-pay

## 2023-04-19 ENCOUNTER — Telehealth: Payer: Self-pay

## 2023-04-19 NOTE — Telephone Encounter (Signed)
 Pharmacy Patient Advocate Encounter   Received notification from CoverMyMeds that prior authorization for Glimepiride is required/requested.   Insurance verification completed.   The patient is insured through [employer] .   Per test claim: PA required; PA submitted to above mentioned insurance via CoverMyMeds Key/confirmation #/EOC [id number] Status is pending

## 2023-04-20 NOTE — Telephone Encounter (Signed)
 Pharmacy Patient Advocate Encounter  Received notification from [facility] that Prior Authorization for Glimepiride has been APPROVED through 04/17/24   PA #/Case ID/Reference #: [id number]

## 2023-04-21 ENCOUNTER — Encounter (HOSPITAL_COMMUNITY): Payer: Self-pay

## 2023-04-21 ENCOUNTER — Ambulatory Visit (HOSPITAL_COMMUNITY): Payer: BC Managed Care – PPO | Attending: Student in an Organized Health Care Education/Training Program

## 2023-04-21 DIAGNOSIS — M5459 Other low back pain: Secondary | ICD-10-CM | POA: Diagnosis not present

## 2023-04-21 NOTE — Telephone Encounter (Signed)
Tried to call pt did not receive an answer and was unable to leave a message. ?
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
## 2023-04-27 ENCOUNTER — Encounter: Payer: Self-pay | Attending: "Endocrinology | Admitting: Nutrition

## 2023-04-27 DIAGNOSIS — Z940 Kidney transplant status: Secondary | ICD-10-CM

## 2023-04-27 DIAGNOSIS — E782 Mixed hyperlipidemia: Secondary | ICD-10-CM

## 2023-04-27 DIAGNOSIS — Z794 Long term (current) use of insulin: Secondary | ICD-10-CM | POA: Insufficient documentation

## 2023-04-27 DIAGNOSIS — E119 Type 2 diabetes mellitus without complications: Secondary | ICD-10-CM | POA: Insufficient documentation

## 2023-04-27 DIAGNOSIS — Z713 Dietary counseling and surveillance: Secondary | ICD-10-CM | POA: Insufficient documentation

## 2023-04-27 DIAGNOSIS — E0800 Diabetes mellitus due to underlying condition with hyperosmolarity without nonketotic hyperglycemic-hyperosmolar coma (NKHHC): Secondary | ICD-10-CM

## 2023-04-27 DIAGNOSIS — I10 Essential (primary) hypertension: Secondary | ICD-10-CM

## 2023-04-27 NOTE — Progress Notes (Unsigned)
 Medical Nutrition Therapy  Appointment Start time:  [phone]  Appointment End time:  0945  Primary concerns today: Dm Type 2, Kidney Transplant Referral diagnosis: E11.69 Preferred learning style: Visual and hands on (auditory, visual, hands on, no preference indicated) Learning readiness: Ready  NUTRITION ASSESSMENT  58 yr old bmale here with his significant other. "I haven't been taking my medication like I am suppose to. I haven't been testing my blood sugar. I am going to do better. I have had a kidney transplant." Kidney transplant 2007. Sees Dr. Lowell Guitar in [place]. Needs to schedule follow up since missed appointment due to bad weather.  A1C 8.5%. Creatine 2.35 mg/dl and eGFR 32 .  Sees Dr. Fransico Him, [facility].  He doesn't have a PCP.  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 Not taking insulin and non compliant with other medications.. Doesn't have CGM yet. Girlfriend will help contacting pharmacy to help get it taken care of. They contact [facility] to see about becoming a new patient. Followed by pain clinic. He notes he has had chronic back pain for a long time-10+ years. He notes he doesn't take pain pills chronically. He doesn't think he is addicted. "Just takes them when I need them every so often." Diet is poor. Doesn't eat meals on time, drinks sodas some and a lot of processed foods. Willing to work on doing better with his diet.  He is willing to work on eating bettter and being more complaint with medications.   Clinical Medical Hx: kidney transplant in 2007.  Medications: . Labs:  Lab Results  Component Value Date   HGBA1C 8.5 (A) 02/02/2023      Latest Ref Rng & Units 10/21/2018    8:48 PM 05/28/2017    8:53 AM 08/19/2016    7:16 PM  CMP  Glucose 70 - 99 mg/dL 130  865  784   BUN 6 - 20 mg/dL 22  17  18    Creatinine 0.61 - 1.24 mg/dL 6.96  2.95  2.84   Sodium 135 - 145 mmol/L 133  135  136   Potassium 3.5 - 5.1 mmol/L 4.0  3.9   4.2   Chloride 98 - 111 mmol/L 99  101  102   CO2 22 - 32 mmol/L 25  21  26    Calcium 8.9 - 10.3 mg/dL 9.4  9.5  9.3   Total Protein 6.5 - 8.1 g/dL  7.5  7.5   Total Bilirubin 0.3 - 1.2 mg/dL  1.1  0.9   Alkaline Phos 38 - 126 U/L  91  87   AST 15 - 41 U/L  22  14   ALT 17 - 63 U/L  18  12    Lipid Panel  No results found for: "CHOL", "TRIG", "HDL", "CHOLHDL", "VLDL", "LDLCALC", "LDLDIRECT", "LABVLDL"  Notable Signs/Symptoms: Increased thirst, frequent urination, fatigue, blurry vision.  Lifestyle & Dietary Hx Lives by himself. Has a girlfreind Works as a Clinical biochemist part time  Estimated daily fluid intake: 40 oz Supplements:  Sleep: poor Stress / self-care: his health Current average weekly physical activity: ADL  24-Hr Dietary Recall Eats randomly. Works 2 and 3rd shift sometimes.  Estimated Energy Needs Calories: 1800 Carbohydrate: 200g Protein: 135g Fat: 50g   NUTRITION DIAGNOSIS  NB-1.1 Food and nutrition-related knowledge deficit As related to Diabetes Type 2 and CKD.  As evidenced by A1C 8.4 and.eGFR 33   NUTRITION INTERVENTION  Nutrition education (E-1) on the  following topics:  Nutrition and Diabetes education provided on My Plate, CHO counting, meal planning, portion sizes, timing of meals, avoiding snacks between meals unless having a low blood sugar, target ranges for A1C and blood sugars, signs/symptoms and treatment of hyper/hypoglycemia, monitoring blood sugars, taking medications as prescribed, benefits of exercising 30 minutes per day and prevention of complications of DM.  Lifestyle Medicine  - Whole Food, Plant Predominant Nutrition is highly recommended: Eat Plenty of vegetables, Mushrooms, fruits, Legumes, Whole Grains, Nuts, seeds in lieu of processed meats, processed snacks/pastries red meat, poultry, eggs.    -It is better to avoid simple carbohydrates including: Cakes, Sweet Desserts, Ice Cream, Soda (diet and regular), Sweet Tea, Candies, Chips,  Cookies, Store Bought Juices, Alcohol in Excess of  1-2 drinks a day, Lemonade,  Artificial Sweeteners, Doughnuts, Coffee Creamers, "Sugar-free" Products, etc, etc.  This is not a complete list.....  Exercise: If you are able: 30 -60 minutes a day ,4 days a week, or 150 minutes a week.  The longer the better.  Combine stretch, strength, and aerobic activities.  If you were told in the past that you have high risk for cardiovascular diseases, you may seek evaluation by your heart doctor prior to initiating moderate to intense exercise programs.  Chronic kidney disease  Handouts Provided Include  Lifestyle Medicine Blood sugars log  Learning Style & Readiness for Change Teaching method utilized: Visual & Auditory  Demonstrated degree of understanding via: Teach Back  Barriers to learning/adherence to lifestyle change: commitment to being compliant to taking medications and diet restrictions  Goals Established by Pt Goals  Work on meal prepping and meal planning weekly. Cut out sodas, sweet tea and processed junk food Eat three meals per times discussed Take medications as prescribed Test blood sugars twice a day; before breakfast and before bed. Contact pharmacy and start taking insulin.   MONITORING & EVALUATION Dietary intake, weekly physical activity, and blood sguars in 1 month.  Next Steps  Patient is to start taking medication as prescribed.Marland Kitchen

## 2023-04-27 NOTE — Patient Instructions (Signed)
 Goals  Work on meal prepping and meal planning weekly. Cut out sodas, sweet tea and processed junk food Eat three meals per times discussed Take medications as prescribed Test blood sugars twice a day; before breakfast and before bed. Contact pharmacy to get CGM and start taking insulin.

## 2023-04-28 ENCOUNTER — Encounter: Payer: Self-pay | Admitting: Nutrition

## 2023-04-30 ENCOUNTER — Encounter (HOSPITAL_COMMUNITY): Payer: Self-pay

## 2023-04-30 ENCOUNTER — Ambulatory Visit (HOSPITAL_COMMUNITY): Payer: BC Managed Care – PPO

## 2023-04-30 DIAGNOSIS — M5459 Other low back pain: Secondary | ICD-10-CM | POA: Diagnosis not present
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
## 2023-05-04 DIAGNOSIS — Z940 Kidney transplant status: Secondary | ICD-10-CM | POA: Diagnosis not present

## 2023-05-04 DIAGNOSIS — N186 End stage renal disease: Secondary | ICD-10-CM | POA: Diagnosis not present

## 2023-05-04 DIAGNOSIS — D631 Anemia in chronic kidney disease: Secondary | ICD-10-CM | POA: Diagnosis not present

## 2023-05-04 DIAGNOSIS — I10 Essential (primary) hypertension: Secondary | ICD-10-CM | POA: Diagnosis not present

## 2023-05-06 ENCOUNTER — Ambulatory Visit (HOSPITAL_COMMUNITY): Payer: BC Managed Care – PPO

## 2023-05-06 ENCOUNTER — Encounter (HOSPITAL_COMMUNITY): Payer: Self-pay

## 2023-05-06 NOTE — Therapy (Signed)
 Pt arrived with reports of blood sugar to 95 and feels a little shaky today, stated he drank some apple juice prior session.    When began session pt reports he feels shaky and wishes to hold on today's session.  No treatment complete.  Becky Sax, LPTA/CLT; [place] [phone]
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
## 2023-05-14 ENCOUNTER — Ambulatory Visit (HOSPITAL_COMMUNITY): Payer: BC Managed Care – PPO

## 2023-05-14 ENCOUNTER — Encounter (HOSPITAL_COMMUNITY): Payer: Self-pay

## 2023-05-14 DIAGNOSIS — M5459 Other low back pain: Secondary | ICD-10-CM | POA: Diagnosis not present
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
## 2023-05-17 ENCOUNTER — Telehealth: Payer: Self-pay

## 2023-05-17 NOTE — Telephone Encounter (Signed)
 Tried to return call to pt, left a message requesting pt return call to the office.

## 2023-05-20 ENCOUNTER — Ambulatory Visit
Payer: BC Managed Care – PPO | Attending: Student in an Organized Health Care Education/Training Program | Admitting: Student in an Organized Health Care Education/Training Program

## 2023-05-20 ENCOUNTER — Encounter: Payer: Self-pay | Admitting: Student in an Organized Health Care Education/Training Program

## 2023-05-20 VITALS — BP 160/92 | HR 75 | Temp 98.1°F | Ht 65.0 in | Wt 150.0 lb

## 2023-05-20 DIAGNOSIS — G894 Chronic pain syndrome: Secondary | ICD-10-CM | POA: Diagnosis not present

## 2023-05-20 DIAGNOSIS — Q613 Polycystic kidney, unspecified: Secondary | ICD-10-CM | POA: Diagnosis not present

## 2023-05-20 DIAGNOSIS — Z0289 Encounter for other administrative examinations: Secondary | ICD-10-CM | POA: Diagnosis not present

## 2023-05-20 DIAGNOSIS — Z940 Kidney transplant status: Secondary | ICD-10-CM | POA: Diagnosis not present

## 2023-05-20 DIAGNOSIS — M47816 Spondylosis without myelopathy or radiculopathy, lumbar region: Secondary | ICD-10-CM | POA: Diagnosis not present

## 2023-05-20 MED ORDER — OXYCODONE HCL 5 MG PO TABS: 5.0000 mg | ORAL_TABLET | Freq: Four times a day (QID) | ORAL | 0 refills | Status: AC | PRN

## 2023-05-20 MED ORDER — OXYCODONE HCL 5 MG PO TABS: 5.0000 mg | ORAL_TABLET | Freq: Four times a day (QID) | ORAL | 0 refills | Status: DC | PRN

## 2023-05-20 NOTE — Progress Notes (Signed)
 Nursing Pain Medication Assessment:  Safety precautions to be maintained throughout the outpatient stay will include: orient to surroundings, keep bed in low position, maintain call bell within reach at all times, provide assistance with transfer out of bed and ambulation.  Medication Inspection Compliance: Pill count conducted under aseptic conditions, in front of the patient. Neither the pills nor the bottle was removed from the patient's sight at any time. Once count was completed pills were immediately returned to the patient in their original bottle.  Medication: Oxycodone/APAP Pill/Patch Count:  7 of 28 pills remain Pill/Patch Appearance: Markings consistent with prescribed medication Bottle Appearance: Standard pharmacy container. Clearly labeled. Filled Date: 3 / 72 / 2025 Last Medication intake:  TodaySafety precautions to be maintained throughout the outpatient stay will include: orient to surroundings, keep bed in low position, maintain call bell within reach at all times, provide assistance with transfer out of bed and ambulation.

## 2023-05-20 NOTE — Progress Notes (Signed)
 PROVIDER NOTE: Interpretation of information contained herein should be left to medically-trained personnel. Specific patient instructions are provided elsewhere under "Patient Instructions" section of medical record. This document was created in part using AI and STT-dictation technology, any transcriptional errors that may result from this process are unintentional.  Patient: [name]  Service: E/M   PCP: Pcp, No  DOB: [birth year]  DOS: 05/20/2023  Provider: Bettey Costa, NP  MRN: [id number]  Delivery: Face-to-face  Specialty: Interventional Pain Management  Type: Established Patient  Setting: Ambulatory outpatient facility  Specialty designation: 09  Referring Prov.: No ref. provider found  Location: Outpatient office facility       HPI  Mr. [name], a 58 y.o. year old male, is here today because of his Lumbar facet arthropathy [M47.816]. Mr. Pickup primary complain today is Back Pain (lower)   Pain Assessment: Severity of Chronic pain is reported as a 6 /10. Location: Back  / . Onset: More than a month ago. Quality: Aching, Burning, Constant. Timing: Constant. Modifying factor(s): physical therapy and meds. Vitals:  height is 5\' 5"  (1.651 m) and weight is 150 lb (68 kg). His temperature is 98.1 F (36.7 C). His blood pressure is 160/92 (abnormal) and his pulse is 75. His oxygen saturation is 100%.  BMI: Estimated body mass index is 24.96 kg/m as calculated from the following:   Height as of this encounter: 5\' 5"  (1.651 m).   Weight as of this encounter: 150 lb (68 kg). Last encounter: 03/25/2023. Last procedure: Visit date not found.  Reason for encounter: medication management.  Mr. [name], a 58 year old male here for his lower back pain which controlled with his pain medication, Dr. Cherylann Ratel talked to him about lumbar facet medial branch nerve block in his previous visit however he does not want nerve block and he wants to continue with his pain medication.     Pharmacotherapy Assessment  Analgesic: Oxycodone 5 mg tablet every 6 hours as needed for severe pain (pain score 7-10)  Monitoring: [place] PMP: PDMP reviewed during this encounter.       Pharmacotherapy: No side-effects or adverse reactions reported. Compliance: No problems identified. Effectiveness: Clinically acceptable.  Brigitte Pulse, RN  05/20/2023  9:22 AM  Sign when Signing Visit Nursing Pain Medication Assessment:  Safety precautions to be maintained throughout the outpatient stay will include: orient to surroundings, keep bed in low position, maintain call bell within reach at all times, provide assistance with transfer out of bed and ambulation.  Medication Inspection Compliance: Pill count conducted under aseptic conditions, in front of the patient. Neither the pills nor the bottle was removed from the patient's sight at any time. Once count was completed pills were immediately returned to the patient in their original bottle.  Medication: Oxycodone/APAP Pill/Patch Count:  7 of 28 pills remain Pill/Patch Appearance: Markings consistent with prescribed medication Bottle Appearance: Standard pharmacy container. Clearly labeled. Filled Date: 3 / 70 / 2025 Last Medication intake:  TodaySafety precautions to be maintained throughout the outpatient stay will include: orient to surroundings, keep bed in low position, maintain call bell within reach at all times, provide assistance with transfer out of bed and ambulation.   No results found for: "CBDTHCR" No results found for: "D8THCCBX" No results found for: "D9THCCBX"  UDS:  Summary  Date Value Ref Range Status  03/09/2023 FINAL  Final    Comment:    ==================================================================== Compliance Drug Analysis, Ur ==================================================================== Test  Result       Flag       Units  Drug Absent but Declared for Prescription  Verification   Salicylate                     Not Detected UNEXPECTED    Aspirin, as indicated in the declared medication list, is not always    detected even when used as directed.  ==================================================================== Test                      Result    Flag   Units      Ref Range   Creatinine              39               mg/dL      >=16 ==================================================================== Declared Medications:  The flagging and interpretation on this report are based on the  following declared medications.  Unexpected results may arise from  inaccuracies in the declared medications.   **Note: The testing scope of this panel does not include small to  moderate amounts of these reported medications:   Aspirin   **Note: The testing scope of this panel does not include the  following reported medications:   Amlodipine (Norvasc)  Carvedilol (Coreg)  Cobicistat (Genvoya)  Elvitegravir (Genvoya)  Empagliflozin (Jardiance)  Emtricitabine (Genvoya)  Enalapril (Vasotec)  Eye Drops  Glimepiride (Amaryl)  Insulin Evaristo Bury)  Mycophenolate mofetil (Cellcept)  Prednisone (Deltasone)  Simvastatin (Zocor)  Tacrolimus (Prograf)  Tenofovir (Genvoya) ==================================================================== For clinical consultation, please call [phone]. ====================================================================       ROS  Constitutional: Denies any fever or chills Gastrointestinal: No reported hemesis, hematochezia, vomiting, or acute GI distress Musculoskeletal:  low back pain Neurological: No reported episodes of acute onset apraxia, aphasia, dysarthria, agnosia, amnesia, paralysis, loss of coordination, or loss of consciousness  Medication Review  FreeStyle Libre 3 Plus Sensor, FreeStyle Libre 3 Reader, Insulin Pen Needle, amLODipine, aspirin EC, carvedilol,  elvitegravir-cobicistat-emtricitabine-tenofovir, empagliflozin, enalapril, glimepiride, glucose blood, insulin degludec, mycophenolate, oxyCODONE, oxyCODONE-acetaminophen, predniSONE, simvastatin, tacrolimus, and tetrahydrozoline-zinc  History Review  Allergy: Mr. [name] has no known allergies. Drug: Mr. Cuff  reports no history of drug use. Alcohol:  reports no history of alcohol use. Tobacco:  reports that he has never smoked. He has never used smokeless tobacco. Social: Mr. Montesinos  reports that he has never smoked. He has never used smokeless tobacco. He reports that he does not drink alcohol and does not use drugs. Medical:  has a past medical history of DDD (degenerative disc disease), lumbar, Diabetes mellitus, Hypertension, and Polycystic kidney disease. Surgical: Mr. Winton  has a past surgical history that includes Nephrectomy transplanted organ. Family: family history includes Diabetes in his mother; Heart disease in his father; Hypertension in his father; Kidney disease in his father and mother; Stroke in his father and mother.  Laboratory Chemistry Profile   Renal Lab Results  Component Value Date   BUN 22 (H) 10/21/2018   CREATININE 1.55 (H) 10/21/2018   GFRAA 59 (L) 10/21/2018   GFRNONAA 51 (L) 10/21/2018    Hepatic Lab Results  Component Value Date   AST 22 05/28/2017   ALT 18 05/28/2017   ALBUMIN 3.7 05/28/2017   ALKPHOS 91 05/28/2017   HCVAB <0.1 05/28/2017   LIPASE 23 03/10/2010    Electrolytes Lab Results  Component Value Date   NA 133 (L) 10/21/2018   K 4.0 10/21/2018  CL 99 10/21/2018   CALCIUM 9.4 10/21/2018    Bone No results found for: "VD25OH", "VD125OH2TOT", "DG3875IE3", "PI9518AC1", "25OHVITD1", "25OHVITD2", "25OHVITD3", "TESTOFREE", "TESTOSTERONE"  Inflammation (CRP: Acute Phase) (ESR: Chronic Phase) Lab Results  Component Value Date   LATICACIDVEN 0.93 11/14/2014         Note: Above Lab results reviewed.  Recent Imaging Review  DG  Lumbar Spine Complete W/Bend CLINICAL DATA:  Low back pain.  EXAM: LUMBAR SPINE - COMPLETE WITH BENDING VIEWS  COMPARISON:  Chest radiograph 10/21/2018  FINDINGS: Normal anatomic alignment. No evidence for acute fracture or dislocation. No evidence for static listhesis. No listhesis with flexion or extension identified. Lower thoracic spine degenerative changes. Degenerative disc disease of the lumbar spine most pronounced L3-4 and L4-5. Lower lumbar spine facet degenerative changes. SI joints are unremarkable.  IMPRESSION: Lower lumbar spine degenerative disc and facet disease.  Electronically Signed   By: Annia Belt M.D.   On: 03/13/2023 11:17 Note: Reviewed        Physical Exam  General appearance: alert, cooperative, oriented, and Well nourished, well developed, and well hydrated. In no apparent acute distress Mental status: Alert, oriented x 3 (person, place, & time)       Respiratory: No evidence of acute respiratory distress Eyes: PERLA Vitals: BP (!) 160/92   Pulse 75   Temp 98.1 F (36.7 C)   Ht 5\' 5"  (1.651 m)   Wt 150 lb (68 kg)   SpO2 100%   BMI 24.96 kg/m  BMI: Estimated body mass index is 24.96 kg/m as calculated from the following:   Height as of this encounter: 5\' 5"  (1.651 m).   Weight as of this encounter: 150 lb (68 kg). Ideal: Ideal body weight: 61.5 kg (135 lb 9.3 oz) Adjusted ideal body weight: 64.1 kg (141 lb 5.6 oz)  Assessment   Diagnosis Status  1. Lumbar facet arthropathy   2. Lumbar spondylosis   3. Polycystic kidney disease   4. Renal transplant recipient 2007 at [facility]   5. History of renal transplant   6. Chronic pain syndrome   7. Pain management contract signed    Controlled Controlled Controlled   Updated Problems:  Plan of Care  Problem-specific:  Assessment and Plan  Will continue on the pain medication Oxycodone 5 mg 1 tablet by mouth every 6 hours as needed for severe pain, MME- 30.  Patient had a kidney  transplant, educate potential risks with oxycodone on long-term. Educate to get alternate treatment for lower back pain that help to reduce pain level and benefit for daily activities. Patient understand.         Mr. Koleman Marling has a current medication list which includes the following long-term medication(s): amlodipine, carvedilol, enalapril, glimepiride, and simvastatin.  Pharmacotherapy (Medications Ordered): Meds ordered this encounter  Medications   oxyCODONE (OXY IR/ROXICODONE) 5 MG immediate release tablet    Sig: Take 1 tablet (5 mg total) by mouth every 6 (six) hours as needed for severe pain (pain score 7-10).    Dispense:  120 tablet    Refill:  0   oxyCODONE (OXY IR/ROXICODONE) 5 MG immediate release tablet    Sig: Take 1 tablet (5 mg total) by mouth every 6 (six) hours as needed for severe pain (pain score 7-10).    Dispense:  120 tablet    Refill:  0   oxyCODONE (OXY IR/ROXICODONE) 5 MG immediate release tablet    Sig: Take 1 tablet (5 mg total) by mouth every  6 (six) hours as needed for severe pain (pain score 7-10).    Dispense:  120 tablet    Refill:  0   Orders:  Orders Placed This Encounter  Procedures   ToxASSURE Select 13 (MW), Urine    Volume: 30 ml(s). Minimum 3 ml of urine is needed. Document temperature of fresh sample. Indications: Long term (current) use of opiate analgesic (Z61.096)    Release to patient:   Immediate   Follow-up plan:   Return in about 3 months (around 08/19/2023) for Randalyn Rhea, MM.    Recent Visits Date Type Provider Dept  03/25/23 Office Visit Edward Jolly, MD [facility]  03/09/23 Office Visit Edward Jolly, MD [facility]  Showing recent visits within past 90 days and meeting all other requirements Today's Visits Date Type Provider Dept  05/20/23 Office Visit Edward Jolly, MD [facility]  Showing today's visits and meeting all other requirements Future Appointments Date Type Provider Dept   08/12/23 Appointment Bettey Costa, NP [facility]  Showing future appointments within next 90 days and meeting all other requirements  I discussed the assessment and treatment plan with the patient. The patient was provided an opportunity to ask questions and all were answered. The patient agreed with the plan and demonstrated an understanding of the instructions.  Patient advised to call back or seek an in-person evaluation if the symptoms or condition worsens.  Duration of encounter:  30 minutes.  Total time on encounter, as per AMA guidelines included both the face-to-face and non-face-to-face time personally spent by the physician and/or other qualified health care professional(s) on the day of the encounter (includes time in activities that require the physician or other qualified health care professional and does not include time in activities normally performed by clinical staff). Physician's time may include the following activities when performed: Preparing to see the patient (e.g., pre-charting review of records, searching for previously ordered imaging, lab work, and nerve conduction tests) Review of prior analgesic pharmacotherapies. Reviewing PMP Interpreting ordered tests (e.g., lab work, imaging, nerve conduction tests) Performing post-procedure evaluations, including interpretation of diagnostic procedures Obtaining and/or reviewing separately obtained history Performing a medically appropriate examination and/or evaluation Counseling and educating the patient/family/caregiver Ordering medications, tests, or procedures Referring and communicating with other health care professionals (when not separately reported) Documenting clinical information in the electronic or other health record Independently interpreting results (not separately reported) and communicating results to the patient/ family/caregiver Care coordination (not separately reported)  Note by: Bettey Costa, NP (TTS and AI technology used. I apologize for any typographical errors that were not detected and corrected.) Date: 05/20/2023; Time: 10:38 AM

## 2023-05-26 LAB — TOXASSURE SELECT 13 (MW), URINE

## 2023-06-09 ENCOUNTER — Encounter: Attending: "Endocrinology | Admitting: Nutrition

## 2023-06-09 VITALS — Ht 64.0 in | Wt 153.0 lb

## 2023-06-09 DIAGNOSIS — Z794 Long term (current) use of insulin: Secondary | ICD-10-CM | POA: Insufficient documentation

## 2023-06-09 DIAGNOSIS — Z713 Dietary counseling and surveillance: Secondary | ICD-10-CM | POA: Insufficient documentation

## 2023-06-09 DIAGNOSIS — E119 Type 2 diabetes mellitus without complications: Secondary | ICD-10-CM | POA: Insufficient documentation

## 2023-06-09 DIAGNOSIS — E782 Mixed hyperlipidemia: Secondary | ICD-10-CM | POA: Diagnosis not present

## 2023-06-09 DIAGNOSIS — I10 Essential (primary) hypertension: Secondary | ICD-10-CM | POA: Diagnosis not present

## 2023-06-09 DIAGNOSIS — Z940 Kidney transplant status: Secondary | ICD-10-CM | POA: Diagnosis not present

## 2023-06-09 NOTE — Progress Notes (Signed)
 Medical Nutrition Therapy  Appointment Start time:  0800  Appointment End time:  0830  Primary concerns today: Dm Type 2, Kidney Transplant Referral diagnosis: E11.69 Preferred learning style: Visual and hands on (auditory, visual, hands on, no preference indicated) Learning readiness: Ready  NUTRITION ASSESSMENT  Forgot his meter. Still skips breakfast at times. Sometimes skips lunch. Has had some low blood sugar symptoms while mowing grass. Has candy and soda available. Admits he needs to do better with meal planning and timing of meal. Takes Jardiance. Tresiba  sometimes takes 20 units at night. Wont take it if he thinks his blood sugar is too low. Works second shift. Complains of his BS dropping too low. Glimepiride  2 mg a day Sleeping better, not urinating as much. Followup appt with CKD a few weeks Hasn't made follow up with Dr. [place] yet. Needs a PCP. Referred him to [facility]. Clinical Medical Hx: kidney transplant in 2007.  Medications: . Labs:  Lab Results  Component Value Date   HGBA1C 8.5 (A) 02/02/2023      Latest Ref Rng & Units 10/21/2018    8:48 PM 05/28/2017    8:53 AM 08/19/2016    7:16 PM  CMP  Glucose 70 - 99 mg/dL 161  096  045   BUN 6 - 20 mg/dL 22  17  18    Creatinine 0.61 - 1.24 mg/dL 4.09  8.11  9.14   Sodium 135 - 145 mmol/L 133  135  136   Potassium 3.5 - 5.1 mmol/L 4.0  3.9  4.2   Chloride 98 - 111 mmol/L 99  101  102   CO2 22 - 32 mmol/L 25  21  26    Calcium 8.9 - 10.3 mg/dL 9.4  9.5  9.3   Total Protein 6.5 - 8.1 g/dL  7.5  7.5   Total Bilirubin 0.3 - 1.2 mg/dL  1.1  0.9   Alkaline Phos 38 - 126 U/L  91  87   AST 15 - 41 U/L  22  14   ALT 17 - 63 U/L  18  12    Lipid Panel  No results found for: "CHOL", "TRIG", "HDL", "CHOLHDL", "VLDL", "LDLCALC", "LDLDIRECT", "LABVLDL"  Notable Signs/Symptoms: Increased thirst, frequent urination, fatigue, blurry vision.  Lifestyle & Dietary Hx Lives by himself. Has a girlfreind Works as a Clinical biochemist  part time  Estimated daily fluid intake: 40 oz Supplements:  Sleep: poor Stress / self-care: his health Current average weekly physical activity: ADL  24-Hr Dietary Recall Skipped breakfast [employer] Table 1230- Salmon cakes, pintos beans, green beans, hush puppies, water 7 pm Pork Chop Sandwich on white bread, water Yesterday 71 mg/dl This morning 782 mg/dl.- DIdn't take his insulin  last night.  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  Estimated Energy Needs Calories: 1800 Carbohydrate: 200g Protein: 135g Fat: 50g   NUTRITION DIAGNOSIS  NB-1.1 Food and nutrition-related knowledge deficit As related to Diabetes Type 2 and CKD.  As evidenced by A1C 8.4 and.eGFR 33   NUTRITION INTERVENTION  Nutrition education (E-1) on the following topics:  Nutrition and Diabetes education provided on My Plate, CHO counting, meal planning, portion sizes, timing of meals, avoiding snacks between meals unless having a low blood sugar, target ranges for A1C and blood sugars, signs/symptoms and treatment of hyper/hypoglycemia, monitoring blood sugars, taking medications as prescribed, benefits of exercising 30 minutes per day and prevention of complications of DM.  Lifestyle Medicine  -  Whole Food, Plant Predominant Nutrition is highly recommended: Eat Plenty of vegetables, Mushrooms, fruits, Legumes, Whole Grains, Nuts, seeds in lieu of processed meats, processed snacks/pastries red meat, poultry, eggs.    -It is better to avoid simple carbohydrates including: Cakes, Sweet Desserts, Ice Cream, Soda (diet and regular), Sweet Tea, Candies, Chips, Cookies, Store Bought Juices, Alcohol in Excess of  1-2 drinks a day, Lemonade,  Artificial Sweeteners, Doughnuts, Coffee Creamers, "Sugar-free" Products, etc, etc.  This is not a complete list.....  Exercise: If you are able: 30 -60 minutes a day ,4 days a week, or 150 minutes a week.  The longer the better.  Combine stretch,  strength, and aerobic activities.  If you were told in the past that you have high risk for cardiovascular diseases, you may seek evaluation by your heart doctor prior to initiating moderate to intense exercise programs.  Chronic kidney disease  Handouts Provided Include  Lifestyle Medicine Blood sugars log  Learning Style & Readiness for Change Teaching method utilized: Visual & Auditory  Demonstrated degree of understanding via: Teach Back  Barriers to learning/adherence to lifestyle change: commitment to being compliant to taking medications and diet restrictions  Goals Established by Pt Goals Goals Take 10 units of Tresiba  every day Eat 3 meals per day on time. DO NOT SKIP MEALS> Especially breakfast. B)7-8 am, L) 2-3 pm and Dinner 8 pm. Keep some fruit and water bottles in truck. Prevent low blood sugars Bring meter at all visits Call back the kidney doctor office today  MONITORING & EVALUATION Dietary intake, weekly physical activity, and blood sguars in 1 month.  Next Steps  Patient is to start taking medication as prescribed.Aaron Aas

## 2023-06-09 NOTE — Patient Instructions (Addendum)
 Goals Take 10 units of Tresiba  every day Eat 3 meals per day on time. DO NOT SKIP MEALS> Especially breakfast. B)7-8 am, L) 2-3 pm and Dinner 8 pm. Keep some fruit and water bottles in truck. Prevent low blood sugars Bring meter at all visits Call back the kidney doctor office today.

## 2023-06-28 ENCOUNTER — Encounter: Payer: Self-pay | Admitting: Nutrition

## 2023-06-29 DIAGNOSIS — N186 End stage renal disease: Secondary | ICD-10-CM | POA: Diagnosis not present

## 2023-06-29 DIAGNOSIS — I10 Essential (primary) hypertension: Secondary | ICD-10-CM | POA: Diagnosis not present

## 2023-06-29 DIAGNOSIS — Z940 Kidney transplant status: Secondary | ICD-10-CM | POA: Diagnosis not present

## 2023-06-29 LAB — LAB REPORT - SCANNED
A1c: 8.7
Calcium: 9.3
Creatinine, POC: 49 mg/dL
EGFR: 24
PTH: 277

## 2023-06-29 LAB — COMPREHENSIVE METABOLIC PANEL WITH GFR
Calcium: 9.3 (ref 8.7–10.7)
eGFR: 24

## 2023-06-29 LAB — HEMOGLOBIN A1C: Hemoglobin A1C: 8.7

## 2023-06-29 LAB — BASIC METABOLIC PANEL WITH GFR
BUN: 47 — AB (ref 4–21)
Creatinine: 2.9 — AB (ref 0.6–1.3)

## 2023-07-01 ENCOUNTER — Encounter: Payer: Self-pay | Admitting: Nutrition

## 2023-07-01 ENCOUNTER — Encounter: Attending: "Endocrinology | Admitting: Nutrition

## 2023-07-01 ENCOUNTER — Ambulatory Visit: Payer: BC Managed Care – PPO | Admitting: "Endocrinology

## 2023-07-01 ENCOUNTER — Encounter: Payer: Self-pay | Admitting: "Endocrinology

## 2023-07-01 VITALS — BP 138/76 | HR 76 | Ht 65.5 in

## 2023-07-01 DIAGNOSIS — E1122 Type 2 diabetes mellitus with diabetic chronic kidney disease: Secondary | ICD-10-CM

## 2023-07-01 DIAGNOSIS — Z713 Dietary counseling and surveillance: Secondary | ICD-10-CM | POA: Insufficient documentation

## 2023-07-01 DIAGNOSIS — E119 Type 2 diabetes mellitus without complications: Secondary | ICD-10-CM | POA: Insufficient documentation

## 2023-07-01 DIAGNOSIS — N1832 Chronic kidney disease, stage 3b: Secondary | ICD-10-CM

## 2023-07-01 DIAGNOSIS — Z940 Kidney transplant status: Secondary | ICD-10-CM | POA: Diagnosis not present

## 2023-07-01 DIAGNOSIS — Z794 Long term (current) use of insulin: Secondary | ICD-10-CM | POA: Insufficient documentation

## 2023-07-01 DIAGNOSIS — I10 Essential (primary) hypertension: Secondary | ICD-10-CM | POA: Diagnosis not present

## 2023-07-01 DIAGNOSIS — Z7984 Long term (current) use of oral hypoglycemic drugs: Secondary | ICD-10-CM

## 2023-07-01 DIAGNOSIS — E782 Mixed hyperlipidemia: Secondary | ICD-10-CM

## 2023-07-01 MED ORDER — TRESIBA FLEXTOUCH 100 UNIT/ML ~~LOC~~ SOPN: 20.0000 [IU] | PEN_INJECTOR | Freq: Every day | SUBCUTANEOUS | 1 refills | Status: DC

## 2023-07-01 NOTE — Patient Instructions (Addendum)
 Goals Eat breakfast daily Call transplant office to schedule follow up appt. Get A1C down to 7% Don't skip meals Work on meal planning and prepping.

## 2023-07-01 NOTE — Progress Notes (Signed)
 07/01/2023, 1:15 PM   Endocrinology follow-up note  Subjective:    Patient ID: [name], male    DOB: [birth year].  [name] is being seen in follow-up of he was seen in the consultation for management of currently uncontrolled symptomatic diabetes requested by  Pcp, No.   Past Medical History:  Diagnosis Date   DDD (degenerative disc disease), lumbar    Diabetes mellitus    Hypertension    Polycystic kidney disease     Past Surgical History:  Procedure Laterality Date   NEPHRECTOMY TRANSPLANTED ORGAN      Social History   Socioeconomic History   Marital status: Single    Spouse name: Not on file   Number of children: Not on file   Years of education: Not on file   Highest education level: Not on file  Occupational History   Not on file  Tobacco Use   Smoking status: Never   Smokeless tobacco: Never  Substance and Sexual Activity   Alcohol use: No    Comment: rarely   Drug use: No   Sexual activity: Not on file  Other Topics Concern   Not on file  Social History Narrative   Not on file   Social Drivers of Health   Financial Resource Strain: Not on file  Food Insecurity: Not on file  Transportation Needs: Not on file  Physical Activity: Not on file  Stress: Not on file  Social Connections: Not on file    Family History  Problem Relation Age of Onset   Diabetes Mother    Kidney disease Mother    Stroke Mother    Kidney disease Father    Hypertension Father    Stroke Father    Heart disease Father     Outpatient Encounter Medications as of 07/01/2023  Medication Sig   [DISCONTINUED] insulin  degludec (TRESIBA  FLEXTOUCH) 100 UNIT/ML FlexTouch Pen Inject 20 Units into the skin at bedtime. (Patient taking differently: Inject 12 Units into the skin at bedtime.)   amLODipine  (NORVASC ) 10 MG tablet Take 10 mg by mouth daily.   aspirin  EC 81 MG tablet Take 81 mg by  mouth daily.   carvedilol (COREG) 6.25 MG tablet Take 6.25 mg by mouth 2 (two) times daily.   elvitegravir-cobicistat-emtricitabine-tenofovir (GENVOYA) 150-150-200-10 MG TABS tablet Take 1 tablet by mouth daily with breakfast. (Patient not taking: Reported on 02/02/2023)   enalapril  (VASOTEC ) 5 MG tablet Take 20 mg by mouth daily.   glucose blood (TRUE METRIX BLOOD GLUCOSE TEST) test strip Use as instructed   insulin  degludec (TRESIBA  FLEXTOUCH) 100 UNIT/ML FlexTouch Pen Inject 20 Units into the skin at bedtime.   Insulin  Pen Needle (BD PEN NEEDLE NANO U/F) 32G X 4 MM MISC 1 each by Does not apply route 4 (four) times daily.   JARDIANCE 10 MG TABS tablet Take 10 mg by mouth daily.   mycophenolate  (CELLCEPT ) 250 MG capsule Take 1,000 mg by mouth 2 (two) times daily.   oxyCODONE  (OXY IR/ROXICODONE ) 5 MG immediate release tablet Take 1 tablet (5 mg total) by mouth every 6 (six) hours as needed for severe  pain (pain score 7-10).   [START ON 07/26/2023] oxyCODONE  (OXY IR/ROXICODONE ) 5 MG immediate release tablet Take 1 tablet (5 mg total) by mouth every 6 (six) hours as needed for severe pain (pain score 7-10).   predniSONE  (DELTASONE ) 5 MG tablet Take 5 mg by mouth daily.   simvastatin  (ZOCOR ) 20 MG tablet Take 20 mg by mouth daily. (Patient not taking: Reported on 02/02/2023)   tacrolimus  (PROGRAF ) 1 MG capsule Take 2 mg by mouth 2 (two) times daily.   tetrahydrozoline-zinc (VISINE-AC) 0.05-0.25 % ophthalmic solution Place 2 drops into both eyes 3 (three) times daily as needed. Dry/Red Eyes   [DISCONTINUED] Continuous Glucose Receiver (FREESTYLE LIBRE 3 READER) DEVI 1 Piece by Does not apply route once as needed for up to 1 dose. (Patient not taking: Reported on 07/01/2023)   [DISCONTINUED] Continuous Glucose Sensor (FREESTYLE LIBRE 3 PLUS SENSOR) MISC Change sensor every 15 days. (Patient not taking: Reported on 07/01/2023)   [DISCONTINUED] glimepiride  (AMARYL ) 2 MG tablet Take 1 tablet (2 mg total) by  mouth daily with breakfast.   No facility-administered encounter medications on file as of 07/01/2023.    ALLERGIES: No Known Allergies  VACCINATION STATUS:  There is no immunization history on file for this patient.  Diabetes He presents for his follow-up diabetic visit. Diabetes type: Renal transplant related diabetes. His disease course has been worsening. Hypoglycemia symptoms include headaches. Pertinent negatives for hypoglycemia include no confusion, pallor or seizures. Associated symptoms include polydipsia and polyuria. Pertinent negatives for diabetes include no chest pain, no fatigue, no polyphagia and no weakness. Symptoms are worsening. Diabetic complications include nephropathy. 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  He is currently on Jardiance 10 mg p.o. daily, and glimepiride  4 mg p.o. daily, metformin 1000 mg p.o. twice daily.  He is transplanted kidney is also failing with GFR of 33.  Patient was offered insulin  treatment at 1 point, however he is currently not taking any insulin .) Risk factors for coronary artery disease include dyslipidemia, family history, male sex and sedentary lifestyle. His weight is increasing steadily. He is following a generally unhealthy diet. When asked about meal planning, he reported none. He rarely participates in exercise. His home blood glucose trend is increasing steadily. His breakfast blood glucose range is generally 140-180 mg/dl. His bedtime blood glucose range is generally 180-200 mg/dl. His overall blood glucose range is 180-200 mg/dl. (Patient brought his meter showing average blood glucose between 177-213 for the last 30 days.  His previsit labs show A1c of 8.7%.  He did not document or report hypoglycemia.    ) An ACE inhibitor/angiotensin II receptor blocker is being taken.  Hypertension This is a chronic problem. The current episode started more than 1 year ago. The  problem is uncontrolled. Associated symptoms include headaches. Pertinent negatives include no chest pain, neck pain, palpitations or shortness of breath. Risk factors for coronary artery disease include diabetes mellitus, male gender and sedentary lifestyle. Past treatments include ACE inhibitors. Hypertensive end-organ damage includes kidney disease. Identifiable causes of hypertension include chronic renal disease.      Objective:       07/01/2023    9:58 AM 07/01/2023    9:10 AM 06/09/2023    8:05 AM  Vitals with BMI  Height 5' 5.5" 5\' 4"  5\' 4"   Weight  158 lbs 153 lbs  BMI  27.11 26.25  Systolic 138    Diastolic 76  Pulse 76      BP 138/76   Pulse 76   Ht 5' 5.5" (1.664 m)   BMI 25.89 kg/m   Wt Readings from Last 3 Encounters:  07/01/23 158 lb (71.7 kg)  06/09/23 153 lb (69.4 kg)  05/20/23 150 lb (68 kg)      CMP ( most recent) CMP     Component Value Date/Time   NA 133 (L) 10/21/2018 2048   K 4.0 10/21/2018 2048   CL 99 10/21/2018 2048   CO2 25 10/21/2018 2048   GLUCOSE 334 (H) 10/21/2018 2048   BUN 47 (A) 06/29/2023 0000   CREATININE 2.9 (A) 06/29/2023 0000   CREATININE 1.55 (H) 10/21/2018 2048   CALCIUM 9.3 06/29/2023 1016   PROT 7.5 05/28/2017 0853   ALBUMIN 3.7 05/28/2017 0853   AST 22 05/28/2017 0853   ALT 18 05/28/2017 0853   ALKPHOS 91 05/28/2017 0853   BILITOT 1.1 05/28/2017 0853   EGFR 24.0 06/29/2023 1016   EGFR 24 06/29/2023 0000   GFRNONAA 51 (L) 10/21/2018 2048     Diabetic Labs (most recent): Lab Results  Component Value Date   HGBA1C 8.7 06/29/2023   HGBA1C 8.5 (A) 02/02/2023   HGBA1C 9.1 (H) 11/14/2014     Assessment & Plan:   1. Type 2 diabetes mellitus with other specified complication, unspecified whether long term insulin  use (HCC) (Primary)  - [name] has currently uncontrolled symptomatic type 2 DM since  58 years of age.  Patient brought his meter showing average blood glucose between 177-213 for the last 30  days.  His previsit labs show A1c of 8.7%.  He did not document or report hypoglycemia.    Recent labs reviewed. 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  He reports that he was diagnosed with diabetes right after his transplant.   - I had a long discussion with him about the possible risk factors and  the pathology behind diabetes and its complications. -his diabetes is complicated by CKD and he remains at a high risk for more acute and chronic complications which include CAD, CVA, CKD, retinopathy, and neuropathy. These are all discussed in detail with him.  - I discussed all available options of managing his diabetes including de-escalation of medications. I have counseled him on Food as Medicine by adopting a Whole Food , Plant Predominant  ( WFPP) nutrition as recommended by [employer] of Lifestyle Medicine. Patient is encouraged to switch to  unprocessed or minimally processed  complex starch, adequate protein intake (mainly plant source), minimal liquid fat, plenty of fruits, and vegetables. -  he is advised to stick to a routine mealtimes to eat 3 complete meals a day and snack only when necessary (to snack only to correct hypoglycemia BG <70 day time or <100 at night).  - he acknowledges that there is a room for improvement in his food and drink choices.  - Further Specific Suggestion is made for him to avoid simple carbohydrates  from his diet including Cakes, Sweet Desserts, Ice Cream, Soda (diet and regular), Sweet Tea, Candies, Chips, Cookies, Store Bought Juices, Alcohol ,  Artificial Sweeteners,  Coffee Creamer, and "Sugar-free" Products. This will help patient to have more stable blood glucose profile and potentially avoid unintended weight gain.  - he will be scheduled with Penny Crumpton, RDN, CDE for individualized diabetes education.  - I have approached him with the following individualized plan to  manage  his  diabetes and patient agrees:   -He is not a candidate for metformin treatment.  - He is responding and a candidate for insulin  treatment.  I discussed and increase his Tresiba  to 20 units nightly, Jardiance will be continued cautiously at 10 mg p.o. daily at breakfast.  He is advised to discontinue glipizide and glimepiride .  He has been using these products from family members. -He is advised to monitor blood glucose at least twice a day-before breakfast and at bedtime until he gets his CGM device.  - he is encouraged to call clinic for blood glucose levels less than 70 or above 200 mg /dl.  - he will be considered for incretin therapy as appropriate next visit.  - Specific targets for  A1c;  LDL, HDL,  and Triglycerides were discussed with the patient.  2) Blood Pressure /Hypertension:  - His blood pressure is controlled to target. He is advised to continue enalapril  5 mg p.o. daily, amlodipine  10 mg p.o. daily,  Coreg 6.25 mg p.o. twice daily.  I did not adjust his blood pressure medications, defer to nephrology.  He is advised to be consistent on his current blood pressure medications.  3) Lipids/Hyperlipidemia: He does not have recent lipid panel to review.  He is advised to continue simvastatin  20 mg p.o. nightly.      Side effects and precautions discussed with him.  4)  Weight/Diet:  Body mass index is 25.89 kg/m.  -   he is not a candidate for major weight loss. I discussed with him the fact that loss of 5 - 10% of his  current body weight will have the most impact on his diabetes management.  The above detailed  ACLM recommendations for nutrition, exercise, sleep, social life, avoidance of risky substances, the need for restorative sleep  information is also detailed on discharge instructions.  5) Chronic Care/Health Maintenance:  -he  is on ACEI/ARB and Statin medications and  is encouraged to initiate and continue to follow up with Ophthalmology, Dentist,  Podiatrist at least  yearly or according to recommendations, and advised to   stay away from smoking. I have recommended yearly flu vaccine and pneumonia vaccine at least every 5 years; moderate intensity exercise for up to 150 minutes weekly; and  sleep for 7- 9 hours a day.  - he is  advised to maintain close follow up with Pcp, No for primary care needs, as well as his other providers for optimal and coordinated care.   I spent  26  minutes in the care of the patient today including review of labs from CMP, Lipids, Thyroid Function, Hematology (current and previous including abstractions from other facilities); face-to-face time discussing  his blood glucose readings/logs, discussing hypoglycemia and hyperglycemia episodes and symptoms, medications doses, his options of short and long term treatment based on the latest standards of care / guidelines;  discussion about incorporating lifestyle medicine;  and documenting the encounter. Risk reduction counseling performed per USPSTF guidelines to reduce  cardiovascular risk factors.     Please refer to Patient Instructions for Blood Glucose Monitoring and Insulin /Medications Dosing Guide"  in media tab for additional information. Please  also refer to " Patient Self Inventory" in the Media  tab for reviewed elements of pertinent patient history.  [name] participated in the discussions, expressed understanding, and voiced agreement with the above plans.  All questions were answered to his satisfaction. he is encouraged to contact clinic should he have any questions  or concerns prior to his return visit.   Follow up plan: - Return in about 3 months (around 10/01/2023) for Bring Meter/CGM Device/Logs- A1c in Office.  Kalvin Orf, MD [facility] [address] [place], [place] [address] Phone: [phone]  Fax: [phone]    07/01/2023, 1:15 PM  This note was partially dictated with voice recognition software.  Similar sounding words can be transcribed inadequately or may not  be corrected upon review.

## 2023-07-01 NOTE — Progress Notes (Signed)
 Medical Nutrition Therapy  Appointment Start time:  [phone]  Appointment End time:  0940Primary concerns today: Dm Type 2, Kidney Transplant Referral diagnosis: E11.69 Preferred learning style: Visual and hands on (auditory, visual, hands on, no preference indicated) Learning readiness: Ready  NUTRITION ASSESSMENT  DM Follow up. Here with his girlfriend. 8 Lbs weight gain  Feeling better. Sleeping better.  Changes made: has been working on eating better vegetables, fruit, drinking water. Still skipping breakfast most mornings. BS dropped this am was 86 and drank some soda because he didn't eat breakfast. Needs to schedulke transplant team appt. To see Dr. Monte Antonio today.   Glipizide, Jardiance, Tresiba  12 units at night. Scheduled to see Dr. Lydia Sams at [facility] on May 28th. Needs to schedule appointment with the transplant team follow up 7 Day avg 177 mg/dl, 30 day 960 mg/dl.  Clinical Medical Hx: kidney transplant in 2007.  Medications: . Labs: A1C 8.7.%    Lab Results  Component Value Date   HGBA1C 8.5 (A) 02/02/2023      Latest Ref Rng & Units 10/21/2018    8:48 PM 05/28/2017    8:53 AM 08/19/2016    7:16 PM  CMP  Glucose 70 - 99 mg/dL 454  098  119   BUN 6 - 20 mg/dL 22  17  18    Creatinine 0.61 - 1.24 mg/dL 1.47  8.29  5.62   Sodium 135 - 145 mmol/L 133  135  136   Potassium 3.5 - 5.1 mmol/L 4.0  3.9  4.2   Chloride 98 - 111 mmol/L 99  101  102   CO2 22 - 32 mmol/L 25  21  26    Calcium 8.9 - 10.3 mg/dL 9.4  9.5  9.3   Total Protein 6.5 - 8.1 g/dL  7.5  7.5   Total Bilirubin 0.3 - 1.2 mg/dL  1.1  0.9   Alkaline Phos 38 - 126 U/L  91  87   AST 15 - 41 U/L  22  14   ALT 17 - 63 U/L  18  12    Lipid Panel  No results found for: "CHOL", "TRIG", "HDL", "CHOLHDL", "VLDL", "LDLCALC", "LDLDIRECT", "LABVLDL"  Notable Signs/Symptoms: Increased thirst, frequent urination, fatigue, blurry vision.  Lifestyle & Dietary Hx Lives by himself. Has a girlfreind Works as a Clinical biochemist part  time  Estimated daily fluid intake: 40 oz Supplements:  Sleep: poor Stress / self-care: his health Current average weekly physical activity: ADL  24-Hr Dietary Recall Skipped breakfast [employer] Table 1230- Salmon cakes, pintos beans, green beans, hush puppies, water 7 pm Pork Chop Sandwich on white bread, water Yesterday 71 mg/dl This morning 130 mg/dl.- DIdn't take his insulin  last night.  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  Estimated Energy Needs Calories: 1800 Carbohydrate: 200g Protein: 135g Fat: 50g   NUTRITION DIAGNOSIS  NB-1.1 Food and nutrition-related knowledge deficit As related to Diabetes Type 2 and CKD.  As evidenced by A1C 8.4 and.eGFR 33   NUTRITION INTERVENTION  Nutrition education (E-1) on the following topics:  Nutrition and Diabetes education provided on My Plate, CHO counting, meal planning, portion sizes, timing of meals, avoiding snacks between meals unless having a low blood sugar, target ranges for A1C and blood sugars, signs/symptoms and treatment of hyper/hypoglycemia, monitoring blood sugars, taking medications as prescribed, benefits of exercising 30 minutes per day and prevention of complications of DM.  Lifestyle Medicine  - Whole Food, Plant Predominant  Nutrition is highly recommended: Eat Plenty of vegetables, Mushrooms, fruits, Legumes, Whole Grains, Nuts, seeds in lieu of processed meats, processed snacks/pastries red meat, poultry, eggs.    -It is better to avoid simple carbohydrates including: Cakes, Sweet Desserts, Ice Cream, Soda (diet and regular), Sweet Tea, Candies, Chips, Cookies, Store Bought Juices, Alcohol in Excess of  1-2 drinks a day, Lemonade,  Artificial Sweeteners, Doughnuts, Coffee Creamers, "Sugar-free" Products, etc, etc.  This is not a complete list.....  Exercise: If you are able: 30 -60 minutes a day ,4 days a week, or 150 minutes a week.  The longer the better.  Combine stretch,  strength, and aerobic activities.  If you were told in the past that you have high risk for cardiovascular diseases, you may seek evaluation by your heart doctor prior to initiating moderate to intense exercise programs.  Chronic kidney disease  Handouts Provided Include  Lifestyle Medicine Blood sugars log  Learning Style & Readiness for Change Teaching method utilized: Visual & Auditory  Demonstrated degree of understanding via: Teach Back  Barriers to learning/adherence to lifestyle change: commitment to being compliant to taking medications and diet restrictions  Goals Established by Pt Goals Eat breakfast daily Call transplant office to schedule follow up appt. Get A1C down to 7% Don't skip meals Work on meal planning and prepping.  MONITORING & EVALUATION Dietary intake, weekly physical activity, and blood sguars in 1 month.  Next Steps  Patient is to start taking medication as prescribed.Aaron Aas

## 2023-07-01 NOTE — Patient Instructions (Signed)

## 2023-07-14 ENCOUNTER — Encounter: Payer: Self-pay | Admitting: Internal Medicine

## 2023-07-14 ENCOUNTER — Ambulatory Visit: Payer: Self-pay | Admitting: Internal Medicine

## 2023-07-14 VITALS — BP 146/88 | HR 76 | Ht 65.5 in | Wt 157.0 lb

## 2023-07-14 DIAGNOSIS — Z794 Long term (current) use of insulin: Secondary | ICD-10-CM

## 2023-07-14 DIAGNOSIS — G894 Chronic pain syndrome: Secondary | ICD-10-CM

## 2023-07-14 DIAGNOSIS — Z1211 Encounter for screening for malignant neoplasm of colon: Secondary | ICD-10-CM

## 2023-07-14 DIAGNOSIS — D849 Immunodeficiency, unspecified: Secondary | ICD-10-CM

## 2023-07-14 DIAGNOSIS — E782 Mixed hyperlipidemia: Secondary | ICD-10-CM

## 2023-07-14 DIAGNOSIS — E1169 Type 2 diabetes mellitus with other specified complication: Secondary | ICD-10-CM

## 2023-07-14 DIAGNOSIS — E119 Type 2 diabetes mellitus without complications: Secondary | ICD-10-CM | POA: Diagnosis not present

## 2023-07-14 DIAGNOSIS — Z940 Kidney transplant status: Secondary | ICD-10-CM | POA: Diagnosis not present

## 2023-07-14 DIAGNOSIS — I10 Essential (primary) hypertension: Secondary | ICD-10-CM | POA: Diagnosis not present

## 2023-07-14 DIAGNOSIS — N186 End stage renal disease: Secondary | ICD-10-CM

## 2023-07-14 DIAGNOSIS — N433 Hydrocele, unspecified: Secondary | ICD-10-CM | POA: Insufficient documentation

## 2023-07-14 DIAGNOSIS — M51362 Other intervertebral disc degeneration, lumbar region with discogenic back pain and lower extremity pain: Secondary | ICD-10-CM | POA: Diagnosis not present

## 2023-07-14 DIAGNOSIS — N5089 Other specified disorders of the male genital organs: Secondary | ICD-10-CM

## 2023-07-14 NOTE — Assessment & Plan Note (Addendum)
 Lab Results  Component Value Date   HGBA1C 8.7 06/29/2023   Uncontrolled Associated with HTN, HLD, CKD On Tresiba  20 units nightly and Jardiance 10 mg QD, followed by endocrinology Advised to follow diabetic diet On statin and ACEi, needs to be compliant to statin F/u CMP and lipid panel Diabetic eye exam: Advised to follow up with Ophthalmology for diabetic eye exam

## 2023-07-14 NOTE — Assessment & Plan Note (Signed)
 On tacrolimus , CellCept  and oral prednisone  Followed by nephrology-Dr. Rhesa Celeste and transplant team at [facility]

## 2023-07-14 NOTE — Assessment & Plan Note (Signed)
 Discussed about colonoscopy and cologuard - benefits of each procedure discussed. Patient prefers Cologuard - ordered.

## 2023-07-14 NOTE — Assessment & Plan Note (Signed)
 On simvastatin  20 mg QD, compliance questionable

## 2023-07-14 NOTE — Assessment & Plan Note (Signed)
 Likely hydrocele, check US  of scrotum Referred to urology

## 2023-07-14 NOTE — Progress Notes (Signed)
 New Patient Office Visit  Subjective:  Patient ID: [name], male    DOB: [birth year]  Age: 58 y.o. MRN: [id number]  CC:  Chief Complaint  Patient presents with   Establish Care    New patient , establishing care. Previously was seen by Dr. Knowlton.     HPI [name] is a 58 y.o. male with past medical history of ESRD s/p renal transplant (2007), HTN, type II DM, HLD and lumbar DDD who presents for establishing care.  HTN: His BP was elevated today, as he has not taken his medicines.  He is supposed to take amlodipine  10 mg QD, Coreg 6.25 mg QD and enalapril  20 mg QD, followed by nephrology.  Denies any headache, dizziness, chest pain, dyspnea or palpitations.  Type II DM: His HbA1c was 8.7 in 05/25.  He also takes Tresiba  20 units nightly, followed by endocrinology.  He is on Jardiance 10 mg QD for proteinuria.  Denies polyuria or polyphagia currently.  ESRD s/p renal transplant: He has history of polycystic kidney disease, s/p nephrectomy.  He had renal transplant in 2007, currently followed by nephrology - Dr. Rhesa Celeste and transplant team at [facility].  He is on tacrolimus , mycophenolate  and oral prednisone .  Chronic pain syndrome: He has history of lumbar DDD.  He has completed PT, denies any history of spine surgery.  He has been on oxycodone  5 mg QID PRN for about 10 years.  He was getting pain medicines from his previous PCP, but later started seeing pain clinic at [facility] for it.  He requests pain management from us .  I had lengthy discussion with him about pain management and have agreed to prescribe it for now.  Denies alcohol abuse or any other illicit drug use currently.  Scrotal mass: He reports having enlarging mass of the scrotum on the right side for the last 6 months.  Denies any scrotal pain currently.  He has erectile dysfunction, but denies dysuria, hematuria or urinary hesitancy or resistance.    Past Medical History:  Diagnosis Date   DDD  (degenerative disc disease), lumbar    Diabetes mellitus    Hypertension    Polycystic kidney disease     Past Surgical History:  Procedure Laterality Date   NEPHRECTOMY TRANSPLANTED ORGAN      Family History  Problem Relation Age of Onset   Diabetes Mother    Kidney disease Mother    Stroke Mother    Kidney disease Father    Hypertension Father    Stroke Father    Heart disease Father     Social History   Socioeconomic History   Marital status: Single    Spouse name: Not on file   Number of children: Not on file   Years of education: Not on file   Highest education level: Not on file  Occupational History   Not on file  Tobacco Use   Smoking status: Never   Smokeless tobacco: Never  Substance and Sexual Activity   Alcohol use: No    Comment: rarely   Drug use: No   Sexual activity: Not Currently  Other Topics Concern   Not on file  Social History Narrative   Not on file   Social Drivers of Health   Financial Resource Strain: Not on file  Food Insecurity: Not on file  Transportation Needs: Not on file  Physical Activity: Not on file  Stress: Not on file  Social Connections: Not on file  Intimate Partner Violence:  Not on file    ROS Review of Systems  Constitutional:  Negative for chills and fever.  HENT:  Negative for congestion and sore throat.   Eyes:  Negative for pain and discharge.  Respiratory:  Negative for cough and shortness of breath.   Cardiovascular:  Negative for chest pain and palpitations.  Gastrointestinal:  Negative for diarrhea, nausea and vomiting.  Endocrine: Negative for polydipsia and polyuria.  Genitourinary:  Positive for scrotal swelling. Negative for dysuria and hematuria.  Musculoskeletal:  Positive for back pain. Negative for neck pain and neck stiffness.  Skin:  Negative for rash.  Neurological:  Negative for dizziness and weakness.  Psychiatric/Behavioral:  Negative for agitation and behavioral problems.      Objective:   Today's Vitals: BP (!) 146/88 (BP Location: Left Arm)   Pulse 76   Ht 5' 5.5" (1.664 m)   Wt 157 lb (71.2 kg)   SpO2 98%   BMI 25.73 kg/m   Physical Exam Vitals reviewed.  Constitutional:      General: He is not in acute distress.    Appearance: He is not diaphoretic.  HENT:     Head: Normocephalic and atraumatic.     Nose: Nose normal.     Mouth/Throat:     Mouth: Mucous membranes are moist.  Eyes:     General: No scleral icterus.    Extraocular Movements: Extraocular movements intact.  Cardiovascular:     Rate and Rhythm: Normal rate and regular rhythm.     Heart sounds: Normal heart sounds. No murmur heard. Pulmonary:     Breath sounds: Normal breath sounds. No wheezing or rales.  Abdominal:     Palpations: Abdomen is soft.     Tenderness: There is no abdominal tenderness.  Genitourinary:    Testes:        Right: Mass present.  Musculoskeletal:     Cervical back: Neck supple. No tenderness.     Right lower leg: No edema.     Left lower leg: No edema.  Skin:    General: Skin is warm.     Findings: No rash.  Neurological:     General: No focal deficit present.     Mental Status: He is alert and oriented to person, place, and time.  Psychiatric:        Mood and Affect: Mood normal.        Behavior: Behavior normal.     Assessment & Plan:   Problem List Items Addressed This Visit       Cardiovascular and Mediastinum   Essential hypertension, benign   BP Readings from Last 1 Encounters:  07/14/23 (!) 142/90   Uncontrolled as he has not taken his medicines today Advised to take amlodipine  10 mg QD, Coreg 6.25 mg BID and enalapril  20 mg QD regularly Counseled for compliance with the medications Advised DASH diet and moderate exercise/walking, at least 150 mins/week       Relevant Medications   simvastatin  (ZOCOR ) 20 MG tablet     Endocrine   Type 2 diabetes mellitus with other specified complication (HCC) - Primary   Lab Results   Component Value Date   HGBA1C 8.7 06/29/2023   Uncontrolled Associated with HTN, HLD, CKD On Tresiba  20 units nightly and Jardiance 10 mg QD, followed by endocrinology Advised to follow diabetic diet On statin and ACEi, needs to be compliant to statin F/u CMP and lipid panel Diabetic eye exam: Advised to follow up with Ophthalmology for diabetic eye  exam      Relevant Medications   simvastatin  (ZOCOR ) 20 MG tablet   Other Relevant Orders   Microalbumin / creatinine urine ratio     Musculoskeletal and Integument   DDD (degenerative disc disease), lumbar   Has chronic low back pain Unable to take oral NSAIDs On oxycodone  5 mg 4 times daily as needed for chronic low back pain Avoid heavy lifting and frequent bending Heating pad and/or back brace as needed Has completed PT        Genitourinary   ESRD (end stage renal disease) (HCC)   Due to polycystic kidney disease and type II DM, s/p renal transplant in 2007 On enalapril  and Jardiance for proteinuria Followed by nephrology        Other   History of renal transplant   On tacrolimus , CellCept  and oral prednisone  Followed by  transplant team at [facility]      Mixed hyperlipidemia   On simvastatin  20 mg QD, compliance questionable      Relevant Medications   simvastatin  (ZOCOR ) 20 MG tablet   Chronic pain syndrome   Due to lumbar DDD Unable to take oral NSAIDs due to ESRD On oxycodone  5 mg 4 times daily as needed - will manage chronic pain medicine for now, if he has uncontrolled pain, will refer to pain management again      Immunosuppressed status (HCC)   On tacrolimus , CellCept  and oral prednisone  Followed by nephrology-Dr. Rhesa Celeste and transplant team at [facility]      Colon cancer screening   Discussed about colonoscopy and cologuard - benefits of each procedure discussed. Patient prefers Cologuard - ordered.      Relevant Orders   Cologuard   Scrotal mass   Likely hydrocele, check US  of  scrotum Referred to urology      Relevant Orders   US  Scrotum   Ambulatory referral to Urology    Outpatient Encounter Medications as of 07/14/2023  Medication Sig   amLODipine  (NORVASC ) 10 MG tablet Take 10 mg by mouth daily.   aspirin  EC 81 MG tablet Take 81 mg by mouth daily.   carvedilol (COREG) 6.25 MG tablet Take 6.25 mg by mouth 2 (two) times daily.   enalapril  (VASOTEC ) 5 MG tablet Take 20 mg by mouth daily.   glucose blood (TRUE METRIX BLOOD GLUCOSE TEST) test strip Use as instructed   insulin  degludec (TRESIBA  FLEXTOUCH) 100 UNIT/ML FlexTouch Pen Inject 20 Units into the skin at bedtime.   Insulin  Pen Needle (BD PEN NEEDLE NANO U/F) 32G X 4 MM MISC 1 each by Does not apply route 4 (four) times daily.   JARDIANCE 10 MG TABS tablet Take 10 mg by mouth daily.   mycophenolate  (CELLCEPT ) 250 MG capsule Take 1,000 mg by mouth 2 (two) times daily.   oxyCODONE  (OXY IR/ROXICODONE ) 5 MG immediate release tablet Take 1 tablet (5 mg total) by mouth every 6 (six) hours as needed for severe pain (pain score 7-10).   predniSONE  (DELTASONE ) 5 MG tablet Take 5 mg by mouth daily.   simvastatin  (ZOCOR ) 20 MG tablet Take 20 mg by mouth daily.   tacrolimus  (PROGRAF ) 1 MG capsule Take 2 mg by mouth 2 (two) times daily.   tetrahydrozoline-zinc (VISINE-AC) 0.05-0.25 % ophthalmic solution Place 2 drops into both eyes 3 (three) times daily as needed. Dry/Red Eyes   [DISCONTINUED] elvitegravir-cobicistat-emtricitabine-tenofovir (GENVOYA) 150-150-200-10 MG TABS tablet Take 1 tablet by mouth daily with breakfast. (Patient not taking: Reported on 07/14/2023)   [DISCONTINUED]  oxyCODONE  (OXY IR/ROXICODONE ) 5 MG immediate release tablet Take 1 tablet (5 mg total) by mouth every 6 (six) hours as needed for severe pain (pain score 7-10).   [DISCONTINUED] simvastatin  (ZOCOR ) 20 MG tablet Take 20 mg by mouth daily. (Patient not taking: Reported on 07/14/2023)   No facility-administered encounter medications on file  as of 07/14/2023.    Follow-up: Return in about 2 months (around 09/13/2023) for HTN and DM.   Meldon Sport, MD

## 2023-07-14 NOTE — Assessment & Plan Note (Signed)
 Due to lumbar DDD Unable to take oral NSAIDs due to ESRD On oxycodone  5 mg 4 times daily as needed - will manage chronic pain medicine for now, if he has uncontrolled pain, will refer to pain management again

## 2023-07-14 NOTE — Assessment & Plan Note (Signed)
 On tacrolimus , CellCept  and oral prednisone  Followed by  transplant team at [facility]

## 2023-07-14 NOTE — Addendum Note (Signed)
 Addended byCleola Dach on: 07/14/2023 12:07 PM   Modules accepted: Level of Service

## 2023-07-14 NOTE — Assessment & Plan Note (Signed)
 Has chronic low back pain Unable to take oral NSAIDs On oxycodone  5 mg 4 times daily as needed for chronic low back pain Avoid heavy lifting and frequent bending Heating pad and/or back brace as needed Has completed PT

## 2023-07-14 NOTE — Patient Instructions (Signed)
 Please continue to take medications as prescribed.  Please continue to follow low carb diet and perform moderate exercise/walking at least 150 mins/week.

## 2023-07-14 NOTE — Assessment & Plan Note (Addendum)
 Due to polycystic kidney disease and type II DM, s/p renal transplant in 2007 On enalapril  and Jardiance for proteinuria Followed by nephrology

## 2023-07-14 NOTE — Assessment & Plan Note (Signed)
 BP Readings from Last 1 Encounters:  07/14/23 (!) 142/90   Uncontrolled as he has not taken his medicines today Advised to take amlodipine  10 mg QD, Coreg 6.25 mg BID and enalapril  20 mg QD regularly Counseled for compliance with the medications Advised DASH diet and moderate exercise/walking, at least 150 mins/week

## 2023-07-16 LAB — MICROALBUMIN / CREATININE URINE RATIO
Creatinine, Urine: 71.8 mg/dL
Microalb/Creat Ratio: 1952 mg/g{creat} — ABNORMAL HIGH (ref 0–29)
Microalbumin, Urine: 1401.5 ug/mL

## 2023-07-25 NOTE — Addendum Note (Signed)
 Addended byCleola Dach on: 07/25/2023 10:01 PM   Modules accepted: Orders

## 2023-08-09 ENCOUNTER — Ambulatory Visit (HOSPITAL_COMMUNITY)
Admission: RE | Admit: 2023-08-09 | Discharge: 2023-08-09 | Disposition: A | Discharge status: Home / Self Care | Source: Ambulatory Visit | Attending: Internal Medicine | Admitting: Internal Medicine

## 2023-08-09 DIAGNOSIS — N433 Hydrocele, unspecified: Secondary | ICD-10-CM | POA: Diagnosis not present

## 2023-08-09 DIAGNOSIS — N5089 Other specified disorders of the male genital organs: Secondary | ICD-10-CM | POA: Insufficient documentation

## 2023-08-10 DIAGNOSIS — Z1211 Encounter for screening for malignant neoplasm of colon: Secondary | ICD-10-CM | POA: Diagnosis not present

## 2023-08-12 ENCOUNTER — Other Ambulatory Visit: Payer: Self-pay

## 2023-08-12 ENCOUNTER — Emergency Department (HOSPITAL_COMMUNITY)

## 2023-08-12 ENCOUNTER — Inpatient Hospital Stay (HOSPITAL_COMMUNITY)

## 2023-08-12 ENCOUNTER — Inpatient Hospital Stay (HOSPITAL_COMMUNITY)
Admission: EM | Admit: 2023-08-12 | Discharge: 2023-08-14 | DRG: 193 | Disposition: A | Discharge status: Home / Self Care | Attending: Internal Medicine | Admitting: Internal Medicine

## 2023-08-12 ENCOUNTER — Ambulatory Visit (HOSPITAL_BASED_OUTPATIENT_CLINIC_OR_DEPARTMENT_OTHER): Payer: Self-pay | Admitting: Nurse Practitioner

## 2023-08-12 ENCOUNTER — Encounter (HOSPITAL_COMMUNITY): Payer: Self-pay | Admitting: Internal Medicine

## 2023-08-12 ENCOUNTER — Other Ambulatory Visit (HOSPITAL_COMMUNITY): Payer: Self-pay | Admitting: *Deleted

## 2023-08-12 DIAGNOSIS — D509 Iron deficiency anemia, unspecified: Secondary | ICD-10-CM | POA: Diagnosis not present

## 2023-08-12 DIAGNOSIS — N5089 Other specified disorders of the male genital organs: Secondary | ICD-10-CM | POA: Diagnosis present

## 2023-08-12 DIAGNOSIS — M51369 Other intervertebral disc degeneration, lumbar region without mention of lumbar back pain or lower extremity pain: Secondary | ICD-10-CM | POA: Diagnosis present

## 2023-08-12 DIAGNOSIS — R0602 Shortness of breath: Secondary | ICD-10-CM | POA: Diagnosis not present

## 2023-08-12 DIAGNOSIS — Z940 Kidney transplant status: Secondary | ICD-10-CM | POA: Diagnosis not present

## 2023-08-12 DIAGNOSIS — E1169 Type 2 diabetes mellitus with other specified complication: Secondary | ICD-10-CM | POA: Diagnosis present

## 2023-08-12 DIAGNOSIS — Z79624 Long term (current) use of inhibitors of nucleotide synthesis: Secondary | ICD-10-CM | POA: Diagnosis not present

## 2023-08-12 DIAGNOSIS — Z7982 Long term (current) use of aspirin: Secondary | ICD-10-CM

## 2023-08-12 DIAGNOSIS — J189 Pneumonia, unspecified organism: Secondary | ICD-10-CM | POA: Diagnosis not present

## 2023-08-12 DIAGNOSIS — Z841 Family history of disorders of kidney and ureter: Secondary | ICD-10-CM | POA: Diagnosis not present

## 2023-08-12 DIAGNOSIS — I10 Essential (primary) hypertension: Secondary | ICD-10-CM | POA: Diagnosis not present

## 2023-08-12 DIAGNOSIS — Z794 Long term (current) use of insulin: Secondary | ICD-10-CM

## 2023-08-12 DIAGNOSIS — I5021 Acute systolic (congestive) heart failure: Secondary | ICD-10-CM | POA: Diagnosis not present

## 2023-08-12 DIAGNOSIS — Z823 Family history of stroke: Secondary | ICD-10-CM | POA: Diagnosis not present

## 2023-08-12 DIAGNOSIS — D631 Anemia in chronic kidney disease: Secondary | ICD-10-CM | POA: Diagnosis not present

## 2023-08-12 DIAGNOSIS — Z79621 Long term (current) use of calcineurin inhibitor: Secondary | ICD-10-CM

## 2023-08-12 DIAGNOSIS — Z91199 Patient's noncompliance with other medical treatment and regimen due to unspecified reason: Secondary | ICD-10-CM

## 2023-08-12 DIAGNOSIS — R0609 Other forms of dyspnea: Secondary | ICD-10-CM | POA: Diagnosis not present

## 2023-08-12 DIAGNOSIS — E1122 Type 2 diabetes mellitus with diabetic chronic kidney disease: Secondary | ICD-10-CM | POA: Diagnosis present

## 2023-08-12 DIAGNOSIS — Z8249 Family history of ischemic heart disease and other diseases of the circulatory system: Secondary | ICD-10-CM | POA: Diagnosis not present

## 2023-08-12 DIAGNOSIS — Z7984 Long term (current) use of oral hypoglycemic drugs: Secondary | ICD-10-CM

## 2023-08-12 DIAGNOSIS — R918 Other nonspecific abnormal finding of lung field: Secondary | ICD-10-CM | POA: Diagnosis not present

## 2023-08-12 DIAGNOSIS — N1832 Chronic kidney disease, stage 3b: Secondary | ICD-10-CM | POA: Diagnosis present

## 2023-08-12 DIAGNOSIS — G894 Chronic pain syndrome: Secondary | ICD-10-CM

## 2023-08-12 DIAGNOSIS — I130 Hypertensive heart and chronic kidney disease with heart failure and stage 1 through stage 4 chronic kidney disease, or unspecified chronic kidney disease: Secondary | ICD-10-CM | POA: Diagnosis present

## 2023-08-12 DIAGNOSIS — Z79899 Other long term (current) drug therapy: Secondary | ICD-10-CM | POA: Diagnosis not present

## 2023-08-12 DIAGNOSIS — Z833 Family history of diabetes mellitus: Secondary | ICD-10-CM | POA: Diagnosis not present

## 2023-08-12 DIAGNOSIS — N433 Hydrocele, unspecified: Secondary | ICD-10-CM | POA: Diagnosis present

## 2023-08-12 LAB — COMPREHENSIVE METABOLIC PANEL WITH GFR
ALT: 12 U/L (ref 0–44)
AST: 19 U/L (ref 15–41)
Albumin: 3.1 g/dL — ABNORMAL LOW (ref 3.5–5.0)
Alkaline Phosphatase: 77 U/L (ref 38–126)
Anion gap: 7 (ref 5–15)
BUN: 33 mg/dL — ABNORMAL HIGH (ref 6–20)
CO2: 22 mmol/L (ref 22–32)
Calcium: 8.6 mg/dL — ABNORMAL LOW (ref 8.9–10.3)
Chloride: 111 mmol/L (ref 98–111)
Creatinine, Ser: 2.39 mg/dL — ABNORMAL HIGH (ref 0.61–1.24)
GFR, Estimated: 31 mL/min — ABNORMAL LOW (ref >60)
Glucose, Bld: 95 mg/dL (ref 70–99)
Potassium: 4.5 mmol/L (ref 3.5–5.1)
Sodium: 140 mmol/L (ref 135–145)
Total Bilirubin: 0.3 mg/dL (ref 0.0–1.2)
Total Protein: 6.8 g/dL (ref 6.5–8.1)

## 2023-08-12 LAB — IRON AND TIBC
Iron: 34 ug/dL — ABNORMAL LOW (ref 45–182)
Saturation Ratios: 14 % — ABNORMAL LOW (ref 17.9–39.5)
TIBC: 246 ug/dL — ABNORMAL LOW (ref 250–450)
UIBC: 212 ug/dL

## 2023-08-12 LAB — TROPONIN I (HIGH SENSITIVITY)
Troponin I (High Sensitivity): 21 ng/L — ABNORMAL HIGH (ref <18)
Troponin I (High Sensitivity): 20 ng/L — ABNORMAL HIGH (ref <18)

## 2023-08-12 LAB — BRAIN NATRIURETIC PEPTIDE: B Natriuretic Peptide: 306 pg/mL — ABNORMAL HIGH (ref 0.0–100.0)

## 2023-08-12 LAB — CBC
HCT: 38.5 % — ABNORMAL LOW (ref 39.0–52.0)
Hemoglobin: 11.9 g/dL — ABNORMAL LOW (ref 13.0–17.0)
MCH: 27.2 pg (ref 26.0–34.0)
MCHC: 30.9 g/dL (ref 30.0–36.0)
MCV: 88.1 fL (ref 80.0–100.0)
Platelets: 171 10*3/uL (ref 150–400)
RBC: 4.37 MIL/uL (ref 4.22–5.81)
RDW: 13.8 % (ref 11.5–15.5)
WBC: 6.3 10*3/uL (ref 4.0–10.5)
nRBC: 0 % (ref 0.0–0.2)

## 2023-08-12 LAB — ECHOCARDIOGRAM COMPLETE
Area-P 1/2: 4.49 cm2
Height: 65.5 in
S' Lateral: 4.4 cm
Weight: 2511.48 [oz_av]

## 2023-08-12 LAB — GLUCOSE, CAPILLARY
Glucose-Capillary: 101 mg/dL — ABNORMAL HIGH (ref 70–99)
Glucose-Capillary: 287 mg/dL — ABNORMAL HIGH (ref 70–99)

## 2023-08-12 LAB — RETICULOCYTES
Immature Retic Fract: 10.6 % (ref 2.3–15.9)
RBC.: 4.65 MIL/uL (ref 4.22–5.81)
Retic Count, Absolute: 89.7 10*3/uL (ref 19.0–186.0)
Retic Ct Pct: 1.9 % (ref 0.4–3.1)

## 2023-08-12 LAB — FOLATE: Folate: 10.2 ng/mL (ref >5.9)

## 2023-08-12 LAB — CBG MONITORING, ED
Glucose-Capillary: 106 mg/dL — ABNORMAL HIGH (ref 70–99)
Glucose-Capillary: 201 mg/dL — ABNORMAL HIGH (ref 70–99)

## 2023-08-12 LAB — FERRITIN: Ferritin: 117 ng/mL (ref 24–336)

## 2023-08-12 LAB — VITAMIN B12: Vitamin B-12: 222 pg/mL (ref 180–914)

## 2023-08-12 MED ORDER — MYCOPHENOLATE MOFETIL 250 MG PO CAPS
1000.0000 mg | ORAL_CAPSULE | Freq: Two times a day (BID) | ORAL | Status: DC
  Administered 2023-08-12 – 2023-08-13 (×4): 1000 mg via ORAL
  Filled 2023-08-12 (×6): qty 4

## 2023-08-12 MED ORDER — ENALAPRIL MALEATE 10 MG PO TABS
20.0000 mg | ORAL_TABLET | Freq: Every day | ORAL | Status: DC
  Administered 2023-08-12: 20 mg via ORAL
  Filled 2023-08-12 (×2): qty 2

## 2023-08-12 MED ORDER — PREDNISONE 10 MG PO TABS
5.0000 mg | ORAL_TABLET | Freq: Every day | ORAL | Status: DC
  Administered 2023-08-12 – 2023-08-14 (×3): 5 mg via ORAL
  Filled 2023-08-12 (×3): qty 1

## 2023-08-12 MED ORDER — OXYCODONE HCL 5 MG PO TABS: 5.0000 mg | ORAL_TABLET | Freq: Four times a day (QID) | ORAL | Status: DC | PRN

## 2023-08-12 MED ORDER — SODIUM CHLORIDE 0.9 % IV SOLN
500.0000 mg | INTRAVENOUS | Status: DC
  Administered 2023-08-12 – 2023-08-14 (×3): 500 mg via INTRAVENOUS
  Filled 2023-08-12 (×3): qty 5

## 2023-08-12 MED ORDER — ONDANSETRON HCL 4 MG/2ML IJ SOLN
4.0000 mg | Freq: Four times a day (QID) | INTRAMUSCULAR | Status: DC | PRN
Start: 2023-08-12 — End: 2023-08-14

## 2023-08-12 MED ORDER — ACETAMINOPHEN 325 MG PO TABS: 650.0000 mg | ORAL_TABLET | Freq: Four times a day (QID) | ORAL | Status: DC | PRN

## 2023-08-12 MED ORDER — EMPAGLIFLOZIN 10 MG PO TABS
10.0000 mg | ORAL_TABLET | Freq: Every day | ORAL | Status: DC
  Administered 2023-08-12 – 2023-08-14 (×3): 10 mg via ORAL
  Filled 2023-08-12 (×4): qty 1

## 2023-08-12 MED ORDER — HEPARIN SODIUM (PORCINE) 5000 UNIT/ML IJ SOLN
5000.0000 [IU] | Freq: Three times a day (TID) | INTRAMUSCULAR | Status: DC
  Administered 2023-08-12 – 2023-08-14 (×6): 5000 [IU] via SUBCUTANEOUS
  Filled 2023-08-12 (×6): qty 1

## 2023-08-12 MED ORDER — SIMVASTATIN 20 MG PO TABS
20.0000 mg | ORAL_TABLET | Freq: Every day | ORAL | Status: DC
  Administered 2023-08-12 – 2023-08-14 (×3): 20 mg via ORAL
  Filled 2023-08-12: qty 1
  Filled 2023-08-12: qty 2
  Filled 2023-08-12: qty 1

## 2023-08-12 MED ORDER — AMLODIPINE BESYLATE 5 MG PO TABS
10.0000 mg | ORAL_TABLET | Freq: Every day | ORAL | Status: DC
  Administered 2023-08-12 – 2023-08-14 (×3): 10 mg via ORAL
  Filled 2023-08-12 (×3): qty 2

## 2023-08-12 MED ORDER — CARVEDILOL 3.125 MG PO TABS
6.2500 mg | ORAL_TABLET | Freq: Two times a day (BID) | ORAL | Status: DC
  Administered 2023-08-12 – 2023-08-14 (×5): 6.25 mg via ORAL
  Filled 2023-08-12 (×5): qty 2

## 2023-08-12 MED ORDER — ASPIRIN 81 MG PO TBEC
81.0000 mg | DELAYED_RELEASE_TABLET | Freq: Every day | ORAL | Status: DC
  Administered 2023-08-12 – 2023-08-14 (×3): 81 mg via ORAL
  Filled 2023-08-12 (×3): qty 1

## 2023-08-12 MED ORDER — ONDANSETRON HCL 4 MG PO TABS: 4.0000 mg | ORAL_TABLET | Freq: Four times a day (QID) | ORAL | Status: DC | PRN

## 2023-08-12 MED ORDER — TACROLIMUS 0.5 MG PO CAPS
2.0000 mg | ORAL_CAPSULE | Freq: Two times a day (BID) | ORAL | Status: DC
  Administered 2023-08-12 – 2023-08-14 (×5): 2 mg via ORAL
  Filled 2023-08-12 (×3): qty 4
  Filled 2023-08-12: qty 2
  Filled 2023-08-12: qty 4

## 2023-08-12 MED ORDER — INSULIN ASPART 100 UNIT/ML IJ SOLN
0.0000 [IU] | Freq: Every day | INTRAMUSCULAR | Status: DC
  Administered 2023-08-12: 3 [IU] via SUBCUTANEOUS
  Administered 2023-08-13: 5 [IU] via SUBCUTANEOUS

## 2023-08-12 MED ORDER — INSULIN ASPART 100 UNIT/ML IJ SOLN
0.0000 [IU] | Freq: Three times a day (TID) | INTRAMUSCULAR | Status: DC
  Administered 2023-08-12: 3 [IU] via SUBCUTANEOUS
  Administered 2023-08-13: 2 [IU] via SUBCUTANEOUS
  Administered 2023-08-13: 1 [IU] via SUBCUTANEOUS
  Filled 2023-08-12: qty 1

## 2023-08-12 MED ORDER — IPRATROPIUM-ALBUTEROL 0.5-2.5 (3) MG/3ML IN SOLN
3.0000 mL | Freq: Once | RESPIRATORY_TRACT | Status: AC
  Administered 2023-08-12: 3 mL via RESPIRATORY_TRACT
  Filled 2023-08-12: qty 3

## 2023-08-12 MED ORDER — SODIUM CHLORIDE 0.9 % IV SOLN
2.0000 g | INTRAVENOUS | Status: DC
  Administered 2023-08-13 – 2023-08-14 (×2): 2 g via INTRAVENOUS
  Filled 2023-08-12 (×2): qty 20

## 2023-08-12 MED ORDER — NAPHAZOLINE-GLYCERIN 0.012-0.25 % OP SOLN
2.0000 [drp] | Freq: Four times a day (QID) | OPHTHALMIC | Status: DC | PRN
  Filled 2023-08-12: qty 15

## 2023-08-12 MED ORDER — ACETAMINOPHEN 650 MG RE SUPP: 650.0000 mg | Freq: Four times a day (QID) | RECTAL | Status: DC | PRN

## 2023-08-12 MED ORDER — SODIUM CHLORIDE 0.9 % IV SOLN
2.0000 g | Freq: Once | INTRAVENOUS | Status: AC
  Administered 2023-08-12: 2 g via INTRAVENOUS
  Filled 2023-08-12: qty 20

## 2023-08-12 MED ORDER — GUAIFENESIN-DM 100-10 MG/5ML PO SYRP
5.0000 mL | ORAL_SOLUTION | ORAL | Status: DC | PRN
  Administered 2023-08-12: 5 mL via ORAL
  Filled 2023-08-12: qty 5

## 2023-08-12 MED ORDER — INSULIN GLARGINE-YFGN 100 UNIT/ML ~~LOC~~ SOLN
20.0000 [IU] | Freq: Every day | SUBCUTANEOUS | Status: DC
  Administered 2023-08-12 – 2023-08-13 (×2): 20 [IU] via SUBCUTANEOUS
  Filled 2023-08-12 (×3): qty 0.2

## 2023-08-12 NOTE — H&P (Signed)
 History and Physical    Kalid Ghan FMW:[id number] DOB: April 24, 1965 DOA: 08/12/2023  PCP: Tobie Suzzane POUR, MD   Patient coming from: Home  Chief Complaint: Shortness of breath  HPI: [name] is a 58 y.o. male with medical history significant for hypertension, diabetes, and prior renal transplant who presented to the ED with complaints of worsening shortness of breath over the last 2 weeks.  This has been associated with increasing cough over the last 2 days which is especially worse at night when laying flat.  He denies any fevers or chills and states that he does not have much chest pain.   ED Course: Vital signs with elevated blood pressures noted and hemoglobin 11.9.  BNP elevated over 300.  Patient was started on Rocephin  and azithromycin  based on findings of multifocal pneumonia on chest x-ray.  Review of Systems: Reviewed as noted above, otherwise negative.  Past Medical History:  Diagnosis Date   DDD (degenerative disc disease), lumbar    Diabetes mellitus    Hypertension    Polycystic kidney disease     Past Surgical History:  Procedure Laterality Date   NEPHRECTOMY TRANSPLANTED ORGAN       reports that he has never smoked. He has never used smokeless tobacco. He reports that he does not drink alcohol and does not use drugs.  No Known Allergies  Family History  Problem Relation Age of Onset   Diabetes Mother    Kidney disease Mother    Stroke Mother    Kidney disease Father    Hypertension Father    Stroke Father    Heart disease Father     Prior to Admission medications   Medication Sig Start Date End Date Taking? Authorizing Provider  amLODipine  (NORVASC ) 10 MG tablet Take 10 mg by mouth daily.    [provider]  aspirin  EC 81 MG tablet Take 81 mg by mouth daily.    [provider]  carvedilol (COREG) 6.25 MG tablet Take 6.25 mg by mouth 2 (two) times daily.    [provider]  enalapril  (VASOTEC ) 5 MG tablet Take 20 mg by  mouth daily.    [provider]  glucose blood (TRUE METRIX BLOOD GLUCOSE TEST) test strip Use as instructed 02/02/23   Nida, Gebreselassie W, MD  insulin  degludec (TRESIBA  FLEXTOUCH) 100 UNIT/ML FlexTouch Pen Inject 20 Units into the skin at bedtime. 07/01/23   Nida, Gebreselassie W, MD  Insulin  Pen Needle (BD PEN NEEDLE NANO U/F) 32G X 4 MM MISC 1 each by Does not apply route 4 (four) times daily. 03/03/23   Nida, Gebreselassie W, MD  JARDIANCE 10 MG TABS tablet Take 10 mg by mouth daily.    [provider]  mycophenolate  (CELLCEPT ) 250 MG capsule Take 1,000 mg by mouth 2 (two) times daily.    [provider]  predniSONE  (DELTASONE ) 5 MG tablet Take 5 mg by mouth daily.    [provider]  simvastatin  (ZOCOR ) 20 MG tablet Take 20 mg by mouth daily.    [provider]  tacrolimus  (PROGRAF ) 1 MG capsule Take 2 mg by mouth 2 (two) times daily.    [provider]  tetrahydrozoline-zinc (VISINE-AC) 0.05-0.25 % ophthalmic solution Place 2 drops into both eyes 3 (three) times daily as needed. Dry/Red Eyes    [provider]    Physical Exam: Vitals:   08/12/23 0545 08/12/23 0600 08/12/23 0615 08/12/23 0630  BP: (!) 165/96 (!) 175/101 (!) 161/99 (!) 169/101  Pulse: 72 80 71 66  Resp: 19 20 18 19   Temp:      TempSrc:      SpO2:  93% 94% 95%  Weight:      Height:        Constitutional: NAD, calm, comfortable Vitals:   08/12/23 0545 08/12/23 0600 08/12/23 0615 08/12/23 0630  BP: (!) 165/96 (!) 175/101 (!) 161/99 (!) 169/101  Pulse: 72 80 71 66  Resp: 19 20 18 19   Temp:      TempSrc:      SpO2:  93% 94% 95%  Weight:      Height:       Eyes: lids and conjunctivae normal Neck: normal, supple Respiratory: clear to auscultation bilaterally. Normal respiratory effort. No accessory muscle use.  Nasal cannula Cardiovascular: Regular rate and rhythm, no murmurs. Abdomen: no tenderness, no distention. Bowel sounds positive.   Musculoskeletal:  No edema. Skin: no rashes, lesions, ulcers.  Psychiatric: Flat affect  Labs on Admission: I have personally reviewed following labs and imaging studies  CBC: Recent Labs  Lab 08/12/23 0359  WBC 6.3  HGB 11.9*  HCT 38.5*  MCV 88.1  PLT 171   Basic Metabolic Panel: Recent Labs  Lab 08/12/23 0359  NA 140  K 4.5  CL 111  CO2 22  GLUCOSE 95  BUN 33*  CREATININE 2.39*  CALCIUM 8.6*   GFR: Estimated Creatinine Clearance: 30.2 mL/min (A) (by C-G formula based on SCr of 2.39 mg/dL (H)). Liver Function Tests: Recent Labs  Lab 08/12/23 0359  AST 19  ALT 12  ALKPHOS 77  BILITOT 0.3  PROT 6.8  ALBUMIN 3.1*   No results for input(s): LIPASE, AMYLASE in the last 168 hours. No results for input(s): AMMONIA in the last 168 hours. Coagulation Profile: No results for input(s): INR, PROTIME in the last 168 hours. Cardiac Enzymes: No results for input(s): CKTOTAL, CKMB, CKMBINDEX, TROPONINI in the last 168 hours. BNP (last 3 results) No results for input(s): PROBNP in the last 8760 hours. HbA1C: No results for input(s): HGBA1C in the last 72 hours. CBG: No results for input(s): GLUCAP in the last 168 hours. Lipid Profile: No results for input(s): CHOL, HDL, LDLCALC, TRIG, CHOLHDL, LDLDIRECT in the last 72 hours. Thyroid Function Tests: No results for input(s): TSH, T4TOTAL, FREET4, T3FREE, THYROIDAB in the last 72 hours. Anemia Panel: No results for input(s): VITAMINB12, FOLATE, FERRITIN, TIBC, IRON, RETICCTPCT in the last 72 hours. Urine analysis:    Component Value Date/Time   COLORURINE YELLOW 10/22/2018 0036   APPEARANCEUR CLEAR 10/22/2018 0036   LABSPEC 1.023 10/22/2018 0036   PHURINE 5.0 10/22/2018 0036   GLUCOSEU >=500 (A) 10/22/2018 0036   HGBUR SMALL (A) 10/22/2018 0036   BILIRUBINUR NEGATIVE 10/22/2018 0036   KETONESUR 5 (A) 10/22/2018 0036   PROTEINUR 100 (A) 10/22/2018 0036    UROBILINOGEN 0.2 11/14/2014 2134   NITRITE NEGATIVE 10/22/2018 0036   LEUKOCYTESUR NEGATIVE 10/22/2018 0036    Radiological Exams on Admission: DG Chest Port 1 View Result Date: 08/12/2023 CLINICAL DATA:  Shortness of breath. EXAM: PORTABLE CHEST 1 VIEW COMPARISON:  10/21/2018 FINDINGS: Heart size is within normal limits. No pleural effusion or edema. Multifocal bilateral airspace opacities are identified. No pneumothorax identified. Visualized osseous structures are unremarkable. IMPRESSION: Multifocal bilateral airspace opacities compatible with multifocal pneumonia. Electronically Signed   By: Waddell Calk M.D.   On: 08/12/2023 05:02    EKG: Independently reviewed.  SR 74 bpm  Assessment/Plan Principal Problem:  Multifocal pneumonia Active Problems:   Type 2 diabetes mellitus with other specified complication (HCC)   History of renal transplant   DDD (degenerative disc disease), lumbar   Scrotal mass    Multifocal pneumonia - Continue on azithromycin  and Rocephin  - Patient is high risk given immunocompromise in the setting of renal transplant history - No blood cultures obtained in ED - Continue to monitor  Anemia - Obtain anemia panel - Monitor CBC with no overt bleeding noted - If there is a significant drop, plan to obtain stool occult  BNP elevation - Obtain 2D echocardiogram - Symptoms of some orthopnea noted  Hypertension-uncontrolled - Continue home medications - IV hydralazine as needed  Type 2 diabetes - Carb modified diet -A1c 8.7% on 06/29/2023 - SSI  History of renal transplant with CKD stage IIIb - Continue prednisone , CellCept , and tacrolimus  for now and consider holding if clinical condition does not seem to improve - Continue to monitor creatinine levels - Follow-up nephrology outpatient   DVT prophylaxis: Heparin Code Status: Full Family Communication: Spouse at bedside 6/26 Disposition Plan: Admit for pneumonia treatment Consults called:  None Admission status: Inpatient, MedSurg  Severity of Illness: The appropriate patient status for this patient is INPATIENT. Inpatient status is judged to be reasonable and necessary in order to provide the required intensity of service to ensure the patient's safety. The patient's presenting symptoms, physical exam findings, and initial radiographic and laboratory data in the context of their chronic comorbidities is felt to place them at high risk for further clinical deterioration. Furthermore, it is not anticipated that the patient will be medically stable for discharge from the hospital within 2 midnights of admission.   * I certify that at the point of admission it is my clinical judgment that the patient will require inpatient hospital care spanning beyond 2 midnights from the point of admission due to high intensity of service, high risk for further deterioration and high frequency of surveillance required.*   Teghan Philbin D Gerritt Galentine DO [facility]  If 7PM-7AM, please contact night-coverage [email or web address]  08/12/2023, 7:36 AM

## 2023-08-12 NOTE — ED Provider Notes (Signed)
 AP-EMERGENCY DEPT [facility] Emergency Department Provider Note MRN:  [id number]  Arrival date & time: 08/12/23     Chief Complaint   Shortness of Breath   History of Present Illness   [name] is a 58 y.o. year-old male with a history of hypertension, diabetes, renal transplant presenting to the ED with chief complaint of shortness of breath.  2 weeks of worsening shortness of breath, increasing cough for the past couple days much worse at night especially when trying to lay flat.  Denies fever, no chest pain.  Review of Systems  A thorough review of systems was obtained and all systems are negative except as noted in the HPI and PMH.   Patient's Health History    Past Medical History:  Diagnosis Date   DDD (degenerative disc disease), lumbar    Diabetes mellitus    Hypertension    Polycystic kidney disease     Past Surgical History:  Procedure Laterality Date   NEPHRECTOMY TRANSPLANTED ORGAN      Family History  Problem Relation Age of Onset   Diabetes Mother    Kidney disease Mother    Stroke Mother    Kidney disease Father    Hypertension Father    Stroke Father    Heart disease Father     Social History   Socioeconomic History   Marital status: Single    Spouse name: Not on file   Number of children: Not on file   Years of education: Not on file   Highest education level: Not on file  Occupational History   Not on file  Tobacco Use   Smoking status: Never   Smokeless tobacco: Never  Substance and Sexual Activity   Alcohol use: No    Comment: rarely   Drug use: No   Sexual activity: Not Currently  Other Topics Concern   Not on file  Social History Narrative   Not on file   Social Drivers of Health   Financial Resource Strain: Not on file  Food Insecurity: Not on file  Transportation Needs: Not on file  Physical Activity: Not on file  Stress: Not on file  Social Connections: Not on file  Intimate Partner Violence: Not on file      Physical Exam   Vitals:   08/12/23 0615 08/12/23 0630  BP: (!) 161/99 (!) 169/101  Pulse: 71 66  Resp: 18 19  Temp:    SpO2: 94% 95%    CONSTITUTIONAL: Well-appearing, NAD NEURO/PSYCH:  Alert and oriented x 3, no focal deficits EYES:  eyes equal and reactive ENT/NECK:  no LAD, no JVD CARDIO: Regular rate, well-perfused, normal S1 and S2 PULM: Diffuse rhonchi, mild tachypnea GI/GU:  non-distended, non-tender MSK/SPINE:  No gross deformities, no edema SKIN:  no rash, atraumatic   *Additional and/or pertinent findings included in MDM below  Diagnostic and Interventional Summary    EKG Interpretation Date/Time:    Ventricular Rate:    PR Interval:    QRS Duration:    QT Interval:    QTC Calculation:   R Axis:      Text Interpretation:         Labs Reviewed  CBC - Abnormal; Notable for the following components:      Result Value   Hemoglobin 11.9 (*)    HCT 38.5 (*)    All other components within normal limits  COMPREHENSIVE METABOLIC PANEL WITH GFR - Abnormal; Notable for the following components:   BUN 33 (*)  Creatinine, Ser 2.39 (*)    Calcium 8.6 (*)    Albumin 3.1 (*)    GFR, Estimated 31 (*)    All other components within normal limits  BRAIN NATRIURETIC PEPTIDE - Abnormal; Notable for the following components:   B Natriuretic Peptide 306.0 (*)    All other components within normal limits  TROPONIN I (HIGH SENSITIVITY) - Abnormal; Notable for the following components:   Troponin I (High Sensitivity) 21 (*)    All other components within normal limits  TROPONIN I (HIGH SENSITIVITY) - Abnormal; Notable for the following components:   Troponin I (High Sensitivity) 20 (*)    All other components within normal limits    DG Chest Port 1 View  Final Result      Medications  azithromycin  (ZITHROMAX ) 500 mg in sodium chloride  0.9 % 250 mL IVPB (500 mg Intravenous New Bag/Given 08/12/23 9361)  ipratropium-albuterol  (DUONEB) 0.5-2.5 (3) MG/3ML nebulizer  solution 3 mL (3 mLs Nebulization Given 08/12/23 0442)  cefTRIAXone  (ROCEPHIN ) 2 g in sodium chloride  0.9 % 100 mL IVPB (0 g Intravenous Stopped 08/12/23 0612)     Procedures  /  Critical Care Procedures  ED Course and Medical Decision Making  Initial Impression and Ddx Question pulmonary edema, pneumonia, viral illness, fluid overloaded state related to liver or renal impairment.  Providing breathing treatment to treat for any possible reactive airway causes while we await labs, x-ray.  Past medical/surgical history that increases complexity of ED encounter: Kidney transplant  Interpretation of Diagnostics I personally reviewed the EKG and my interpretation is as follows: Sinus rhythm, strain pattern  No significant blood count or electrolyte disturbance.  Elevated creatinine similar to baseline values per patient.  Minimally elevated BNP and troponin.  Patient Reassessment and Ultimate Disposition/Management     Chest x-ray revealing evidence of multifocal pneumonia.  Given patient's presentation, comorbidities will admit to medicine.  Patient management required discussion with the following services or consulting groups:  Hospitalist Service  Complexity of Problems Addressed Acute illness or injury that poses threat of life of bodily function  Additional Data Reviewed and Analyzed Further history obtained from: Further history from spouse/family member  Additional Factors Impacting ED Encounter Risk Consideration of hospitalization  [name]. Theadore, MD [facility] Emergency Medicine [facility] [email or web address]  Final Clinical Impressions(s) / ED Diagnoses     ICD-10-CM   1. Multifocal pneumonia  J18.9       ED Discharge Orders     None        Discharge Instructions Discussed with and Provided to Patient:   Discharge Instructions   None      Theadore [name], MD 08/12/23 [phone]

## 2023-08-12 NOTE — ED Triage Notes (Signed)
 Pov form home. Cc of sob x 2 weeks. + cough. Worse when he lays down. Ambulatory to tx room Hr 78 and 92% after ambulating from lobby.

## 2023-08-12 NOTE — Progress Notes (Signed)
 08/12/2023-No show

## 2023-08-12 NOTE — Progress Notes (Signed)
*  PRELIMINARY RESULTS* Echocardiogram 2D Echocardiogram has been performed.  [name] 08/12/2023, 2:15 PM

## 2023-08-12 NOTE — ED Notes (Signed)
 Call placed to xray transporter for transport to room 323

## 2023-08-12 NOTE — Plan of Care (Signed)

## 2023-08-13 DIAGNOSIS — I5021 Acute systolic (congestive) heart failure: Secondary | ICD-10-CM

## 2023-08-13 DIAGNOSIS — J189 Pneumonia, unspecified organism: Secondary | ICD-10-CM | POA: Diagnosis not present

## 2023-08-13 DIAGNOSIS — I10 Essential (primary) hypertension: Secondary | ICD-10-CM | POA: Diagnosis not present

## 2023-08-13 LAB — CBC
HCT: 37.7 % — ABNORMAL LOW (ref 39.0–52.0)
Hemoglobin: 11.6 g/dL — ABNORMAL LOW (ref 13.0–17.0)
MCH: 26.5 pg (ref 26.0–34.0)
MCHC: 30.8 g/dL (ref 30.0–36.0)
MCV: 86.3 fL (ref 80.0–100.0)
Platelets: 175 10*3/uL (ref 150–400)
RBC: 4.37 MIL/uL (ref 4.22–5.81)
RDW: 13.7 % (ref 11.5–15.5)
WBC: 6.6 10*3/uL (ref 4.0–10.5)
nRBC: 0 % (ref 0.0–0.2)

## 2023-08-13 LAB — GLUCOSE, CAPILLARY
Glucose-Capillary: 83 mg/dL (ref 70–99)
Glucose-Capillary: 145 mg/dL — ABNORMAL HIGH (ref 70–99)
Glucose-Capillary: 183 mg/dL — ABNORMAL HIGH (ref 70–99)
Glucose-Capillary: 376 mg/dL — ABNORMAL HIGH (ref 70–99)

## 2023-08-13 LAB — COLOGUARD: COLOGUARD: NEGATIVE

## 2023-08-13 LAB — HIV ANTIBODY (ROUTINE TESTING W REFLEX): HIV Screen 4th Generation wRfx: NONREACTIVE

## 2023-08-13 LAB — BASIC METABOLIC PANEL WITH GFR
Anion gap: 10 (ref 5–15)
BUN: 39 mg/dL — ABNORMAL HIGH (ref 6–20)
CO2: 19 mmol/L — ABNORMAL LOW (ref 22–32)
Calcium: 8.9 mg/dL (ref 8.9–10.3)
Chloride: 109 mmol/L (ref 98–111)
Creatinine, Ser: 2.61 mg/dL — ABNORMAL HIGH (ref 0.61–1.24)
GFR, Estimated: 28 mL/min — ABNORMAL LOW (ref >60)
Glucose, Bld: 175 mg/dL — ABNORMAL HIGH (ref 70–99)
Potassium: 4.6 mmol/L (ref 3.5–5.1)
Sodium: 138 mmol/L (ref 135–145)

## 2023-08-13 LAB — MAGNESIUM: Magnesium: 1.8 mg/dL (ref 1.7–2.4)

## 2023-08-13 MED ORDER — HYDRALAZINE HCL 25 MG PO TABS
25.0000 mg | ORAL_TABLET | Freq: Three times a day (TID) | ORAL | Status: DC
  Administered 2023-08-13 – 2023-08-14 (×4): 25 mg via ORAL
  Filled 2023-08-13 (×4): qty 1

## 2023-08-13 MED ORDER — FUROSEMIDE 10 MG/ML IJ SOLN
40.0000 mg | Freq: Once | INTRAMUSCULAR | Status: AC
  Administered 2023-08-13: 40 mg via INTRAVENOUS
  Filled 2023-08-13: qty 4

## 2023-08-13 MED ORDER — ISOSORBIDE MONONITRATE ER 30 MG PO TB24
15.0000 mg | ORAL_TABLET | Freq: Every day | ORAL | Status: DC
  Administered 2023-08-13 – 2023-08-14 (×2): 15 mg via ORAL
  Filled 2023-08-13 (×2): qty 1

## 2023-08-13 NOTE — Consult Note (Addendum)
 Cardiology Consultation   Patient ID: [name] MRN: [id number]; DOB: 09/28/1965  Admit date: 08/12/2023 Date of Consult: 08/13/2023  PCP:  Tobie Suzzane POUR, MD   [facility] Providers Cardiologist:  New    Patient Profile: [name] is a 58 y.o. male with a hx of HTN, DM2, prior renal transplant,  who is being seen 08/13/2023 for the evaluation of SOB at the request of Dr Maree.  History of Present Illness: Mr. Davoli HTN, DM2, prior renal transplant admitted with SOB x 2 days. Admitted to primary with diagnosis of multifocal pneumonia, has been started on antibiotics. Reports 3 weeks of progressing SOB, cough. No LE edema, but reports feelings of abdominal fullness and also has had some recent scrotal edema. Denies any specific exertional chest pains.    WBC 6.3 Hgb 11.9 Plt 171 K 4.5 Cr 2.39 BUN 33 BNP 306  Trop 21-->20 EKG SR, diffuse TWIs CXR: multifocal bilateral opacities consistent with multifocal pneumonia 07/2023 echo: LVE 35-40%, Global hypokinesis, worse in the anterior, inferior and septal walls. Grade I dd. Normal RV function  Past Medical History:  Diagnosis Date   DDD (degenerative disc disease), lumbar    Diabetes mellitus    Hypertension    Polycystic kidney disease     Past Surgical History:  Procedure Laterality Date   NEPHRECTOMY TRANSPLANTED ORGAN        Scheduled Meds:  amLODipine   10 mg Oral Daily   aspirin  EC  81 mg Oral Daily   carvedilol   6.25 mg Oral BID   empagliflozin   10 mg Oral Daily   heparin   5,000 Units Subcutaneous Q8H   insulin  aspart  0-5 Units Subcutaneous QHS   insulin  aspart  0-9 Units Subcutaneous TID WC   insulin  glargine-yfgn  20 Units Subcutaneous QHS   mycophenolate   1,000 mg Oral BID   predniSONE   5 mg Oral Daily   simvastatin   20 mg Oral Daily   tacrolimus   2 mg Oral BID   Continuous Infusions:  azithromycin  500 mg (08/13/23 9357)   cefTRIAXone  (ROCEPHIN )  IV 2 g (08/13/23 0536)   PRN  Meds: acetaminophen  **OR** acetaminophen , guaiFENesin -dextromethorphan , naphazoline-glycerin , ondansetron  **OR** ondansetron  (ZOFRAN ) IV, oxyCODONE   Allergies:   No Known Allergies  Social History:   Social History   Socioeconomic History   Marital status: Single    Spouse name: Not on file   Number of children: Not on file   Years of education: Not on file   Highest education level: Not on file  Occupational History   Not on file  Tobacco Use   Smoking status: Never   Smokeless tobacco: Never  Substance and Sexual Activity   Alcohol use: No    Comment: rarely   Drug use: No   Sexual activity: Not Currently  Other Topics Concern   Not on file  Social History Narrative   Not on file   Social Drivers of Health   Financial Resource Strain: Not on file  Food Insecurity: No Food Insecurity (08/12/2023)   Hunger Vital Sign    Worried About Running Out of Food in the Last Year: Never true    Ran Out of Food in the Last Year: Never true  Transportation Needs: No Transportation Needs (08/12/2023)   PRAPARE - Transportation    Lack of Transportation (Medical): No    Lack of Transportation (Non-Medical): No  Physical Activity: Not on file  Stress: Not on file  Social Connections: Not on file  Intimate Partner Violence:  Not At Risk (08/12/2023)   Humiliation, Afraid, Rape, and Kick questionnaire    Fear of Current or Ex-Partner: No    Emotionally Abused: No    Physically Abused: No    Sexually Abused: No    Family History:    Family History  Problem Relation Age of Onset   Diabetes Mother    Kidney disease Mother    Stroke Mother    Kidney disease Father    Hypertension Father    Stroke Father    Heart disease Father      ROS:  Please see the history of present illness.   All other ROS reviewed and negative.     Physical Exam/Data: Vitals:   08/12/23 1839 08/12/23 2105 08/12/23 2300 08/13/23 0500  BP: (!) 151/86 (!) 149/90 (!) 142/84 (!) 148/89  Pulse: 83 80  76 77  Resp: 18  18 18   Temp: 98.5 F (36.9 C)  98.2 F (36.8 C) 98.5 F (36.9 C)  TempSrc: Oral  Oral Oral  SpO2: 100%  97% 99%  Weight:      Height:        Intake/Output Summary (Last 24 hours) at 08/13/2023 0957 Last data filed at 08/13/2023 0600 Gross per 24 hour  Intake 293.99 ml  Output --  Net 293.99 ml      08/12/2023    2:36 PM 08/12/2023    3:52 AM 07/14/2023    8:01 AM  Last 3 Weights  Weight (lbs) 159 lb 9.8 oz 156 lb 15.5 oz 157 lb  Weight (kg) 72.4 kg 71.2 kg 71.215 kg     Body mass index is 26.16 kg/m.  General:  Well nourished, well developed, in no acute distress HEENT: normal Neck: no JVD Vascular: No carotid bruits; Distal pulses 2+ bilaterally Cardiac:  normal S1, S2; RRR; no murmur  Lungs:  faint crackles bilaterally Abd: soft, nontender, no hepatomegaly  Ext: no edema Musculoskeletal:  No deformities, BUE and BLE strength normal and equal Skin: warm and dry  Neuro:  CNs 2-12 intact, no focal abnormalities noted Psych:  Normal affect     Laboratory Data: High Sensitivity Troponin:   Recent Labs  Lab 08/12/23 0359 08/12/23 0614  TROPONINIHS 21* 20*     Chemistry Recent Labs  Lab 08/12/23 0359 08/13/23 0446  NA 140 138  K 4.5 4.6  CL 111 109  CO2 22 19*  GLUCOSE 95 175*  BUN 33* 39*  CREATININE 2.39* 2.61*  CALCIUM 8.6* 8.9  MG  --  1.8  GFRNONAA 31* 28*  ANIONGAP 7 10    Recent Labs  Lab 08/12/23 0359  PROT 6.8  ALBUMIN 3.1*  AST 19  ALT 12  ALKPHOS 77  BILITOT 0.3   Lipids No results for input(s): CHOL, TRIG, HDL, LABVLDL, LDLCALC, CHOLHDL in the last 168 hours.  Hematology Recent Labs  Lab 08/12/23 0359 08/12/23 0834 08/13/23 0446  WBC 6.3  --  6.6  RBC 4.37 4.65 4.37  HGB 11.9*  --  11.6*  HCT 38.5*  --  37.7*  MCV 88.1  --  86.3  MCH 27.2  --  26.5  MCHC 30.9  --  30.8  RDW 13.8  --  13.7  PLT 171  --  175   Thyroid No results for input(s): TSH, FREET4 in the last 168 hours.   BNP Recent Labs  Lab 08/12/23 0359  BNP 306.0*    DDimer No results for input(s): DDIMER in the last 168 hours.  Radiology/Studies:  ECHOCARDIOGRAM COMPLETE Result Date: 08/12/2023    ECHOCARDIOGRAM REPORT   Patient Name:   [name] Date of Exam: 08/12/2023 Medical Rec #:  [id number]     Height:       65.5 in Accession #:    [id number]    Weight:       157.0 lb Date of Birth:  September 23, 1965     BSA:          1.795 m Patient Age:    58 years      BP:           174/100 mmHg Patient Gender: M             HR:           77 bpm. Exam Location:  [facility] Procedure: 2D Echo, 3D Echo, Cardiac Doppler and Color Doppler (Both Spectral            and Color Flow Doppler were utilized during procedure). Indications:    Dyspnea R06.00  History:        Patient has prior history of Echocardiogram examinations, most                 recent 09/24/2003. Risk Factors:Hypertension, Diabetes and                 Dyslipidemia. Hx of ESRD.  Sonographer:    Aida Pizza RCS Referring Phys: [id number] PRATIK D [facility] IMPRESSIONS  1. Global hypokinesis, worse in the anterior, inferior and septal walls . Left ventricular ejection fraction, by estimation, is 35 to 40%. The left ventricle has moderately decreased function. The left ventricle demonstrates global hypokinesis. The left  ventricular internal cavity size was mildly dilated. There is mild left ventricular hypertrophy. Left ventricular diastolic parameters are consistent with Grade I diastolic dysfunction (impaired relaxation).  2. Right ventricular systolic function is normal. The right ventricular size is normal.  3. Left atrial size was moderately dilated.  4. Mild mitral valve regurgitation.  5. The aortic valve is tricuspid. Aortic valve regurgitation is not visualized. Aortic valve sclerosis/calcification is present, without any evidence of aortic stenosis.  6. The inferior vena cava is normal in size with greater than 50% respiratory variability, suggesting right atrial  pressure of 3 mmHg. FINDINGS  Left Ventricle: Global hypokinesis, worse in the anterior, inferior and septal walls. Left ventricular ejection fraction, by estimation, is 35 to 40%. The left ventricle has moderately decreased function. The left ventricle demonstrates global hypokinesis. 3D ejection fraction reviewed and evaluated as part of the interpretation. Alternate measurement of EF is felt to be most reflective of LV function. The left ventricular internal cavity size was mildly dilated. There is mild left ventricular  hypertrophy. Left ventricular diastolic parameters are consistent with Grade I diastolic dysfunction (impaired relaxation). Right Ventricle: The right ventricular size is normal. Right vetricular wall thickness was not assessed. Right ventricular systolic function is normal. Left Atrium: Left atrial size was moderately dilated. Right Atrium: Right atrial size was normal in size. Pericardium: There is no evidence of pericardial effusion. Mitral Valve: There is mild thickening of the mitral valve leaflet(s). Mild mitral valve regurgitation. Tricuspid Valve: The tricuspid valve is normal in structure. Tricuspid valve regurgitation is trivial. Aortic Valve: The aortic valve is tricuspid. Aortic valve regurgitation is not visualized. Aortic valve sclerosis/calcification is present, without any evidence of aortic stenosis. Pulmonic Valve: The pulmonic valve was normal in structure. Pulmonic valve regurgitation is trivial. Aorta: The aortic root is normal  in size and structure. Venous: The inferior vena cava is normal in size with greater than 50% respiratory variability, suggesting right atrial pressure of 3 mmHg. IAS/Shunts: No atrial level shunt detected by color flow Doppler.  LEFT VENTRICLE PLAX 2D LVIDd:         5.60 cm   Diastology LVIDs:         4.40 cm   LV e' medial:    4.68 cm/s LV PW:         1.20 cm   LV E/e' medial:  18.1 LV IVS:        1.00 cm   LV e' lateral:   6.09 cm/s LVOT diam:      2.00 cm   LV E/e' lateral: 13.9 LV SV:         59 LV SV Index:   33 LVOT Area:     3.14 cm                           3D Volume EF:                          3D EF:        41 %                          LV EDV:       161 ml                          LV ESV:       95 ml                          LV SV:        66 ml RIGHT VENTRICLE RV S prime:     17.00 cm/s TAPSE (M-mode): 2.7 cm LEFT ATRIUM             Index        RIGHT ATRIUM           Index LA diam:        4.10 cm 2.28 cm/m   RA Area:     14.00 cm LA Vol (A2C):   55.0 ml 30.64 ml/m  RA Volume:   27.40 ml  15.27 ml/m LA Vol (A4C):   88.6 ml 49.37 ml/m LA Biplane Vol: 70.3 ml 39.17 ml/m  AORTIC VALVE LVOT Vmax:   79.40 cm/s LVOT Vmean:  56.700 cm/s LVOT VTI:    0.189 m  AORTA Ao Root diam: 3.70 cm MITRAL VALVE MV Area (PHT): 4.49 cm     SHUNTS MV Decel Time: 169 msec     Systemic VTI:  0.19 m MV E velocity: 84.90 cm/s   Systemic Diam: 2.00 cm MV A velocity: 121.00 cm/s MV E/A ratio:  0.70 Vina Gull MD Electronically signed by Vina Gull MD Signature Date/Time: 08/12/2023/3:51:28 PM    Final    DG Chest Port 1 View Result Date: 08/12/2023 CLINICAL DATA:  Shortness of breath. EXAM: PORTABLE CHEST 1 VIEW COMPARISON:  10/21/2018 FINDINGS: Heart size is within normal limits. No pleural effusion or edema. Multifocal bilateral airspace opacities are identified. No pneumothorax identified. Visualized osseous structures are unremarkable. IMPRESSION: Multifocal bilateral airspace opacities compatible with multifocal pneumonia. Electronically Signed   By: Waddell Calk M.D.   On: 08/12/2023 05:02  Assessment and Plan: 1.Acute HFrEF -07/2023 echo: LVE 35-40%, Global hypokinesis, worse in the anterior, inferior and septal walls. Grade I dd. Normal RV function - CXR: multifocal bilateral opacities consistent with multifocal pneumonia. BNP 306  - difficult to assess symptoms as he also is being managed for pneumonia - faint crackles on exam, BNP mildly  elevated. No LE edema but some scrotal edema may be related to HF, will dose IV lasix 40mg  x 1 and monitor response and renal function closely.  - check reds vest, resulted at 41% today which is elevated  - medical therapy limited by CKD, transplanted kidney. GFR 30 - on coreg  at home, continued. Would avoid ARNI, MRA given GFR. Borderline for SGLT2i, ok to be on and monitor renal function, has been on at home as well. High bp's, [place] with advanced CKD, would start hydralazine 25mg  tid and imdur 15mg  daily.  - poor cath candiate, transplanted kidney in 2007 with current GFR 30. Would treat medically at this time.  - denies any EtoH/drug use. BP's elevated here but reports as outpatient has been well controlled. Denies significant family history of cardiomyopathy.    2.Multifocal pneumonia - per primary team   3.History of renal transplant Jan 2007 - followed at [facility] - Cr 2.39 on admission. Over last 6 months varied 2.35-2.9   4. HTN - elevated bp's - adding hydral/imdur as listed above, continue imdur, norvasc  10  Would monitor response to diuretic and new meds today, possible d/c tomorrow.       For questions or updates, please contact [facility] Please consult [email or web address] for contact info under    Signed, Alvan Carrier, MD  08/13/2023 9:57 AM

## 2023-08-13 NOTE — Progress Notes (Signed)
   08/13/23 1136  ReDS Vest / Clip  BMI (Calculated) 26.15  Station Marker A  Ruler Value 30  ReDS Value Range (!) > 40  ReDS Actual Value 41

## 2023-08-13 NOTE — Progress Notes (Signed)
 Transition of Care Department [facility]) has reviewed patient and no other TOC needs have been identified at this time. We will continue to monitor patient advancement through interdisciplinary progression rounds. If new patient transition needs arise, please place a TOC consult.   08/13/23 0907  TOC Brief Assessment  Insurance and Status Reviewed  Patient has primary care physician Yes  Home environment has been reviewed Lives with significant other.  Prior level of function: Independent.  Prior/Current Home Services No current home services  Social Drivers of Health Review SDOH reviewed no interventions necessary  Readmission risk has been reviewed Yes  Transition of care needs no transition of care needs at this time

## 2023-08-13 NOTE — Progress Notes (Signed)
 PROGRESS NOTE    [name]  FMW:[id number] DOB: [birth year] DOA: 08/12/2023 PCP: Tobie Suzzane POUR, MD   Brief Narrative:    [name] is a 58 y.o. male with medical history significant for hypertension, diabetes, and prior renal transplant who presented to the ED with complaints of worsening shortness of breath over the last 2 weeks.  This has been associated with increasing cough over the last 2 days which is especially worse at night when laying flat.  Patient has been admitted for treatment of multifocal pneumonia, but now has new findings of acute HFrEF on 2D echocardiogram and is being diuresed with medical management per cardiology.  Assessment & Plan:   Principal Problem:   Multifocal pneumonia Active Problems:   Type 2 diabetes mellitus with other specified complication (HCC)   History of renal transplant   DDD (degenerative disc disease), lumbar   Scrotal mass  Assessment and Plan:   Multifocal pneumonia - Continue on azithromycin  and Rocephin  - Patient is high risk given immunocompromise in the setting of renal transplant history - No blood cultures obtained in ED - Continue to monitor  Acute HFrEF -07/2023 echocardiogram with LVEF 35-40% with global hypokinesis -Appreciate cardiology recommendations with 1 dose of Lasix -Check Reds vest -Poor cath candidate and medical therapy limited by transplanted kidney -Hydralazine 25 mg 3 times daily and Imdur 50 mg daily -Monitor response to diuretics and new medications with possible DC in a.m.   Anemia due to iron deficiency - Will need iron supplementation on discharge - Monitor CBC with no overt bleeding noted - Plan to follow-up with GI outpatient   Hypertension-uncontrolled - Continue Imdur, hydralazine, Norvasc , hold ACE inhibitor - IV hydralazine as needed   Type 2 diabetes - Carb modified diet -A1c 8.7% on 06/29/2023 - SSI   History of renal transplant with CKD stage IIIb - Continue prednisone ,  CellCept , and tacrolimus  for now and consider holding if clinical condition does not seem to improve - Continue to monitor creatinine levels - Follow-up nephrology outpatient    DVT prophylaxis: Heparin  Code Status: Full Family Communication: Spouse on phone 6/27 Disposition Plan:  Status is: Inpatient Remains inpatient appropriate because: Need for IV medications  Consultants:  Cardiology  Procedures:  None  Antimicrobials:  Anti-infectives (From admission, onward)    Start     Dose/Rate Route Frequency Ordered Stop   08/13/23 0600  cefTRIAXone  (ROCEPHIN ) 2 g in sodium chloride  0.9 % 100 mL IVPB        2 g 200 mL/hr over 30 Minutes Intravenous Every 24 hours 08/12/23 0726     08/12/23 0530  cefTRIAXone  (ROCEPHIN ) 2 g in sodium chloride  0.9 % 100 mL IVPB        2 g 200 mL/hr over 30 Minutes Intravenous  Once 08/12/23 0516 08/12/23 0612   08/12/23 0530  azithromycin  (ZITHROMAX ) 500 mg in sodium chloride  0.9 % 250 mL IVPB        500 mg 250 mL/hr over 60 Minutes Intravenous Every 24 hours 08/12/23 0516         Subjective: Patient seen and evaluated today with no new acute complaints or concerns. No acute concerns or events noted overnight.  Continues to have some scrotal edema.  Denies any shortness of breath or chest pain.  Objective: Vitals:   08/12/23 1839 08/12/23 2105 08/12/23 2300 08/13/23 0500  BP: (!) 151/86 (!) 149/90 (!) 142/84 (!) 148/89  Pulse: 83 80 76 77  Resp: 18  18 18   Temp:  98.5 F (36.9 C)  98.2 F (36.8 C) 98.5 F (36.9 C)  TempSrc: Oral  Oral Oral  SpO2: 100%  97% 99%  Weight:      Height:        Intake/Output Summary (Last 24 hours) at 08/13/2023 1130 Last data filed at 08/13/2023 0600 Gross per 24 hour  Intake 293.99 ml  Output --  Net 293.99 ml   Filed Weights   08/12/23 0352 08/12/23 1436  Weight: 71.2 kg 72.4 kg    Examination:  General exam: Appears calm and comfortable  Respiratory system: Clear to auscultation. Respiratory  effort normal. Cardiovascular system: S1 & S2 heard, RRR.  Gastrointestinal system: Abdomen is soft Central nervous system: Alert and awake Extremities: No edema Skin: No significant lesions noted Psychiatry: Flat affect.    Data Reviewed: I have personally reviewed following labs and imaging studies  CBC: Recent Labs  Lab 08/12/23 0359 08/13/23 0446  WBC 6.3 6.6  HGB 11.9* 11.6*  HCT 38.5* 37.7*  MCV 88.1 86.3  PLT 171 175   Basic Metabolic Panel: Recent Labs  Lab 08/12/23 0359 08/13/23 0446  NA 140 138  K 4.5 4.6  CL 111 109  CO2 22 19*  GLUCOSE 95 175*  BUN 33* 39*  CREATININE 2.39* 2.61*  CALCIUM 8.6* 8.9  MG  --  1.8   GFR: Estimated Creatinine Clearance: 27.7 mL/min (A) (by C-G formula based on SCr of 2.61 mg/dL (H)). Liver Function Tests: Recent Labs  Lab 08/12/23 0359  AST 19  ALT 12  ALKPHOS 77  BILITOT 0.3  PROT 6.8  ALBUMIN 3.1*   No results for input(s): LIPASE, AMYLASE in the last 168 hours. No results for input(s): AMMONIA in the last 168 hours. Coagulation Profile: No results for input(s): INR, PROTIME in the last 168 hours. Cardiac Enzymes: No results for input(s): CKTOTAL, CKMB, CKMBINDEX, TROPONINI in the last 168 hours. BNP (last 3 results) No results for input(s): PROBNP in the last 8760 hours. HbA1C: No results for input(s): HGBA1C in the last 72 hours. CBG: Recent Labs  Lab 08/12/23 0851 08/12/23 1129 08/12/23 1607 08/12/23 2054 08/13/23 0728  GLUCAP 106* 201* 101* 287* 83   Lipid Profile: No results for input(s): CHOL, HDL, LDLCALC, TRIG, CHOLHDL, LDLDIRECT in the last 72 hours. Thyroid Function Tests: No results for input(s): TSH, T4TOTAL, FREET4, T3FREE, THYROIDAB in the last 72 hours. Anemia Panel: Recent Labs    08/12/23 0834  VITAMINB12 222  FOLATE 10.2  FERRITIN 117  TIBC 246*  IRON 34*  RETICCTPCT 1.9   Sepsis Labs: No results for input(s): PROCALCITON,  LATICACIDVEN in the last 168 hours.  No results found for this or any previous visit (from the past 240 hours).       Radiology Studies: ECHOCARDIOGRAM COMPLETE Result Date: 08/12/2023    ECHOCARDIOGRAM REPORT   Patient Name:   [name] Date of Exam: 08/12/2023 Medical Rec #:  [id number]     Height:       65.5 in Accession #:    [id number]    Weight:       157.0 lb Date of Birth:  12-15-1965     BSA:          1.795 m Patient Age:    58 years      BP:           174/100 mmHg Patient Gender: M             HR:  77 bpm. Exam Location:  [facility] Procedure: 2D Echo, 3D Echo, Cardiac Doppler and Color Doppler (Both Spectral            and Color Flow Doppler were utilized during procedure). Indications:    Dyspnea R06.00  History:        Patient has prior history of Echocardiogram examinations, most                 recent 09/24/2003. Risk Factors:Hypertension, Diabetes and                 Dyslipidemia. Hx of ESRD.  Sonographer:    Aida Pizza RCS Referring Phys: [id number] Kimberlyann Hollar D [facility] IMPRESSIONS  1. Global hypokinesis, worse in the anterior, inferior and septal walls . Left ventricular ejection fraction, by estimation, is 35 to 40%. The left ventricle has moderately decreased function. The left ventricle demonstrates global hypokinesis. The left  ventricular internal cavity size was mildly dilated. There is mild left ventricular hypertrophy. Left ventricular diastolic parameters are consistent with Grade I diastolic dysfunction (impaired relaxation).  2. Right ventricular systolic function is normal. The right ventricular size is normal.  3. Left atrial size was moderately dilated.  4. Mild mitral valve regurgitation.  5. The aortic valve is tricuspid. Aortic valve regurgitation is not visualized. Aortic valve sclerosis/calcification is present, without any evidence of aortic stenosis.  6. The inferior vena cava is normal in size with greater than 50% respiratory variability, suggesting right atrial  pressure of 3 mmHg. FINDINGS  Left Ventricle: Global hypokinesis, worse in the anterior, inferior and septal walls. Left ventricular ejection fraction, by estimation, is 35 to 40%. The left ventricle has moderately decreased function. The left ventricle demonstrates global hypokinesis. 3D ejection fraction reviewed and evaluated as part of the interpretation. Alternate measurement of EF is felt to be most reflective of LV function. The left ventricular internal cavity size was mildly dilated. There is mild left ventricular  hypertrophy. Left ventricular diastolic parameters are consistent with Grade I diastolic dysfunction (impaired relaxation). Right Ventricle: The right ventricular size is normal. Right vetricular wall thickness was not assessed. Right ventricular systolic function is normal. Left Atrium: Left atrial size was moderately dilated. Right Atrium: Right atrial size was normal in size. Pericardium: There is no evidence of pericardial effusion. Mitral Valve: There is mild thickening of the mitral valve leaflet(s). Mild mitral valve regurgitation. Tricuspid Valve: The tricuspid valve is normal in structure. Tricuspid valve regurgitation is trivial. Aortic Valve: The aortic valve is tricuspid. Aortic valve regurgitation is not visualized. Aortic valve sclerosis/calcification is present, without any evidence of aortic stenosis. Pulmonic Valve: The pulmonic valve was normal in structure. Pulmonic valve regurgitation is trivial. Aorta: The aortic root is normal in size and structure. Venous: The inferior vena cava is normal in size with greater than 50% respiratory variability, suggesting right atrial pressure of 3 mmHg. IAS/Shunts: No atrial level shunt detected by color flow Doppler.  LEFT VENTRICLE PLAX 2D LVIDd:         5.60 cm   Diastology LVIDs:         4.40 cm   LV e' medial:    4.68 cm/s LV PW:         1.20 cm   LV E/e' medial:  18.1 LV IVS:        1.00 cm   LV e' lateral:   6.09 cm/s LVOT diam:      2.00 cm   LV E/e' lateral: 13.9 LV SV:  59 LV SV Index:   33 LVOT Area:     3.14 cm                           3D Volume EF:                          3D EF:        41 %                          LV EDV:       161 ml                          LV ESV:       95 ml                          LV SV:        66 ml RIGHT VENTRICLE RV S prime:     17.00 cm/s TAPSE (M-mode): 2.7 cm LEFT ATRIUM             Index        RIGHT ATRIUM           Index LA diam:        4.10 cm 2.28 cm/m   RA Area:     14.00 cm LA Vol (A2C):   55.0 ml 30.64 ml/m  RA Volume:   27.40 ml  15.27 ml/m LA Vol (A4C):   88.6 ml 49.37 ml/m LA Biplane Vol: 70.3 ml 39.17 ml/m  AORTIC VALVE LVOT Vmax:   79.40 cm/s LVOT Vmean:  56.700 cm/s LVOT VTI:    0.189 m  AORTA Ao Root diam: 3.70 cm MITRAL VALVE MV Area (PHT): 4.49 cm     SHUNTS MV Decel Time: 169 msec     Systemic VTI:  0.19 m MV E velocity: 84.90 cm/s   Systemic Diam: 2.00 cm MV A velocity: 121.00 cm/s MV E/A ratio:  0.70 Vina Gull MD Electronically signed by Vina Gull MD Signature Date/Time: 08/12/2023/3:51:28 PM    Final    DG Chest Port 1 View Result Date: 08/12/2023 CLINICAL DATA:  Shortness of breath. EXAM: PORTABLE CHEST 1 VIEW COMPARISON:  10/21/2018 FINDINGS: Heart size is within normal limits. No pleural effusion or edema. Multifocal bilateral airspace opacities are identified. No pneumothorax identified. Visualized osseous structures are unremarkable. IMPRESSION: Multifocal bilateral airspace opacities compatible with multifocal pneumonia. Electronically Signed   By: Waddell Calk M.D.   On: 08/12/2023 05:02        Scheduled Meds:  amLODipine   10 mg Oral Daily   aspirin  EC  81 mg Oral Daily   carvedilol   6.25 mg Oral BID   empagliflozin   10 mg Oral Daily   furosemide  40 mg Intravenous Once   heparin   5,000 Units Subcutaneous Q8H   hydrALAZINE  25 mg Oral TID   insulin  aspart  0-5 Units Subcutaneous QHS   insulin  aspart  0-9 Units Subcutaneous TID WC   insulin   glargine-yfgn  20 Units Subcutaneous QHS   isosorbide mononitrate  15 mg Oral Daily   mycophenolate   1,000 mg Oral BID   predniSONE   5 mg Oral Daily   simvastatin   20 mg Oral Daily   tacrolimus   2 mg Oral BID   Continuous Infusions:  azithromycin  500 mg (08/13/23 9357)   cefTRIAXone  (ROCEPHIN )  IV 2 g (08/13/23 0536)     LOS: 1 day    Time spent: 55 minutes    Jere Vanburen D Maree, DO [facility]  If 7PM-7AM, please contact night-coverage [email or web address] 08/13/2023, 11:30 AM

## 2023-08-14 DIAGNOSIS — J189 Pneumonia, unspecified organism: Secondary | ICD-10-CM | POA: Diagnosis not present

## 2023-08-14 LAB — CBC
HCT: 35.6 % — ABNORMAL LOW (ref 39.0–52.0)
Hemoglobin: 11 g/dL — ABNORMAL LOW (ref 13.0–17.0)
MCH: 26.6 pg (ref 26.0–34.0)
MCHC: 30.9 g/dL (ref 30.0–36.0)
MCV: 86.2 fL (ref 80.0–100.0)
Platelets: 184 10*3/uL (ref 150–400)
RBC: 4.13 MIL/uL — ABNORMAL LOW (ref 4.22–5.81)
RDW: 13.8 % (ref 11.5–15.5)
WBC: 6.6 10*3/uL (ref 4.0–10.5)
nRBC: 0 % (ref 0.0–0.2)

## 2023-08-14 LAB — BASIC METABOLIC PANEL WITH GFR
Anion gap: 9 (ref 5–15)
BUN: 52 mg/dL — ABNORMAL HIGH (ref 6–20)
CO2: 21 mmol/L — ABNORMAL LOW (ref 22–32)
Calcium: 8.6 mg/dL — ABNORMAL LOW (ref 8.9–10.3)
Chloride: 109 mmol/L (ref 98–111)
Creatinine, Ser: 2.83 mg/dL — ABNORMAL HIGH (ref 0.61–1.24)
GFR, Estimated: 25 mL/min — ABNORMAL LOW (ref >60)
Glucose, Bld: 176 mg/dL — ABNORMAL HIGH (ref 70–99)
Potassium: 4.2 mmol/L (ref 3.5–5.1)
Sodium: 139 mmol/L (ref 135–145)

## 2023-08-14 LAB — TSH: TSH: 1.132 u[IU]/mL (ref 0.350–4.500)

## 2023-08-14 LAB — MAGNESIUM: Magnesium: 1.8 mg/dL (ref 1.7–2.4)

## 2023-08-14 LAB — GLUCOSE, CAPILLARY
Glucose-Capillary: 109 mg/dL — ABNORMAL HIGH (ref 70–99)
Glucose-Capillary: 181 mg/dL — ABNORMAL HIGH (ref 70–99)

## 2023-08-14 MED ORDER — GUAIFENESIN-DM 100-10 MG/5ML PO SYRP: 5.0000 mL | ORAL_SOLUTION | ORAL | 0 refills | Status: DC | PRN

## 2023-08-14 MED ORDER — FUROSEMIDE 20 MG PO TABS: 20.0000 mg | ORAL_TABLET | Freq: Every day | ORAL | 1 refills | Status: AC | PRN

## 2023-08-14 MED ORDER — HYDRALAZINE HCL 25 MG PO TABS: 25.0000 mg | ORAL_TABLET | Freq: Three times a day (TID) | ORAL | 2 refills | Status: DC

## 2023-08-14 MED ORDER — ISOSORBIDE MONONITRATE ER 30 MG PO TB24
15.0000 mg | ORAL_TABLET | Freq: Every day | ORAL | 1 refills | Status: DC
Start: 2023-08-15 — End: 2023-09-17

## 2023-08-14 MED ORDER — AMOXICILLIN-POT CLAVULANATE 875-125 MG PO TABS: 1.0000 | ORAL_TABLET | Freq: Two times a day (BID) | ORAL | 0 refills | Status: AC

## 2023-08-14 NOTE — Plan of Care (Signed)

## 2023-08-14 NOTE — Discharge Summary (Signed)
 Physician Discharge Summary  [name] FMW:[id number] DOB: [birth year] DOA: 08/12/2023  PCP: Tobie Suzzane POUR, MD  Admit date: 08/12/2023  Discharge date: 08/14/2023  Admitted From:Home  Disposition:  Home  Recommendations for Outpatient Follow-up:  Follow up with PCP in 1-2 weeks, recommend following up CBC to monitor anemia and consider iron supplementation initiation in the near future once infection resolves Follow-up with cardiology as scheduled 8/1 at 2:30 PM Continue on medications as noted below with addition of hydralazine and Imdur per cardiology Lasix prescribed as needed for worsening edema or shortness of breath.  Patient instructed to remain on Lasix through the weekend given scrotal edema and follow-up ultrasound results with PCP Continue Augmentin for 2 more days to complete 5-day course of treatment for pneumonia Continue other home medications as prior  Home Health: None  Equipment/Devices: None   Discharge Condition:Stable  CODE STATUS: Full  Diet recommendation: Heart Healthy/carb modified  Brief/Interim Summary: [name] is a 58 y.o. male with medical history significant for hypertension, diabetes, and prior renal transplant who presented to the ED with complaints of worsening shortness of breath over the last 2 weeks.  This has been associated with increasing cough over the last 2 days which is especially worse at night when laying flat.  Patient has been admitted for treatment of multifocal pneumonia, but now has new findings of acute HFrEF on 2D echocardiogram was seen by cardiology with medication adjustment noted.  He is not a candidate for heart catheterization or other aggressive measures given his history of renal transplant.  He was given a dose of IV Lasix and is improved today and otherwise euvolemic.  He is now in stable condition for discharge and will follow-up with his PCP regarding his scrotal ultrasound results and remained on some Lasix  throughout the weekend to see if he gets further improvement in scrotal edema.  He has outpatient follow-up scheduled with cardiology in the near future.  Discharge Diagnoses:  Principal Problem:   Multifocal pneumonia Active Problems:   Type 2 diabetes mellitus with other specified complication (HCC)   History of renal transplant   DDD (degenerative disc disease), lumbar   Scrotal mass  Principal discharge diagnosis: Multifocal pneumonia and acute HFrEF.  Discharge Instructions  Discharge Instructions     Diet - low sodium heart healthy   Complete by: As directed    Increase activity slowly   Complete by: As directed       Allergies as of 08/14/2023   No Known Allergies      Medication List     STOP taking these medications    enalapril  5 MG tablet Commonly known as: VASOTEC        TAKE these medications    acetaminophen  500 MG tablet Commonly known as: TYLENOL  Take 500 mg by mouth every 8 (eight) hours as needed for moderate pain (pain score 4-6).   amLODipine  10 MG tablet Commonly known as: NORVASC  Take 10 mg by mouth daily.   amoxicillin-clavulanate 875-125 MG tablet Commonly known as: AUGMENTIN Take 1 tablet by mouth 2 (two) times daily for 2 days.   aspirin  EC 81 MG tablet Take 81 mg by mouth daily.   BD Pen Needle Nano U/F 32G X 4 MM Misc Generic drug: Insulin  Pen Needle 1 each by Does not apply route 4 (four) times daily.   carvedilol  6.25 MG tablet Commonly known as: COREG  Take 6.25 mg by mouth 2 (two) times daily.   furosemide 20 MG tablet Commonly known  as: Lasix Take 1 tablet (20 mg total) by mouth daily as needed for fluid or edema.   guaiFENesin -dextromethorphan  100-10 MG/5ML syrup Commonly known as: ROBITUSSIN DM Take 5 mLs by mouth every 4 (four) hours as needed for cough.   hydrALAZINE 25 MG tablet Commonly known as: APRESOLINE Take 1 tablet (25 mg total) by mouth 3 (three) times daily.   isosorbide mononitrate 30 MG 24 hr  tablet Commonly known as: IMDUR Take 0.5 tablets (15 mg total) by mouth daily. Start taking on: August 15, 2023   Jardiance  10 MG Tabs tablet Generic drug: empagliflozin  Take 10 mg by mouth daily.   mycophenolate  250 MG capsule Commonly known as: CELLCEPT  Take 1,000 mg by mouth 2 (two) times daily.   oxyCODONE  5 MG immediate release tablet Commonly known as: Oxy IR/ROXICODONE  Take 5 mg by mouth every 6 (six) hours as needed.   predniSONE  5 MG tablet Commonly known as: DELTASONE  Take 5 mg by mouth daily.   simvastatin  20 MG tablet Commonly known as: ZOCOR  Take 20 mg by mouth daily.   tacrolimus  1 MG capsule Commonly known as: PROGRAF  Take 2 mg by mouth 2 (two) times daily.   tetrahydrozoline-zinc 0.05-0.25 % ophthalmic solution Commonly known as: VISINE-AC Place 2 drops into both eyes 3 (three) times daily as needed. Dry/Red Eyes   Tresiba  FlexTouch 100 UNIT/ML FlexTouch Pen Generic drug: insulin  degludec Inject 20 Units into the skin at bedtime.   True Metrix Blood Glucose Test test strip Generic drug: glucose blood Use as instructed        Follow-up Information     Johnson Laymon HERO, PA-C Follow up on 09/17/2023.   Specialties: Cardiology, Cardiology Why: [facility]iology Hospital Follow-up on 09/17/2023 at 2:30 PM. Contact information: [address] [place] [address] [phone]         Tobie Suzzane POUR, MD Follow up in 1 week(s).   Specialty: Internal Medicine Contact information: [address] [place] [address] [phone]                No Known Allergies  Consultations: Cardiology   Procedures/Studies: ECHOCARDIOGRAM COMPLETE Result Date: 08/12/2023    ECHOCARDIOGRAM REPORT   Patient Name:   [name] Date of Exam: 08/12/2023 Medical Rec #:  [id number]     Height:       65.5 in Accession #:    [id number]    Weight:       157.0 lb Date of Birth:  [birth year]     BSA:          1.795 m Patient Age:    58 years      BP:            174/100 mmHg Patient Gender: M             HR:           77 bpm. Exam Location:  [facility] Procedure: 2D Echo, 3D Echo, Cardiac Doppler and Color Doppler (Both Spectral            and Color Flow Doppler were utilized during procedure). Indications:    Dyspnea R06.00  History:        Patient has prior history of Echocardiogram examinations, most                 recent 09/24/2003. Risk Factors:Hypertension, Diabetes and                 Dyslipidemia. Hx of ESRD.  Sonographer:  Aida Pizza RCS Referring Phys: [id number] ADRON BIRCH [facility] IMPRESSIONS  1. Global hypokinesis, worse in the anterior, inferior and septal walls . Left ventricular ejection fraction, by estimation, is 35 to 40%. The left ventricle has moderately decreased function. The left ventricle demonstrates global hypokinesis. The left  ventricular internal cavity size was mildly dilated. There is mild left ventricular hypertrophy. Left ventricular diastolic parameters are consistent with Grade I diastolic dysfunction (impaired relaxation).  2. Right ventricular systolic function is normal. The right ventricular size is normal.  3. Left atrial size was moderately dilated.  4. Mild mitral valve regurgitation.  5. The aortic valve is tricuspid. Aortic valve regurgitation is not visualized. Aortic valve sclerosis/calcification is present, without any evidence of aortic stenosis.  6. The inferior vena cava is normal in size with greater than 50% respiratory variability, suggesting right atrial pressure of 3 mmHg. FINDINGS  Left Ventricle: Global hypokinesis, worse in the anterior, inferior and septal walls. Left ventricular ejection fraction, by estimation, is 35 to 40%. The left ventricle has moderately decreased function. The left ventricle demonstrates global hypokinesis. 3D ejection fraction reviewed and evaluated as part of the interpretation. Alternate measurement of EF is felt to be most reflective of LV function. The left ventricular internal cavity size  was mildly dilated. There is mild left ventricular  hypertrophy. Left ventricular diastolic parameters are consistent with Grade I diastolic dysfunction (impaired relaxation). Right Ventricle: The right ventricular size is normal. Right vetricular wall thickness was not assessed. Right ventricular systolic function is normal. Left Atrium: Left atrial size was moderately dilated. Right Atrium: Right atrial size was normal in size. Pericardium: There is no evidence of pericardial effusion. Mitral Valve: There is mild thickening of the mitral valve leaflet(s). Mild mitral valve regurgitation. Tricuspid Valve: The tricuspid valve is normal in structure. Tricuspid valve regurgitation is trivial. Aortic Valve: The aortic valve is tricuspid. Aortic valve regurgitation is not visualized. Aortic valve sclerosis/calcification is present, without any evidence of aortic stenosis. Pulmonic Valve: The pulmonic valve was normal in structure. Pulmonic valve regurgitation is trivial. Aorta: The aortic root is normal in size and structure. Venous: The inferior vena cava is normal in size with greater than 50% respiratory variability, suggesting right atrial pressure of 3 mmHg. IAS/Shunts: No atrial level shunt detected by color flow Doppler.  LEFT VENTRICLE PLAX 2D LVIDd:         5.60 cm   Diastology LVIDs:         4.40 cm   LV e' medial:    4.68 cm/s LV PW:         1.20 cm   LV E/e' medial:  18.1 LV IVS:        1.00 cm   LV e' lateral:   6.09 cm/s LVOT diam:     2.00 cm   LV E/e' lateral: 13.9 LV SV:         59 LV SV Index:   33 LVOT Area:     3.14 cm                           3D Volume EF:                          3D EF:        41 %  LV EDV:       161 ml                          LV ESV:       95 ml                          LV SV:        66 ml RIGHT VENTRICLE RV S prime:     17.00 cm/s TAPSE (M-mode): 2.7 cm LEFT ATRIUM             Index        RIGHT ATRIUM           Index LA diam:        4.10 cm 2.28 cm/m    RA Area:     14.00 cm LA Vol (A2C):   55.0 ml 30.64 ml/m  RA Volume:   27.40 ml  15.27 ml/m LA Vol (A4C):   88.6 ml 49.37 ml/m LA Biplane Vol: 70.3 ml 39.17 ml/m  AORTIC VALVE LVOT Vmax:   79.40 cm/s LVOT Vmean:  56.700 cm/s LVOT VTI:    0.189 m  AORTA Ao Root diam: 3.70 cm MITRAL VALVE MV Area (PHT): 4.49 cm     SHUNTS MV Decel Time: 169 msec     Systemic VTI:  0.19 m MV E velocity: 84.90 cm/s   Systemic Diam: 2.00 cm MV A velocity: 121.00 cm/s MV E/A ratio:  0.70 Vina Gull MD Electronically signed by Vina Gull MD Signature Date/Time: 08/12/2023/3:51:28 PM    Final    DG Chest Port 1 View Result Date: 08/12/2023 CLINICAL DATA:  Shortness of breath. EXAM: PORTABLE CHEST 1 VIEW COMPARISON:  10/21/2018 FINDINGS: Heart size is within normal limits. No pleural effusion or edema. Multifocal bilateral airspace opacities are identified. No pneumothorax identified. Visualized osseous structures are unremarkable. IMPRESSION: Multifocal bilateral airspace opacities compatible with multifocal pneumonia. Electronically Signed   By: Waddell Calk M.D.   On: 08/12/2023 05:02     Discharge Exam: Vitals:   08/14/23 0424 08/14/23 0842  BP: (!) 135/91 (!) 161/101  Pulse: 79   Resp: 18   Temp: 98.6 F (37 C)   SpO2: 95%    Vitals:   08/13/23 1947 08/13/23 2125 08/14/23 0424 08/14/23 0842  BP: 139/81 (!) 148/87 (!) 135/91 (!) 161/101  Pulse: 86  79   Resp: 18  18   Temp: 98.6 F (37 C)  98.6 F (37 C)   TempSrc: Oral  Oral   SpO2: 95%  95%   Weight:      Height:        General: Pt is alert, awake, not in acute distress Cardiovascular: RRR, S1/S2 +, no rubs, no gallops Respiratory: CTA bilaterally, no wheezing, no rhonchi Abdominal: Soft, NT, ND, bowel sounds + Extremities: no edema, no cyanosis    The results of significant diagnostics from this hospitalization (including imaging, microbiology, ancillary and laboratory) are listed below for reference.     Microbiology: No results  found for this or any previous visit (from the past 240 hours).   Labs: BNP (last 3 results) Recent Labs    08/12/23 0359  BNP 306.0*   Basic Metabolic Panel: Recent Labs  Lab 08/12/23 0359 08/13/23 0446 08/14/23 0338  NA 140 138 139  K 4.5 4.6 4.2  CL 111 109 109  CO2 22 19* 21*  GLUCOSE 95  175* 176*  BUN 33* 39* 52*  CREATININE 2.39* 2.61* 2.83*  CALCIUM 8.6* 8.9 8.6*  MG  --  1.8 1.8   Liver Function Tests: Recent Labs  Lab 08/12/23 0359  AST 19  ALT 12  ALKPHOS 77  BILITOT 0.3  PROT 6.8  ALBUMIN 3.1*   No results for input(s): LIPASE, AMYLASE in the last 168 hours. No results for input(s): AMMONIA in the last 168 hours. CBC: Recent Labs  Lab 08/12/23 0359 08/13/23 0446 08/14/23 0338  WBC 6.3 6.6 6.6  HGB 11.9* 11.6* 11.0*  HCT 38.5* 37.7* 35.6*  MCV 88.1 86.3 86.2  PLT 171 175 184   Cardiac Enzymes: No results for input(s): CKTOTAL, CKMB, CKMBINDEX, TROPONINI in the last 168 hours. BNP: Invalid input(s): POCBNP CBG: Recent Labs  Lab 08/13/23 1138 08/13/23 1648 08/13/23 2106 08/14/23 0723 08/14/23 1106  GLUCAP 145* 183* 376* 109* 181*   D-Dimer No results for input(s): DDIMER in the last 72 hours. Hgb A1c No results for input(s): HGBA1C in the last 72 hours. Lipid Profile No results for input(s): CHOL, HDL, LDLCALC, TRIG, CHOLHDL, LDLDIRECT in the last 72 hours. Thyroid function studies Recent Labs    08/14/23 0338  TSH 1.132   Anemia work up Recent Labs    08/12/23 0834  VITAMINB12 222  FOLATE 10.2  FERRITIN 117  TIBC 246*  IRON 34*  RETICCTPCT 1.9   Urinalysis    Component Value Date/Time   COLORURINE YELLOW 10/22/2018 0036   APPEARANCEUR CLEAR 10/22/2018 0036   LABSPEC 1.023 10/22/2018 0036   PHURINE 5.0 10/22/2018 0036   GLUCOSEU >=500 (A) 10/22/2018 0036   HGBUR SMALL (A) 10/22/2018 0036   BILIRUBINUR NEGATIVE 10/22/2018 0036   KETONESUR 5 (A) 10/22/2018 0036   PROTEINUR 100 (A)  10/22/2018 0036   UROBILINOGEN 0.2 11/14/2014 2134   NITRITE NEGATIVE 10/22/2018 0036   LEUKOCYTESUR NEGATIVE 10/22/2018 0036   Sepsis Labs Recent Labs  Lab 08/12/23 0359 08/13/23 0446 08/14/23 0338  WBC 6.3 6.6 6.6   Microbiology No results found for this or any previous visit (from the past 240 hours).   Time coordinating discharge: 35 minutes  SIGNED:   Adron JONETTA Fairly, DO [facility] 08/14/2023, 11:12 AM  If 7PM-7AM, please contact night-coverage [email or web address]

## 2023-08-14 NOTE — Progress Notes (Signed)
   08/14/23 0800  ReDS Vest / Clip  Station Marker A  Ruler Value 30  ReDS Value Range 36 - 40  ReDS Actual Value 36

## 2023-08-16 ENCOUNTER — Encounter: Payer: Self-pay | Admitting: *Deleted

## 2023-08-16 ENCOUNTER — Telehealth: Payer: Self-pay

## 2023-08-16 ENCOUNTER — Ambulatory Visit: Payer: Self-pay | Admitting: Internal Medicine

## 2023-08-16 NOTE — Telephone Encounter (Signed)
 This encounter was created in error - please disregard.

## 2023-08-16 NOTE — Transitions of Care (Post Inpatient/ED Visit) (Signed)
 08/16/2023  Name: [name] MRN: [id number] DOB: 05-19-1965  Today's TOC FU Call Status: Today's TOC FU Call Status:: Successful TOC FU Call Completed TOC FU Call Complete Date: 08/16/23 Patient's Name and Date of Birth confirmed.  Transition Care Management Follow-up Telephone Call Date of Discharge: 08/14/23 Discharge Facility: [facility] Type of Discharge: Inpatient Admission Primary Inpatient Discharge Diagnosis:: Multifocal pneumonia How have you been since you were released from the hospital?: Better (Feeling weak,  feels like breathing is better.) Any questions or concerns?: No  Items Reviewed: Did you receive and understand the discharge instructions provided?: Yes Medications obtained,verified, and reconciled?: Yes (Medications Reviewed) Any new allergies since your discharge?: No Dietary orders reviewed?: Yes Type of Diet Ordered:: low salt and carb modified Do you have support at home?: Yes People in Home [RPT]: spouse Name of Support/Comfort Primary Source: Jesus  Medications Reviewed Today: Medications Reviewed Today     Reviewed by Rumalda Alan PENNER, RN (Registered Nurse) on 08/16/23 at [phone]  Med List Status: <None>   Medication Order Taking? Sig Documenting Provider Last Dose Status Informant  acetaminophen  (TYLENOL ) 500 MG tablet [id number] Yes Take 500 mg by mouth every 8 (eight) hours as needed for moderate pain (pain score 4-6). [provider]  Active Spouse/Significant Other, Pharmacy Records, Self  amLODipine  (NORVASC ) 10 MG tablet [id number] Yes Take 10 mg by mouth daily. [provider]  Active Spouse/Significant Other, Pharmacy Records, Self  amoxicillin-clavulanate (AUGMENTIN) 875-125 MG tablet [id number] Yes Take 1 tablet by mouth 2 (two) times daily for 2 days. Maree Bracken D, DO  Active   aspirin  EC 81 MG tablet [id number] Yes Take 81 mg by mouth daily. [provider]  Active Spouse/Significant Other, Pharmacy Records,  Self  carvedilol  (COREG ) 6.25 MG tablet [id number] Yes Take 6.25 mg by mouth 2 (two) times daily. [provider]  Active Spouse/Significant Other, Pharmacy Records, Self  furosemide (LASIX) 20 MG tablet [id number] Yes Take 1 tablet (20 mg total) by mouth daily as needed for fluid or edema.  Patient taking differently: Take 1 tablet (20 mg total) by mouth daily as needed for fluid or edema.   Maree, Pratik D, DO  Active   glucose blood (TRUE METRIX BLOOD GLUCOSE TEST) test strip [id number] Yes Use as instructed Nida, Gebreselassie W, MD  Active Spouse/Significant Other, Pharmacy Records, Self  guaiFENesin -dextromethorphan  (ROBITUSSIN DM) 100-10 MG/5ML syrup [id number] Yes Take 5 mLs by mouth every 4 (four) hours as needed for cough. Maree, Pratik D, DO  Active   hydrALAZINE (APRESOLINE) 25 MG tablet [id number] Yes Take 1 tablet (25 mg total) by mouth 3 (three) times daily. Maree, Pratik D, DO  Active   insulin  degludec (TRESIBA  FLEXTOUCH) 100 UNIT/ML FlexTouch Pen [id number] Yes Inject 20 Units into the skin at bedtime. Nida, Gebreselassie W, MD  Active Spouse/Significant Other, Pharmacy Records, Self  Insulin  Pen Needle (BD PEN NEEDLE NANO U/F) 32G X 4 MM MISC [id number] Yes 1 each by Does not apply route 4 (four) times daily. Nida, Gebreselassie W, MD  Active Spouse/Significant Other, Pharmacy Records, Self  isosorbide mononitrate (IMDUR) 30 MG 24 hr tablet [id number] Yes Take 0.5 tablets (15 mg total) by mouth daily. Maree, Pratik D, DO  Active   JARDIANCE  10 MG TABS tablet [id number] Yes Take 10 mg by mouth daily. [provider]  Active Spouse/Significant Other, Pharmacy Records, Self  mycophenolate  (CELLCEPT ) 250 MG capsule [id number] Yes Take 1,000 mg by mouth 2 (two) times daily. [provider]  Active Spouse/Significant Other, Pharmacy Records, Self  oxyCODONE  (OXY IR/ROXICODONE ) 5 MG immediate release tablet [id number] Yes Take 5 mg by mouth every 6 (six) hours as needed. [provider]  Active Spouse/Significant Other, Pharmacy Records, Self  predniSONE  (DELTASONE ) 5 MG tablet [id number] Yes Take 5 mg by mouth daily. [provider]  Active Spouse/Significant Other, Pharmacy Records, Self  simvastatin  (ZOCOR ) 20 MG tablet [id number] Yes Take 20 mg by mouth daily. [provider]  Active Spouse/Significant Other, Pharmacy Records, Self  tacrolimus  (PROGRAF ) 1 MG capsule [id number] Yes Take 2 mg by mouth 2 (two) times daily. [provider]  Active Spouse/Significant Other, Pharmacy Records, Self  tetrahydrozoline-zinc (VISINE-AC) 0.05-0.25 % ophthalmic solution [id number] Yes Place 2 drops into both eyes 3 (three) times daily as needed. Dry/Red Eyes [provider]  Active Spouse/Significant Other, Pharmacy Records, Self  Med List Note Broadus Reda CROME, RN 08/02/23 1348): UDS 03/09/23 Mr 05-24-23 08-02-23 PA request for Oxycodone  submitted per C[facility] Key [id number] Oxycodone  approved through 01-29-24            Home Care and Equipment/Supplies: Were Home Health Services Ordered?: No Any new equipment or medical supplies ordered?: No  Functional Questionnaire: Do you need assistance with bathing/showering or dressing?: No Do you need assistance with meal preparation?: No Do you need assistance with eating?: No Do you have difficulty maintaining continence: No Do you need assistance with getting out of bed/getting out of a chair/moving?: No Do you have difficulty managing or taking your medications?: No  Follow up appointments reviewed: PCP Follow-up appointment confirmed?: No (will call and make an appointment today) MD Provider Line Number:[phone] Given: No Specialist Hospital Follow-up appointment confirmed?: Yes Date of Specialist follow-up appointment?: 09/14/23 Follow-Up Specialty Provider:: Dr. 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  Reports that he is doing well. States that he feels a little weak. Denies cough or wheezing.  Reports taking all his medications as prescribed.   Denies any concerns today.   Interventions: encouraged patient to call MD office and make a follow up appointment.  ( States his wife is doing this) Reviewed importance of finishing antibiotics. Encouraged patient to discuss anemia with PCP.  Reviewed importance of hydration and following his diet. Encouraged daily weights Reviewed pending urology appointment.  Reviewed and offered 30 day TOC program and patient decline.  Provided my contact information for patient to call me back if needed.  Alan Ee, RN, BSN, CEN [employer]- Transition of Care Team.  [facility] [phone]

## 2023-08-19 ENCOUNTER — Encounter: Payer: Self-pay | Admitting: Internal Medicine

## 2023-08-19 ENCOUNTER — Ambulatory Visit: Admitting: Internal Medicine

## 2023-08-19 VITALS — BP 125/74 | HR 82 | Ht 64.0 in | Wt 160.2 lb

## 2023-08-19 DIAGNOSIS — N433 Hydrocele, unspecified: Secondary | ICD-10-CM

## 2023-08-19 DIAGNOSIS — J189 Pneumonia, unspecified organism: Secondary | ICD-10-CM | POA: Diagnosis not present

## 2023-08-19 DIAGNOSIS — I10 Essential (primary) hypertension: Secondary | ICD-10-CM | POA: Diagnosis not present

## 2023-08-19 DIAGNOSIS — Z09 Encounter for follow-up examination after completed treatment for conditions other than malignant neoplasm: Secondary | ICD-10-CM

## 2023-08-19 DIAGNOSIS — Z794 Long term (current) use of insulin: Secondary | ICD-10-CM

## 2023-08-19 DIAGNOSIS — E1169 Type 2 diabetes mellitus with other specified complication: Secondary | ICD-10-CM

## 2023-08-19 DIAGNOSIS — G894 Chronic pain syndrome: Secondary | ICD-10-CM

## 2023-08-19 DIAGNOSIS — I5020 Unspecified systolic (congestive) heart failure: Secondary | ICD-10-CM | POA: Diagnosis not present

## 2023-08-19 DIAGNOSIS — M51362 Other intervertebral disc degeneration, lumbar region with discogenic back pain and lower extremity pain: Secondary | ICD-10-CM

## 2023-08-19 DIAGNOSIS — N186 End stage renal disease: Secondary | ICD-10-CM

## 2023-08-19 MED ORDER — AMLODIPINE BESYLATE 10 MG PO TABS
10.0000 mg | ORAL_TABLET | Freq: Every day | ORAL | 3 refills | Status: AC
  Filled 2023-12-20 (×2): qty 90, 90d supply, fill #0

## 2023-08-19 MED ORDER — CARVEDILOL 6.25 MG PO TABS
6.2500 mg | ORAL_TABLET | Freq: Two times a day (BID) | ORAL | 3 refills | Status: DC
  Filled 2023-12-20 (×2): qty 180, 90d supply, fill #0

## 2023-08-19 MED ORDER — OXYCODONE HCL 5 MG PO TABS
5.0000 mg | ORAL_TABLET | Freq: Four times a day (QID) | ORAL | 0 refills | Status: DC | PRN
Start: 2023-08-30 — End: 2023-09-30

## 2023-08-19 NOTE — Assessment & Plan Note (Signed)
 Recent echocardiogram reviewed - LVEF 35-40% Has been placed on hydralazine and Imdur Needs to continue amlodipine  and Coreg  Enalapril  recently discontinued, was given Lasix 20 mg QD as needed Appears euvolemic today Follow up with cardiology

## 2023-08-19 NOTE — Progress Notes (Signed)
 Established Patient Office Visit  Subjective:  Patient ID: [name], male    DOB: 05-05-1965  Age: 58 y.o. MRN: [id number]  CC:  Chief Complaint  Patient presents with   Follow-up    Hospital f/u, pt reports feeling weak, reports working a lot as well.    HPI [name] is a 58 y.o. male with past medical history of ESRD s/p renal transplant (2007), HTN, type II DM, HLD and lumbar DDD who presents for follow-up after recent hospitalization.  He presented to the ED on 08/12/23 with complaints of worsening shortness of breath over the last 2 weeks.  This had been associated with increasing cough over the last 2 days which was especially worse at night when laying flat.  Patient was admitted for treatment of multifocal pneumonia, but later had new findings of acute HFrEF on 2D echocardiogram, was seen by cardiology with medication adjustment noted. He was given a dose of IV Lasix and improved later.  He is placed on Lasix 20 mg once daily PRN.  He is also placed on hydralazine 25 mg 3 times daily and Imdur 15 mg QD.  His enalapril  was discontinued during recent hospitalization.  Of note, he has continued to take enalapril , but has run out of amlodipine  and Coreg .  He reports feeling weak today.  His BP is WNL.  Denies any recent worsening of cough or dyspnea since being discharged from the hospital.  Denies any fever or chills.  Past Medical History:  Diagnosis Date   DDD (degenerative disc disease), lumbar    Diabetes mellitus    Hypertension    Polycystic kidney disease     Past Surgical History:  Procedure Laterality Date   NEPHRECTOMY TRANSPLANTED ORGAN      Family History  Problem Relation Age of Onset   Diabetes Mother    Kidney disease Mother    Stroke Mother    Kidney disease Father    Hypertension Father    Stroke Father    Heart disease Father     Social History   Socioeconomic History   Marital status: Single    Spouse name: Not on file   Number of  children: Not on file   Years of education: Not on file   Highest education level: Not on file  Occupational History   Not on file  Tobacco Use   Smoking status: Never   Smokeless tobacco: Never  Substance and Sexual Activity   Alcohol use: No    Comment: rarely   Drug use: No   Sexual activity: Not Currently  Other Topics Concern   Not on file  Social History Narrative   Not on file   Social Drivers of Health   Financial Resource Strain: Not on file  Food Insecurity: No Food Insecurity (08/16/2023)   Hunger Vital Sign    Worried About Running Out of Food in the Last Year: Never true    Ran Out of Food in the Last Year: Never true  Transportation Needs: No Transportation Needs (08/16/2023)   PRAPARE - Administrator, Civil Service (Medical): No    Lack of Transportation (Non-Medical): No  Physical Activity: Not on file  Stress: Not on file  Social Connections: Not on file  Intimate Partner Violence: Not At Risk (08/16/2023)   Humiliation, Afraid, Rape, and Kick questionnaire    Fear of Current or Ex-Partner: No    Emotionally Abused: No    Physically Abused: No  Sexually Abused: No    Outpatient Medications Prior to Visit  Medication Sig Dispense Refill   acetaminophen  (TYLENOL ) 500 MG tablet Take 500 mg by mouth every 8 (eight) hours as needed for moderate pain (pain score 4-6).     aspirin  EC 81 MG tablet Take 81 mg by mouth daily.     furosemide (LASIX) 20 MG tablet Take 1 tablet (20 mg total) by mouth daily as needed for fluid or edema. (Patient taking differently: Take 1 tablet (20 mg total) by mouth daily as needed for fluid or edema.) 30 tablet 1   glucose blood (TRUE METRIX BLOOD GLUCOSE TEST) test strip Use as instructed 100 each 2   guaiFENesin -dextromethorphan  (ROBITUSSIN DM) 100-10 MG/5ML syrup Take 5 mLs by mouth every 4 (four) hours as needed for cough. 118 mL 0   hydrALAZINE (APRESOLINE) 25 MG tablet Take 1 tablet (25 mg total) by mouth 3  (three) times daily. 90 tablet 2   insulin  degludec (TRESIBA  FLEXTOUCH) 100 UNIT/ML FlexTouch Pen Inject 20 Units into the skin at bedtime. 15 mL 1   Insulin  Pen Needle (BD PEN NEEDLE NANO U/F) 32G X 4 MM MISC 1 each by Does not apply route 4 (four) times daily. 100 each 1   isosorbide mononitrate (IMDUR) 30 MG 24 hr tablet Take 0.5 tablets (15 mg total) by mouth daily. 60 tablet 1   JARDIANCE  10 MG TABS tablet Take 10 mg by mouth daily.     mycophenolate  (CELLCEPT ) 250 MG capsule Take 1,000 mg by mouth 2 (two) times daily.     predniSONE  (DELTASONE ) 5 MG tablet Take 5 mg by mouth daily.     simvastatin  (ZOCOR ) 20 MG tablet Take 20 mg by mouth daily.     tacrolimus  (PROGRAF ) 1 MG capsule Take 2 mg by mouth 2 (two) times daily.     tetrahydrozoline-zinc (VISINE-AC) 0.05-0.25 % ophthalmic solution Place 2 drops into both eyes 3 (three) times daily as needed. Dry/Red Eyes     amLODipine  (NORVASC ) 10 MG tablet Take 10 mg by mouth daily.     carvedilol  (COREG ) 6.25 MG tablet Take 6.25 mg by mouth 2 (two) times daily.     oxyCODONE  (OXY IR/ROXICODONE ) 5 MG immediate release tablet Take 5 mg by mouth every 6 (six) hours as needed.     No facility-administered medications prior to visit.    No Known Allergies  ROS Review of Systems  Constitutional:  Negative for chills and fever.  HENT:  Negative for congestion and sore throat.   Eyes:  Negative for pain and discharge.  Respiratory:  Negative for cough and shortness of breath.   Cardiovascular:  Negative for chest pain and palpitations.  Gastrointestinal:  Negative for diarrhea, nausea and vomiting.  Endocrine: Negative for polydipsia and polyuria.  Genitourinary:  Positive for scrotal swelling. Negative for dysuria and hematuria.  Musculoskeletal:  Positive for back pain. Negative for neck pain and neck stiffness.  Skin:  Negative for rash.  Neurological:  Negative for dizziness and weakness.  Psychiatric/Behavioral:  Negative for agitation  and behavioral problems.       Objective:    Physical Exam Vitals reviewed.  Constitutional:      General: He is not in acute distress.    Appearance: He is not diaphoretic.  HENT:     Head: Normocephalic and atraumatic.     Nose: Nose normal.     Mouth/Throat:     Mouth: Mucous membranes are moist.  Eyes:  General: No scleral icterus.    Extraocular Movements: Extraocular movements intact.  Cardiovascular:     Rate and Rhythm: Normal rate and regular rhythm.     Heart sounds: Normal heart sounds. No murmur heard. Pulmonary:     Breath sounds: No wheezing or rales.  Genitourinary:    Testes:        Right: Testicular hydrocele present.        Left: Testicular hydrocele present.  Musculoskeletal:     Cervical back: Neck supple. No tenderness.     Right lower leg: No edema.     Left lower leg: No edema.  Skin:    General: Skin is warm.     Findings: No rash.  Neurological:     General: No focal deficit present.     Mental Status: He is alert and oriented to person, place, and time.  Psychiatric:        Mood and Affect: Mood normal.        Behavior: Behavior normal.     BP 125/74   Pulse 82   Ht 5' 4 (1.626 m)   Wt 160 lb 3.2 oz (72.7 kg)   SpO2 93%   BMI 27.50 kg/m  Wt Readings from Last 3 Encounters:  08/19/23 160 lb 3.2 oz (72.7 kg)  08/13/23 159 lb 9.8 oz (72.4 kg)  07/14/23 157 lb (71.2 kg)    Lab Results  Component Value Date   TSH 1.132 08/14/2023   Lab Results  Component Value Date   WBC 6.6 08/14/2023   HGB 11.0 (L) 08/14/2023   HCT 35.6 (L) 08/14/2023   MCV 86.2 08/14/2023   PLT 184 08/14/2023   Lab Results  Component Value Date   NA 139 08/14/2023   K 4.2 08/14/2023   CO2 21 (L) 08/14/2023   GLUCOSE 176 (H) 08/14/2023   BUN 52 (H) 08/14/2023   CREATININE 2.83 (H) 08/14/2023   BILITOT 0.3 08/12/2023   ALKPHOS 77 08/12/2023   AST 19 08/12/2023   ALT 12 08/12/2023   PROT 6.8 08/12/2023   ALBUMIN 3.1 (L) 08/12/2023   CALCIUM  8.6 (L) 08/14/2023   ANIONGAP 9 08/14/2023   EGFR 24.0 06/29/2023   No results found for: CHOL No results found for: HDL No results found for: LDLCALC No results found for: TRIG No results found for: CHOLHDL Lab Results  Component Value Date   HGBA1C 8.7 06/29/2023      Assessment & Plan:   Problem List Items Addressed This Visit       Cardiovascular and Mediastinum   Essential hypertension   BP Readings from Last 1 Encounters:  08/19/23 125/74    Well-controlled with amlodipine  10 mg QD, Coreg  6.25 mg BID Has been placed on hydralazine 25 mg 3 times daily and Imdur 15 mg QD DCed enalapril  20 mg QD recently from hospitalization Counseled for compliance with the medications Advised DASH diet and moderate exercise/walking, at least 150 mins/week       Relevant Medications   carvedilol  (COREG ) 6.25 MG tablet   amLODipine  (NORVASC ) 10 MG tablet   HFrEF (heart failure with reduced ejection fraction) (HCC)   Recent echocardiogram reviewed - LVEF 35-40% Has been placed on hydralazine and Imdur Needs to continue amlodipine  and Coreg  Enalapril  recently discontinued, was given Lasix 20 mg QD as needed Appears euvolemic today Follow up with cardiology      Relevant Medications   carvedilol  (COREG ) 6.25 MG tablet   amLODipine  (NORVASC ) 10 MG tablet  Respiratory   Multifocal pneumonia - Primary   Was treated with IV antibiotics during recent hospitalization Check x-ray of chest after 4 weeks        Endocrine   Type 2 diabetes mellitus with other specified complication (HCC)   Lab Results  Component Value Date   HGBA1C 8.7 06/29/2023   Uncontrolled Associated with HTN, HLD, CKD On Tresiba  20 units nightly and Jardiance  10 mg QD, followed by endocrinology Advised to follow diabetic diet On statin and ACEi, needs to be compliant to statin F/u CMP and lipid panel Diabetic eye exam: Advised to follow up with Ophthalmology for diabetic eye exam         Musculoskeletal and Integument   DDD (degenerative disc disease), lumbar   Has chronic low back pain Unable to take oral NSAIDs On oxycodone  5 mg 4 times daily as needed for chronic low back pain Avoid heavy lifting and frequent bending Heating pad and/or back brace as needed Has completed PT      Relevant Medications   oxyCODONE  (OXY IR/ROXICODONE ) 5 MG immediate release tablet (Start on 08/30/2023)     Genitourinary   ESRD (end stage renal disease) (HCC)   Due to polycystic kidney disease and type II DM, s/p renal transplant in 2007 On Jardiance  for proteinuria Recently DCed Enalapril  Followed by nephrology      Hydrocele   Likely hydrocele, checked US  of scrotum Referred to urology Advised to use scrotal support        Other   Chronic pain syndrome   Due to lumbar DDD Unable to take oral NSAIDs due to ESRD On oxycodone  5 mg 4 times daily as needed - will manage chronic pain medicine for now, if he has uncontrolled pain, will refer to pain management again      Relevant Medications   oxyCODONE  (OXY IR/ROXICODONE ) 5 MG immediate release tablet (Start on 08/30/2023)   Hospital discharge follow-up   Hospital chart reviewed, including discharge summary Medications reconciled and reviewed with the patient in detail       Meds ordered this encounter  Medications   carvedilol  (COREG ) 6.25 MG tablet    Sig: Take 1 tablet (6.25 mg total) by mouth 2 (two) times daily.    Dispense:  180 tablet    Refill:  3   amLODipine  (NORVASC ) 10 MG tablet    Sig: Take 1 tablet (10 mg total) by mouth daily.    Dispense:  90 tablet    Refill:  3   oxyCODONE  (OXY IR/ROXICODONE ) 5 MG immediate release tablet    Sig: Take 1 tablet (5 mg total) by mouth every 6 (six) hours as needed.    Dispense:  120 tablet    Refill:  0    Follow-up: Return if symptoms worsen or fail to improve.    Suzzane MARLA Blanch, MD

## 2023-08-19 NOTE — Assessment & Plan Note (Signed)
 Was treated with IV antibiotics during recent hospitalization Check x-ray of chest after 4 weeks

## 2023-08-19 NOTE — Patient Instructions (Addendum)
 Please schedule Diabetic Eye Exam.  Please stop taking Enalapril .  Please start taking Amlodipine  and Coreg  as prescribed.  Please continue to take medications as prescribed.  Please continue to follow low salt diet and ambulate as tolerated.

## 2023-08-19 NOTE — Assessment & Plan Note (Signed)
 Has chronic low back pain Unable to take oral NSAIDs On oxycodone  5 mg 4 times daily as needed for chronic low back pain Avoid heavy lifting and frequent bending Heating pad and/or back brace as needed Has completed PT

## 2023-08-19 NOTE — Assessment & Plan Note (Signed)
 Hospital chart reviewed, including discharge summary Medications reconciled and reviewed with the patient in detail

## 2023-08-19 NOTE — Assessment & Plan Note (Signed)
 Lab Results  Component Value Date   HGBA1C 8.7 06/29/2023   Uncontrolled Associated with HTN, HLD, CKD On Tresiba  20 units nightly and Jardiance 10 mg QD, followed by endocrinology Advised to follow diabetic diet On statin and ACEi, needs to be compliant to statin F/u CMP and lipid panel Diabetic eye exam: Advised to follow up with Ophthalmology for diabetic eye exam

## 2023-08-19 NOTE — Assessment & Plan Note (Signed)
 Likely hydrocele, checked US  of scrotum Referred to urology Advised to use scrotal support

## 2023-08-19 NOTE — Addendum Note (Signed)
 Addended byBETHA TOBIE DOWNS on: 08/19/2023 06:08 PM   Modules accepted: Level of Service

## 2023-08-19 NOTE — Assessment & Plan Note (Addendum)
 BP Readings from Last 1 Encounters:  08/19/23 125/74    Well-controlled with amlodipine  10 mg QD, Coreg  6.25 mg BID Has been placed on hydralazine 25 mg 3 times daily and Imdur 15 mg QD DCed enalapril  20 mg QD recently from hospitalization Counseled for compliance with the medications Advised DASH diet and moderate exercise/walking, at least 150 mins/week

## 2023-08-19 NOTE — Assessment & Plan Note (Signed)
 Due to polycystic kidney disease and type II DM, s/p renal transplant in 2007 On Jardiance  for proteinuria Recently DCed Enalapril  Followed by nephrology

## 2023-08-19 NOTE — Assessment & Plan Note (Signed)
 Due to lumbar DDD Unable to take oral NSAIDs due to ESRD On oxycodone  5 mg 4 times daily as needed - will manage chronic pain medicine for now, if he has uncontrolled pain, will refer to pain management again

## 2023-08-24 DIAGNOSIS — N186 End stage renal disease: Secondary | ICD-10-CM | POA: Diagnosis not present

## 2023-08-24 DIAGNOSIS — Z940 Kidney transplant status: Secondary | ICD-10-CM | POA: Diagnosis not present

## 2023-08-24 DIAGNOSIS — D631 Anemia in chronic kidney disease: Secondary | ICD-10-CM | POA: Diagnosis not present

## 2023-08-24 DIAGNOSIS — I10 Essential (primary) hypertension: Secondary | ICD-10-CM | POA: Diagnosis not present

## 2023-08-26 ENCOUNTER — Ambulatory Visit

## 2023-08-26 ENCOUNTER — Telehealth: Payer: Self-pay | Admitting: Internal Medicine

## 2023-08-26 NOTE — Telephone Encounter (Signed)
 Called patient mail box was full needs a diabetic eye screening

## 2023-09-13 NOTE — Progress Notes (Signed)
 09/14/2023 8:03 AM   [name] Mar 27, 1965 [id number]  Referring provider: Tobie Suzzane POUR, MD [address] [place],  [place] [address]  No chief complaint on file.   HPI: 58 yo male here for E/M of (B) hydroceles. He is referred by Dr Ethan.    PMH: Past Medical History:  Diagnosis Date   DDD (degenerative disc disease), lumbar    Diabetes mellitus    Hypertension    Polycystic kidney disease     Surgical History: Past Surgical History:  Procedure Laterality Date   NEPHRECTOMY TRANSPLANTED ORGAN      Home Medications:  Allergies as of 09/14/2023   No Known Allergies      Medication List        Accurate as of September 13, 2023  8:03 AM. If you have any questions, ask your nurse or doctor.          acetaminophen  500 MG tablet Commonly known as: TYLENOL  Take 500 mg by mouth every 8 (eight) hours as needed for moderate pain (pain score 4-6).   amLODipine  10 MG tablet Commonly known as: NORVASC  Take 1 tablet (10 mg total) by mouth daily.   aspirin  EC 81 MG tablet Take 81 mg by mouth daily.   BD Pen Needle Nano U/F 32G X 4 MM Misc Generic drug: Insulin  Pen Needle 1 each by Does not apply route 4 (four) times daily.   carvedilol  6.25 MG tablet Commonly known as: COREG  Take 1 tablet (6.25 mg total) by mouth 2 (two) times daily.   furosemide  20 MG tablet Commonly known as: Lasix  Take 1 tablet (20 mg total) by mouth daily as needed for fluid or edema.   guaiFENesin -dextromethorphan  100-10 MG/5ML syrup Commonly known as: ROBITUSSIN DM Take 5 mLs by mouth every 4 (four) hours as needed for cough.   hydrALAZINE  25 MG tablet Commonly known as: APRESOLINE  Take 1 tablet (25 mg total) by mouth 3 (three) times daily.   isosorbide  mononitrate 30 MG 24 hr tablet Commonly known as: IMDUR  Take 0.5 tablets (15 mg total) by mouth daily.   Jardiance  10 MG Tabs tablet Generic drug: empagliflozin  Take 10 mg by mouth daily.   mycophenolate  250 MG  capsule Commonly known as: CELLCEPT  Take 1,000 mg by mouth 2 (two) times daily.   oxyCODONE  5 MG immediate release tablet Commonly known as: Oxy IR/ROXICODONE  Take 1 tablet (5 mg total) by mouth every 6 (six) hours as needed.   predniSONE  5 MG tablet Commonly known as: DELTASONE  Take 5 mg by mouth daily.   simvastatin  20 MG tablet Commonly known as: ZOCOR  Take 20 mg by mouth daily.   tacrolimus  1 MG capsule Commonly known as: PROGRAF  Take 2 mg by mouth 2 (two) times daily.   tetrahydrozoline-zinc 0.05-0.25 % ophthalmic solution Commonly known as: VISINE-AC Place 2 drops into both eyes 3 (three) times daily as needed. Dry/Red Eyes   Tresiba  FlexTouch 100 UNIT/ML FlexTouch Pen Generic drug: insulin  degludec Inject 20 Units into the skin at bedtime.   True Metrix Blood Glucose Test test strip Generic drug: glucose blood Use as instructed        Allergies: No Known Allergies  Family History: Family History  Problem Relation Age of Onset   Diabetes Mother    Kidney disease Mother    Stroke Mother    Kidney disease Father    Hypertension Father    Stroke Father    Heart disease Father     Social History:  reports  that he has never smoked. He has never used smokeless tobacco. He reports that he does not drink alcohol and does not use drugs.  ROS: All other review of systems were reviewed and are negative except what is noted above in HPI  Physical Exam: There were no vitals taken for this visit.  Constitutional:  Alert and oriented, No acute distress. HEENT: [name] AT, moist mucus membranes.  Trachea midline, no masses. Cardiovascular: No clubbing, cyanosis, or edema. Respiratory: Normal respiratory effort, no increased work of breathing. GI: No inguinal hernias GU: Normal phallus. No masses/lesions on penis, testis, scrotum. Prostate ***g smooth no nodules no induration.  Lymph: No cervical or inguinal lymphadenopathy. Skin: No rashes, bruises or suspicious  lesions. Neurologic: Grossly intact, no focal deficits, moving all 4 extremities. Psychiatric: Normal mood and affect.  Laboratory Data: Lab Results  Component Value Date   WBC 6.6 08/14/2023   HGB 11.0 (L) 08/14/2023   HCT 35.6 (L) 08/14/2023   MCV 86.2 08/14/2023   PLT 184 08/14/2023    Lab Results  Component Value Date   CREATININE 2.83 (H) 08/14/2023    No results found for: PSA  No results found for: TESTOSTERONE  Lab Results  Component Value Date   HGBA1C 8.7 06/29/2023    Urinalysis   Pertinent Imaging: ***  Assessment:    Plan:    There are no diagnoses linked to this encounter.  No follow-ups on file.  Ethan CHRISTELLA Shack, MD  [facility] [place]

## 2023-09-14 ENCOUNTER — Other Ambulatory Visit: Payer: Self-pay

## 2023-09-14 ENCOUNTER — Ambulatory Visit: Admitting: Urology

## 2023-09-14 VITALS — BP 128/64 | HR 80

## 2023-09-14 DIAGNOSIS — Z125 Encounter for screening for malignant neoplasm of prostate: Secondary | ICD-10-CM

## 2023-09-14 DIAGNOSIS — N433 Hydrocele, unspecified: Secondary | ICD-10-CM

## 2023-09-14 LAB — URINALYSIS, ROUTINE W REFLEX MICROSCOPIC
Bilirubin, UA: NEGATIVE
Ketones, UA: NEGATIVE
Leukocytes,UA: NEGATIVE
Nitrite, UA: NEGATIVE
Specific Gravity, UA: 1.02 (ref 1.005–1.030)
Urobilinogen, Ur: 0.2 mg/dL (ref 0.2–1.0)
pH, UA: 6 (ref 5.0–7.5)

## 2023-09-14 LAB — MICROSCOPIC EXAMINATION: WBC, UA: NONE SEEN /HPF (ref 0–5)

## 2023-09-15 LAB — PSA: Prostate Specific Ag, Serum: 0.6 ng/mL (ref 0.0–4.0)

## 2023-09-16 ENCOUNTER — Other Ambulatory Visit: Payer: Self-pay

## 2023-09-16 DIAGNOSIS — N433 Hydrocele, unspecified: Secondary | ICD-10-CM

## 2023-09-17 ENCOUNTER — Encounter: Payer: Self-pay | Admitting: Student

## 2023-09-17 ENCOUNTER — Other Ambulatory Visit (HOSPITAL_BASED_OUTPATIENT_CLINIC_OR_DEPARTMENT_OTHER): Payer: Self-pay

## 2023-09-17 ENCOUNTER — Ambulatory Visit: Attending: Student | Admitting: Student

## 2023-09-17 VITALS — BP 142/82 | HR 80 | Ht 64.0 in | Wt 160.8 lb

## 2023-09-17 DIAGNOSIS — I5021 Acute systolic (congestive) heart failure: Secondary | ICD-10-CM

## 2023-09-17 DIAGNOSIS — E782 Mixed hyperlipidemia: Secondary | ICD-10-CM | POA: Diagnosis not present

## 2023-09-17 DIAGNOSIS — I10 Essential (primary) hypertension: Secondary | ICD-10-CM | POA: Diagnosis not present

## 2023-09-17 DIAGNOSIS — Z940 Kidney transplant status: Secondary | ICD-10-CM

## 2023-09-17 MED ORDER — INSULIN DEGLUDEC 100 UNIT/ML ~~LOC~~ SOPN: 20.0000 [IU] | PEN_INJECTOR | Freq: Every day | SUBCUTANEOUS | 1 refills | Status: DC

## 2023-09-17 MED ORDER — SIMVASTATIN 20 MG PO TABS
20.0000 mg | ORAL_TABLET | Freq: Every day | ORAL | 2 refills | Status: DC
  Filled 2023-09-17: qty 90, 90d supply, fill #0
  Filled 2023-12-20: qty 90, 90d supply, fill #1
  Filled 2023-12-20: qty 90, 90d supply, fill #0

## 2023-09-17 MED ORDER — EMPAGLIFLOZIN 10 MG PO TABS
10.0000 mg | ORAL_TABLET | Freq: Every day | ORAL | 12 refills | Status: AC
  Filled 2023-11-08 – 2023-12-20 (×2): qty 30, 30d supply, fill #0
  Filled 2023-12-20 – 2024-02-02 (×2): qty 30, 30d supply, fill #1
  Filled 2024-03-16: qty 30, 30d supply, fill #0

## 2023-09-17 MED ORDER — JARDIANCE 10 MG PO TABS
10.0000 mg | ORAL_TABLET | Freq: Every day | ORAL | 3 refills | Status: DC
  Filled 2023-09-20: qty 30, 30d supply, fill #0

## 2023-09-17 MED ORDER — ISOSORBIDE MONONITRATE ER 30 MG PO TB24
15.0000 mg | ORAL_TABLET | Freq: Every day | ORAL | 3 refills | Status: DC
  Filled 2023-11-08: qty 45, 90d supply, fill #0

## 2023-09-17 MED ORDER — JARDIANCE 10 MG PO TABS
10.0000 mg | ORAL_TABLET | Freq: Every day | ORAL | 6 refills | Status: DC
  Filled 2023-09-17 (×2): qty 30, 30d supply, fill #0

## 2023-09-17 MED ORDER — HYDRALAZINE HCL 25 MG PO TABS
25.0000 mg | ORAL_TABLET | Freq: Three times a day (TID) | ORAL | 6 refills | Status: DC
  Filled 2023-09-17: qty 90, 30d supply, fill #0

## 2023-09-17 MED ORDER — ISOSORBIDE MONONITRATE ER 30 MG PO TB24
30.0000 mg | ORAL_TABLET | Freq: Every day | ORAL | 2 refills | Status: DC
  Filled 2023-09-17: qty 90, 90d supply, fill #0

## 2023-09-17 MED ORDER — ENALAPRIL MALEATE 20 MG PO TABS: 20.0000 mg | ORAL_TABLET | Freq: Every day | ORAL | 3 refills | Status: AC

## 2023-09-17 MED ORDER — HYDRALAZINE HCL 25 MG PO TABS
25.0000 mg | ORAL_TABLET | Freq: Three times a day (TID) | ORAL | 2 refills | Status: AC
  Filled 2023-11-08: qty 90, 30d supply, fill #0
  Filled 2023-12-20: qty 90, 30d supply, fill #1
  Filled 2023-12-20: qty 90, 30d supply, fill #0

## 2023-09-17 NOTE — Patient Instructions (Addendum)
 Medication Instructions:  Your physician has recommended you make the following change in your medication:  Increase isosorbide  to 30 mg daily Please restart simvastatin  at same dose Continue all other medications as prescribed  Labwork: none  Testing/Procedures: none  Follow-Up: Your physician recommends that you schedule a follow-up appointment in: 6-8 weeks with [facility] or [facility].  Any Other Special Instructions Will Be Listed Below (If Applicable).  If you need a refill on your cardiac medications before your next appointment, please call your pharmacy.

## 2023-09-17 NOTE — Progress Notes (Signed)
 Cardiology Office Note    Date:  09/19/2023  ID:  [name], [name] 31-May-1965, MRN [id number] Cardiologist: Alvan Carrier, MD Cardiology APP:  Johnson Laymon HERO, PA-C { :  History of Present Illness:    Oday Ridings is a 58 y.o. male with past medical history of HTN, HLD, Type II DM and history of renal transplant with Stage 3-4 CKD who presents to the office today for hospital follow-up.  He was most recently admitted to [facility] in 07/2023 for evaluation of shortness of breath and diagnosed with multifocal pneumonia. Echocardiogram during admission did show his EF was reduced at 35 to 40% with global hypokinesis and normal RV function. He was evaluated by Dr. Alvan and medical therapy was overall limited given his CKD and history of renal transplant. Was started on Hydralazine  25 mg 3 times daily and Imdur  15 mg daily. Was felt to be a poor cardiac catheterization candidate and continued medical management was recommended. Was ultimately discharged on Amlodipine  10 mg daily, ASA 81 mg daily, Coreg  6.25 mg twice daily, Hydralazine  25 mg 3 times daily, Imdur  15 mg daily and Jardiance  10 mg daily. Creatinine at the time of hospital discharge on 08/14/2023 was at 2.83.  In talking with the patient today, he reports things have been stable since recent hospitalization. Does have occasional shortness of breath but this resolves with stopping to take a deep breath. No recent chest pain or palpitations. No specific orthopnea, PND or pitting edema. He is active at baseline as he works 2-3 jobs. Says that he has been more compliant with his medications and has only missed a few doses since hospital discharge. He does not add salt to food but has fast food occasionally and we reviewed the importance of limiting his sodium intake.   Studies Reviewed:   EKG: EKG is not ordered today.   Echocardiogram: 07/2023 IMPRESSIONS     1. Global hypokinesis, worse in the anterior, inferior and septal  walls .  Left ventricular ejection fraction, by estimation, is 35 to 40%. The left  ventricle has moderately decreased function. The left ventricle  demonstrates global hypokinesis. The left   ventricular internal cavity size was mildly dilated. There is mild left  ventricular hypertrophy. Left ventricular diastolic parameters are  consistent with Grade I diastolic dysfunction (impaired relaxation).   2. Right ventricular systolic function is normal. The right ventricular  size is normal.   3. Left atrial size was moderately dilated.   4. Mild mitral valve regurgitation.   5. The aortic valve is tricuspid. Aortic valve regurgitation is not  visualized. Aortic valve sclerosis/calcification is present, without any  evidence of aortic stenosis.   6. The inferior vena cava is normal in size with greater than 50%  respiratory variability, suggesting right atrial pressure of 3 mmHg.    Physical Exam:   VS:  BP (!) 142/82   Pulse 80   Ht 5' 4 (1.626 m)   Wt 160 lb 12.8 oz (72.9 kg)   SpO2 98%   BMI 27.60 kg/m    Wt Readings from Last 3 Encounters:  09/17/23 160 lb 12.8 oz (72.9 kg)  08/19/23 160 lb 3.2 oz (72.7 kg)  08/13/23 159 lb 9.8 oz (72.4 kg)     GEN: Well nourished, well developed male appearing in no acute distress NECK: No JVD; No carotid bruits CARDIAC: RRR, no murmurs, rubs, gallops RESPIRATORY:  Clear to auscultation without rales, wheezing or rhonchi  ABDOMEN: Appears non-distended. No obvious  abdominal masses. EXTREMITIES: No clubbing or cyanosis. No pitting edema.  Distal pedal pulses are 2+ bilaterally.   Assessment and Plan:   1. Acute HFrEF (heart failure with reduced ejection fraction) (HCC) - Recent echocardiogram showed his EF was reduced at 35 to 40% and medical management was recommended given his advanced CKD as he would be a poor candidate for cardiac catheterization. Was not started on an ARNI or MRA given his CKD. GFR was stable at 25 and he was  started on Jardiance  10 mg daily. He does report having labs with Nephrology since hospital discharge and we will request a copy of these. - Will titrate Imdur  from 15 mg daily to 30 mg daily. Continue Coreg  6.25 mg twice daily, Hydralazine  25 mg 3 times daily, Enalapril  20mg  daily (deferred to Nephrology) and Jardiance  10 mg daily. Will arrange for follow-up in 6 to 8 weeks for further medication titration. Would plan for a follow-up echocardiogram later this year for reassessment of his EF and if no improvement, would consider referral to A[facility] since optimizing GDMT will be challenging given his CKD.   2. Essential hypertension - BP was at 144/84 during today's visit with similar values by recheck  He is currently on Amlodipine  10 mg daily, Coreg  6.25 mg twice daily, Enalapril  20 mg daily, Hydralazine  25 mg 3 times daily and Imdur  15 mg daily.  Will titrate Imdur  from 15 mg daily to 30 mg daily to assist with BP and his cardiomyopathy. If BP remains above goal, can further titrate Hydralazine . Will defer continuation of Enalapril  to Nephrology in the setting of his advanced CKD.  3. Mixed hyperlipidemia - Followed by his PCP. Remains on Simvastatin  20 mg daily. Would not further titrate given the concurrent use of Amlodipine . If LDL is above goal, would recommend switching to Atorvastatin.  4. History of renal transplant with Stage 3-4 CKD - He underwent renal transplant in 2007 due to ESRD secondary to polycystic kidney disease. He is followed by Dr. Marcelino with [facility]. Most recent visit was on 08/24/2023 and by review of Care Everywhere and no changes were made to his medications at that time.  Signed, Laymon CHRISTELLA Qua, PA-C

## 2023-09-19 ENCOUNTER — Encounter: Payer: Self-pay | Admitting: Student

## 2023-09-20 ENCOUNTER — Ambulatory Visit: Payer: Self-pay | Admitting: Urology

## 2023-09-20 ENCOUNTER — Other Ambulatory Visit (HOSPITAL_BASED_OUTPATIENT_CLINIC_OR_DEPARTMENT_OTHER): Payer: Self-pay

## 2023-09-21 ENCOUNTER — Encounter: Payer: Self-pay | Admitting: Internal Medicine

## 2023-09-21 ENCOUNTER — Ambulatory Visit: Admitting: Internal Medicine

## 2023-09-21 ENCOUNTER — Other Ambulatory Visit (HOSPITAL_BASED_OUTPATIENT_CLINIC_OR_DEPARTMENT_OTHER): Payer: Self-pay

## 2023-09-21 ENCOUNTER — Ambulatory Visit (HOSPITAL_COMMUNITY)
Admission: RE | Admit: 2023-09-21 | Discharge: 2023-09-21 | Disposition: A | Discharge status: Home / Self Care | Source: Ambulatory Visit | Attending: Internal Medicine | Admitting: Internal Medicine

## 2023-09-21 VITALS — BP 146/84 | HR 80 | Resp 16 | Ht 64.0 in | Wt 159.0 lb

## 2023-09-21 DIAGNOSIS — J189 Pneumonia, unspecified organism: Secondary | ICD-10-CM

## 2023-09-21 DIAGNOSIS — I10 Essential (primary) hypertension: Secondary | ICD-10-CM | POA: Diagnosis not present

## 2023-09-21 DIAGNOSIS — E1169 Type 2 diabetes mellitus with other specified complication: Secondary | ICD-10-CM | POA: Diagnosis not present

## 2023-09-21 DIAGNOSIS — Z23 Encounter for immunization: Secondary | ICD-10-CM | POA: Diagnosis not present

## 2023-09-21 DIAGNOSIS — Z794 Long term (current) use of insulin: Secondary | ICD-10-CM

## 2023-09-21 DIAGNOSIS — I5020 Unspecified systolic (congestive) heart failure: Secondary | ICD-10-CM | POA: Diagnosis not present

## 2023-09-21 DIAGNOSIS — D849 Immunodeficiency, unspecified: Secondary | ICD-10-CM

## 2023-09-21 MED ORDER — LANCETS MISC. MISC: 1.0000 | Freq: Three times a day (TID) | 3 refills | Status: AC

## 2023-09-21 MED ORDER — BLOOD GLUCOSE TEST VI STRP: 1.0000 | ORAL_STRIP | Freq: Three times a day (TID) | 3 refills | Status: DC

## 2023-09-21 MED ORDER — BLOOD GLUCOSE MONITORING SUPPL DEVI: 1.0000 | Freq: Three times a day (TID) | 0 refills | Status: AC

## 2023-09-21 NOTE — Progress Notes (Signed)
 Established Patient Office Visit  Subjective:  Patient ID: [name], male    DOB: Apr 02, 1965  Age: 58 y.o. MRN: [id number]  CC:  Chief Complaint  Patient presents with   Hypertension    Follow up      HPI [name] is a 58 y.o. male with past medical history of ESRD s/p renal transplant (2007), HTN, type II DM, HLD and lumbar DDD who presents for f/u of his chronic medical conditions.  HTN: His BP was elevated today, as he has not taken his medicines.  He is supposed to take amlodipine  10 mg QD, Coreg  6.25 mg BID, Hydralazine  25 mg TID and Imdur  30 mg once daily. His enalapril  20 mg once daily has been discontinued from recent hospitalization due to AKI, followed by nephrology.  Denies any headache, dizziness, chest pain, dyspnea or palpitations.  Type II DM: His HbA1c was 8.7 in 05/25.  He takes Tresiba  20 units nightly, followed by endocrinology.  He is on Jardiance  10 mg QD for proteinuria.  Denies polyuria or polyphagia currently.  ESRD s/p renal transplant: He has history of polycystic kidney disease, s/p nephrectomy.  He had renal transplant in 2007, currently followed by nephrology - [name] and transplant team at [facility].  He is on tacrolimus , mycophenolate  and oral prednisone .   Chronic pain syndrome: He has history of lumbar DDD.  He has completed PT, denies any history of spine surgery.  He has been on oxycodone  5 mg QID PRN for about 10 years.  He was getting pain medicines from his previous PCP, but later started seeing pain clinic at [facility] for it.  He requests pain management from us .  I had lengthy discussion with him about pain management and have agreed to prescribe it for now.  Denies alcohol abuse or any other illicit drug use currently.   Scrotal mass: He reports having enlarging mass of the scrotum on the right side - hydrocele, for the last 9 months.  Denies any scrotal pain currently.  He has erectile dysfunction, but denies dysuria, hematuria  or urinary hesitancy or resistance. He was evaluated by Urology - [name], planned to see [name] for procedure.    Past Medical History:  Diagnosis Date   DDD (degenerative disc disease), lumbar    Diabetes mellitus    Hypertension    Polycystic kidney disease     Past Surgical History:  Procedure Laterality Date   NEPHRECTOMY TRANSPLANTED ORGAN      Family History  Problem Relation Age of Onset   Diabetes Mother    Kidney disease Mother    Stroke Mother    Kidney disease Father    Hypertension Father    Stroke Father    Heart disease Father     Social History   Socioeconomic History   Marital status: Single    Spouse name: Not on file   Number of children: Not on file   Years of education: Not on file   Highest education level: Not on file  Occupational History   Not on file  Tobacco Use   Smoking status: Never   Smokeless tobacco: Never  Substance and Sexual Activity   Alcohol use: No    Comment: rarely   Drug use: No   Sexual activity: Not Currently  Other Topics Concern   Not on file  Social History Narrative   Not on file   Social Drivers of Health   Financial Resource Strain: Not on file  Food Insecurity: No Food Insecurity (08/16/2023)   Hunger Vital Sign    Worried About Running Out of Food in the Last Year: Never true    Ran Out of Food in the Last Year: Never true  Transportation Needs: No Transportation Needs (08/16/2023)   PRAPARE - Administrator, Civil Service (Medical): No    Lack of Transportation (Non-Medical): No  Physical Activity: Not on file  Stress: Not on file  Social Connections: Not on file  Intimate Partner Violence: Not At Risk (08/16/2023)   Humiliation, Afraid, Rape, and Kick questionnaire    Fear of Current or Ex-Partner: No    Emotionally Abused: No    Physically Abused: No    Sexually Abused: No    Outpatient Medications Prior to Visit  Medication Sig Dispense Refill   amLODipine  (NORVASC )  10 MG tablet Take 1 tablet (10 mg total) by mouth daily. 90 tablet 3   aspirin  EC 81 MG tablet Take 81 mg by mouth daily.     carvedilol  (COREG ) 6.25 MG tablet Take 1 tablet (6.25 mg total) by mouth 2 (two) times daily. 180 tablet 3   furosemide  (LASIX ) 20 MG tablet Take 1 tablet (20 mg total) by mouth daily as needed for fluid or edema. 30 tablet 1   hydrALAZINE  (APRESOLINE ) 25 MG tablet Take 1 tablet (25 mg total) by mouth 3 (three) times daily. 90 tablet 2   insulin  degludec (TRESIBA ) 100 UNIT/ML FlexTouch Pen Inject 20 Units into the skin at bedtime. 15 mL 1   isosorbide  mononitrate (IMDUR ) 30 MG 24 hr tablet Take 0.5 tablets (15 mg total) by mouth daily. 60 tablet 3   JARDIANCE  10 MG TABS tablet Take 1 tablet (10 mg total) by mouth daily. 90 tablet 3   predniSONE  (DELTASONE ) 5 MG tablet Take 5 mg by mouth daily.     tacrolimus  (PROGRAF ) 1 MG capsule Take 2 mg by mouth 2 (two) times daily.     acetaminophen  (TYLENOL ) 500 MG tablet Take 500 mg by mouth every 8 (eight) hours as needed for moderate pain (pain score 4-6).     empagliflozin  (JARDIANCE ) 10 MG TABS tablet Take 1 tablet (10 mg total) by mouth daily in the morning. 30 tablet 12   enalapril  (VASOTEC ) 20 MG tablet Take 1 tablet (20 mg total) by mouth daily. 90 tablet 3   guaiFENesin -dextromethorphan  (ROBITUSSIN DM) 100-10 MG/5ML syrup Take 5 mLs by mouth every 4 (four) hours as needed for cough. 118 mL 0   Insulin  Pen Needle (BD PEN NEEDLE NANO U/F) 32G X 4 MM MISC 1 each by Does not apply route 4 (four) times daily. 100 each 1   mycophenolate  (CELLCEPT ) 250 MG capsule Take 1,000 mg by mouth 2 (two) times daily.     oxyCODONE  (OXY IR/ROXICODONE ) 5 MG immediate release tablet Take 1 tablet (5 mg total) by mouth every 6 (six) hours as needed. 120 tablet 0   simvastatin  (ZOCOR ) 20 MG tablet Take 1 tablet (20 mg total) by mouth daily. 90 tablet 2   tetrahydrozoline-zinc (VISINE-AC) 0.05-0.25 % ophthalmic solution Place 2 drops into both eyes  3 (three) times daily as needed. Dry/Red Eyes     glucose blood (TRUE METRIX BLOOD GLUCOSE TEST) test strip Use as instructed 100 each 2   hydrALAZINE  (APRESOLINE ) 25 MG tablet Take 1 tablet (25 mg total) by mouth 3 (three) times daily. 90 tablet 6   insulin  degludec (TRESIBA  FLEXTOUCH) 100 UNIT/ML FlexTouch Pen Inject 20 Units  into the skin at bedtime. 15 mL 1   isosorbide  mononitrate (IMDUR ) 30 MG 24 hr tablet Take 1 tablet (30 mg total) by mouth daily. 90 tablet 2   No facility-administered medications prior to visit.    No Known Allergies  ROS Review of Systems  Constitutional:  Negative for chills and fever.  HENT:  Negative for congestion and sore throat.   Eyes:  Negative for pain and discharge.  Respiratory:  Negative for cough and shortness of breath.   Cardiovascular:  Negative for chest pain and palpitations.  Gastrointestinal:  Negative for diarrhea, nausea and vomiting.  Endocrine: Negative for polydipsia and polyuria.  Genitourinary:  Positive for scrotal swelling. Negative for dysuria and hematuria.  Musculoskeletal:  Positive for back pain. Negative for neck pain and neck stiffness.  Skin:  Negative for rash.  Neurological:  Negative for dizziness and weakness.  Psychiatric/Behavioral:  Negative for agitation and behavioral problems.       Objective:    Physical Exam Vitals reviewed.  Constitutional:      General: He is not in acute distress.    Appearance: He is not diaphoretic.  HENT:     Head: Normocephalic and atraumatic.     Nose: Nose normal.     Mouth/Throat:     Mouth: Mucous membranes are moist.  Eyes:     General: No scleral icterus.    Extraocular Movements: Extraocular movements intact.  Cardiovascular:     Rate and Rhythm: Normal rate and regular rhythm.     Heart sounds: Normal heart sounds. No murmur heard. Pulmonary:     Breath sounds: No wheezing or rales.  Genitourinary:    Testes:        Right: Testicular hydrocele present.         Left: Testicular hydrocele present.  Musculoskeletal:     Cervical back: Neck supple. No tenderness.     Right lower leg: No edema.     Left lower leg: No edema.  Skin:    General: Skin is warm.     Findings: No rash.  Neurological:     General: No focal deficit present.     Mental Status: He is alert and oriented to person, place, and time.  Psychiatric:        Mood and Affect: Mood normal.        Behavior: Behavior normal.     BP (!) 146/84 (BP Location: Right Arm)   Pulse 80   Resp 16   Ht 5' 4 (1.626 m)   Wt 159 lb (72.1 kg)   SpO2 98%   BMI 27.29 kg/m  Wt Readings from Last 3 Encounters:  09/21/23 159 lb (72.1 kg)  09/17/23 160 lb 12.8 oz (72.9 kg)  08/19/23 160 lb 3.2 oz (72.7 kg)    Lab Results  Component Value Date   TSH 1.132 08/14/2023   Lab Results  Component Value Date   WBC 6.6 08/14/2023   HGB 11.0 (L) 08/14/2023   HCT 35.6 (L) 08/14/2023   MCV 86.2 08/14/2023   PLT 184 08/14/2023   Lab Results  Component Value Date   NA 139 08/14/2023   K 4.2 08/14/2023   CO2 21 (L) 08/14/2023   GLUCOSE 176 (H) 08/14/2023   BUN 52 (H) 08/14/2023   CREATININE 2.83 (H) 08/14/2023   BILITOT 0.3 08/12/2023   ALKPHOS 77 08/12/2023   AST 19 08/12/2023   ALT 12 08/12/2023   PROT 6.8 08/12/2023   ALBUMIN 3.1 (L)  08/12/2023   CALCIUM 8.6 (L) 08/14/2023   ANIONGAP 9 08/14/2023   EGFR 24.0 06/29/2023   No results found for: CHOL No results found for: HDL No results found for: LDLCALC No results found for: TRIG No results found for: CHOLHDL Lab Results  Component Value Date   HGBA1C 8.7 06/29/2023      Assessment & Plan:   Problem List Items Addressed This Visit       Cardiovascular and Mediastinum   Essential hypertension - Primary   BP Readings from Last 1 Encounters:  09/21/23 (!) 154/81   Elevated today as he has not had his medications Usually well-controlled with amlodipine  10 mg QD, Coreg  6.25 mg BID Has been placed on  hydralazine  25 mg 3 times daily and Imdur  30 mg QD DCed enalapril  20 mg QD recently from hospitalization, and advised to contact nephrology before restarting enalapril  Counseled for compliance with the medications Advised DASH diet and moderate exercise/walking, at least 150 mins/week      HFrEF (heart failure with reduced ejection fraction) (HCC)   Recent echocardiogram reviewed - LVEF 35-40% Has been placed on hydralazine  and Imdur  Needs to continue amlodipine  and Coreg  Enalapril  recently discontinued, was given Lasix  20 mg QD as needed Appears euvolemic today Follow up with cardiology        Respiratory   Multifocal pneumonia   Was treated with IV antibiotics during recent hospitalization in 06/25 Check x-ray of chest for resolution of multifocal pneumonia      Relevant Orders   DG Chest 2 View     Endocrine   Type 2 diabetes mellitus with other specified complication (HCC)   Lab Results  Component Value Date   HGBA1C 8.7 06/29/2023   Uncontrolled Associated with HTN, HLD, CKD On Tresiba  20 units nightly and Jardiance  10 mg QD, followed by endocrinology Advised to follow diabetic diet On statin and ACEi, needs to be compliant to statin F/u CMP and lipid panel Diabetic eye exam: Advised to follow up with Ophthalmology for diabetic eye exam      Relevant Medications   Blood Glucose Monitoring Suppl DEVI   Glucose Blood (BLOOD GLUCOSE TEST STRIPS) STRP   Lancets Misc. MISC   Other Relevant Orders   Ambulatory referral to Optometry     Other   Immunosuppressed status (HCC)   On tacrolimus , CellCept  and oral prednisone  Followed by nephrology-[name] and transplant team at [facility]      Other Visit Diagnoses       Encounter for immunization       Relevant Orders   Pneumococcal conjugate vaccine 20-valent (Completed)       Meds ordered this encounter  Medications   Blood Glucose Monitoring Suppl DEVI    Sig: 1 each by Does not apply route in the  morning, at noon, and at bedtime. May substitute to any manufacturer covered by patient's insurance.    Dispense:  1 each    Refill:  0   Glucose Blood (BLOOD GLUCOSE TEST STRIPS) STRP    Sig: 1 each by In Vitro route in the morning, at noon, and at bedtime. May substitute to any manufacturer covered by patient's insurance.    Dispense:  100 strip    Refill:  3   Lancets Misc. MISC    Sig: 1 each by Does not apply route in the morning, at noon, and at bedtime. May substitute to any manufacturer covered by patient's insurance.    Dispense:  100 each  Refill:  3    Follow-up: Return in about 4 months (around 01/21/2024) for HTN.    Suzzane MARLA Blanch, MD

## 2023-09-21 NOTE — Assessment & Plan Note (Signed)
 Was treated with IV antibiotics during recent hospitalization in 06/25 Check x-ray of chest for resolution of multifocal pneumonia

## 2023-09-21 NOTE — Assessment & Plan Note (Signed)
 Recent echocardiogram reviewed - LVEF 35-40% Has been placed on hydralazine and Imdur Needs to continue amlodipine  and Coreg  Enalapril  recently discontinued, was given Lasix 20 mg QD as needed Appears euvolemic today Follow up with cardiology

## 2023-09-21 NOTE — Patient Instructions (Addendum)
 Please continue to take medications as prescribed.  Please continue to follow low carb diet and perform moderate exercise/walking at least 150 mins/week.  Please get x-ray of chest done at [facility].  You are being referred to [facility]. [address] [place], [place], [place] [address] Phone: [phone]

## 2023-09-21 NOTE — Assessment & Plan Note (Signed)
 On tacrolimus , CellCept  and oral prednisone  Followed by nephrology-Dr. Rhesa Celeste and transplant team at [facility]

## 2023-09-21 NOTE — Assessment & Plan Note (Signed)
 Lab Results  Component Value Date   HGBA1C 8.7 06/29/2023   Uncontrolled Associated with HTN, HLD, CKD On Tresiba  20 units nightly and Jardiance 10 mg QD, followed by endocrinology Advised to follow diabetic diet On statin and ACEi, needs to be compliant to statin F/u CMP and lipid panel Diabetic eye exam: Advised to follow up with Ophthalmology for diabetic eye exam

## 2023-09-21 NOTE — Assessment & Plan Note (Addendum)
 BP Readings from Last 1 Encounters:  09/21/23 (!) 154/81   Elevated today as he has not had his medications Usually well-controlled with amlodipine  10 mg QD, Coreg  6.25 mg BID Has been placed on hydralazine  25 mg 3 times daily and Imdur  30 mg QD DCed enalapril  20 mg QD recently from hospitalization, and advised to contact nephrology before restarting enalapril  Counseled for compliance with the medications Advised DASH diet and moderate exercise/walking, at least 150 mins/week

## 2023-09-27 ENCOUNTER — Other Ambulatory Visit (HOSPITAL_BASED_OUTPATIENT_CLINIC_OR_DEPARTMENT_OTHER): Payer: Self-pay

## 2023-09-28 ENCOUNTER — Other Ambulatory Visit (HOSPITAL_BASED_OUTPATIENT_CLINIC_OR_DEPARTMENT_OTHER): Payer: Self-pay

## 2023-09-29 ENCOUNTER — Ambulatory Visit: Payer: Self-pay | Admitting: Internal Medicine

## 2023-09-30 ENCOUNTER — Other Ambulatory Visit (HOSPITAL_BASED_OUTPATIENT_CLINIC_OR_DEPARTMENT_OTHER): Payer: Self-pay

## 2023-09-30 ENCOUNTER — Other Ambulatory Visit: Payer: Self-pay | Admitting: Internal Medicine

## 2023-09-30 ENCOUNTER — Other Ambulatory Visit (HOSPITAL_COMMUNITY): Payer: Self-pay

## 2023-09-30 DIAGNOSIS — G894 Chronic pain syndrome: Secondary | ICD-10-CM

## 2023-09-30 DIAGNOSIS — M51362 Other intervertebral disc degeneration, lumbar region with discogenic back pain and lower extremity pain: Secondary | ICD-10-CM

## 2023-09-30 MED ORDER — OXYCODONE HCL 5 MG PO TABS: 5.0000 mg | ORAL_TABLET | Freq: Four times a day (QID) | ORAL | 0 refills | Status: DC | PRN

## 2023-09-30 NOTE — Telephone Encounter (Signed)
 Patient aware of results.

## 2023-09-30 NOTE — Telephone Encounter (Signed)
-----   Message from Suzzane MARLA Blanch sent at 09/29/2023 11:32 AM EDT ----- Please advise him that his x-ray of chest showed his pneumonia has resolved.  No other acute findings noted. ----- Message ----- From: Interface, Rad Results In Sent: 09/29/2023   8:20 AM EDT To: Suzzane MARLA Blanch, MD

## 2023-09-30 NOTE — Telephone Encounter (Signed)
 Copied from CRM #[id number]. Topic: Clinical - Medication Refill >> Sep 30, 2023 10:52 AM Charlet HERO wrote: Medication: oxyCODONE  (OXY IR/ROXICODONE ) 5 MG immediate release tablet  Has the patient contacted their pharmacy? Yes Informed would need to call provider  This is the patient's preferred pharmacy:  [facility] - [place], [place] - [address] [place] [address] Phone: [phone] Fax: [phone]   Is this the correct pharmacy for this prescription? Yes If no, delete pharmacy and type the correct one.   Has the prescription been filled recently? Yes  Is the patient out of the medication? Yes  Has the patient been seen for an appointment in the last year OR does the patient have an upcoming appointment? Yes  Can we respond through MyChart? Yes  Agent: Please be advised that Rx refills may take up to 3 business days. We ask that you follow-up with your pharmacy.

## 2023-10-01 ENCOUNTER — Other Ambulatory Visit (HOSPITAL_BASED_OUTPATIENT_CLINIC_OR_DEPARTMENT_OTHER): Payer: Self-pay

## 2023-10-04 ENCOUNTER — Other Ambulatory Visit (HOSPITAL_BASED_OUTPATIENT_CLINIC_OR_DEPARTMENT_OTHER): Payer: Self-pay

## 2023-10-05 ENCOUNTER — Telehealth: Payer: Self-pay

## 2023-10-05 ENCOUNTER — Encounter: Payer: Self-pay | Admitting: Nutrition

## 2023-10-05 ENCOUNTER — Other Ambulatory Visit (HOSPITAL_BASED_OUTPATIENT_CLINIC_OR_DEPARTMENT_OTHER): Payer: Self-pay

## 2023-10-05 ENCOUNTER — Other Ambulatory Visit (HOSPITAL_COMMUNITY): Payer: Self-pay

## 2023-10-05 ENCOUNTER — Ambulatory Visit: Admitting: "Endocrinology

## 2023-10-05 ENCOUNTER — Encounter: Payer: Self-pay | Admitting: "Endocrinology

## 2023-10-05 ENCOUNTER — Encounter: Attending: "Endocrinology | Admitting: Nutrition

## 2023-10-05 VITALS — BP 138/78 | HR 76 | Ht 64.0 in | Wt 161.0 lb

## 2023-10-05 VITALS — Ht 64.0 in | Wt 161.0 lb

## 2023-10-05 DIAGNOSIS — E782 Mixed hyperlipidemia: Secondary | ICD-10-CM

## 2023-10-05 DIAGNOSIS — Z794 Long term (current) use of insulin: Secondary | ICD-10-CM | POA: Insufficient documentation

## 2023-10-05 DIAGNOSIS — I10 Essential (primary) hypertension: Secondary | ICD-10-CM | POA: Insufficient documentation

## 2023-10-05 DIAGNOSIS — E1122 Type 2 diabetes mellitus with diabetic chronic kidney disease: Secondary | ICD-10-CM | POA: Diagnosis not present

## 2023-10-05 DIAGNOSIS — Z940 Kidney transplant status: Secondary | ICD-10-CM | POA: Diagnosis not present

## 2023-10-05 DIAGNOSIS — Z7984 Long term (current) use of oral hypoglycemic drugs: Secondary | ICD-10-CM

## 2023-10-05 DIAGNOSIS — N184 Chronic kidney disease, stage 4 (severe): Secondary | ICD-10-CM | POA: Diagnosis not present

## 2023-10-05 DIAGNOSIS — N1832 Chronic kidney disease, stage 3b: Secondary | ICD-10-CM

## 2023-10-05 LAB — POCT GLYCOSYLATED HEMOGLOBIN (HGB A1C): HbA1c, POC (controlled diabetic range): 7.6 % — AB (ref 0.0–7.0)

## 2023-10-05 MED ORDER — FREESTYLE LIBRE 3 READER DEVI
0 refills | Status: DC
  Filled 2023-10-05: qty 1, 90d supply, fill #0
  Filled 2024-02-14: qty 1, 30d supply, fill #0

## 2023-10-05 MED ORDER — FREESTYLE LIBRE 3 SENSOR MISC
2 refills | Status: DC
  Filled 2023-10-05 – 2024-02-14 (×2): qty 2, 28d supply, fill #0

## 2023-10-05 MED ORDER — INSULIN DEGLUDEC 100 UNIT/ML ~~LOC~~ SOPN: 30.0000 [IU] | PEN_INJECTOR | Freq: Every day | SUBCUTANEOUS | 1 refills | Status: AC

## 2023-10-05 NOTE — Progress Notes (Signed)
 10/05/2023, 12:00 PM   Endocrinology follow-up note  Subjective:    Patient ID: [name], male    DOB: 1965-02-26.  [name] is being seen in follow-up of he was seen in the consultation for management of currently uncontrolled symptomatic diabetes requested by  Tobie Suzzane POUR, MD.   Past Medical History:  Diagnosis Date   DDD (degenerative disc disease), lumbar    Diabetes mellitus    Hypertension    Polycystic kidney disease     Past Surgical History:  Procedure Laterality Date   NEPHRECTOMY TRANSPLANTED ORGAN      Social History   Socioeconomic History   Marital status: Single    Spouse name: Not on file   Number of children: Not on file   Years of education: Not on file   Highest education level: Not on file  Occupational History   Not on file  Tobacco Use   Smoking status: Never   Smokeless tobacco: Never  Substance and Sexual Activity   Alcohol use: No    Comment: rarely   Drug use: No   Sexual activity: Not Currently  Other Topics Concern   Not on file  Social History Narrative   Not on file   Social Drivers of Health   Financial Resource Strain: Not on file  Food Insecurity: No Food Insecurity (08/16/2023)   Hunger Vital Sign    Worried About Running Out of Food in the Last Year: Never true    Ran Out of Food in the Last Year: Never true  Transportation Needs: No Transportation Needs (08/16/2023)   PRAPARE - Administrator, Civil Service (Medical): No    Lack of Transportation (Non-Medical): No  Physical Activity: Not on file  Stress: Not on file  Social Connections: Not on file    Family History  Problem Relation Age of Onset   Diabetes Mother    Kidney disease Mother    Stroke Mother    Kidney disease Father    Hypertension Father    Stroke Father    Heart disease Father     Outpatient Encounter Medications as of 10/05/2023   Medication Sig   Continuous Glucose Receiver (FREESTYLE LIBRE 3 READER) DEVI Use as Directed   Continuous Glucose Sensor (FREESTYLE LIBRE 3 PLUS SENSOR) MISC Change sensor every 15 days   acetaminophen  (TYLENOL ) 500 MG tablet Take 500 mg by mouth every 8 (eight) hours as needed for moderate pain (pain score 4-6).   amLODipine  (NORVASC ) 10 MG tablet Take 1 tablet (10 mg total) by mouth daily.   aspirin  EC 81 MG tablet Take 81 mg by mouth daily.   Blood Glucose Monitoring Suppl DEVI 1 each by Does not apply route in the morning, at noon, and at bedtime. May substitute to any manufacturer covered by patient's insurance.   carvedilol  (COREG ) 6.25 MG tablet Take 1 tablet (6.25 mg total) by mouth 2 (two) times daily.   empagliflozin  (JARDIANCE ) 10 MG TABS tablet Take 1 tablet (10 mg total) by mouth daily in the morning.   enalapril  (VASOTEC ) 20 MG tablet Take 1 tablet (20  mg total) by mouth daily.   furosemide  (LASIX ) 20 MG tablet Take 1 tablet (20 mg total) by mouth daily as needed for fluid or edema.   Glucose Blood (BLOOD GLUCOSE TEST STRIPS) STRP 1 each by In Vitro route in the morning, at noon, and at bedtime. May substitute to any manufacturer covered by patient's insurance.   guaiFENesin -dextromethorphan  (ROBITUSSIN DM) 100-10 MG/5ML syrup Take 5 mLs by mouth every 4 (four) hours as needed for cough. (Patient not taking: Reported on 10/05/2023)   hydrALAZINE  (APRESOLINE ) 25 MG tablet Take 1 tablet (25 mg total) by mouth 3 (three) times daily.   insulin  degludec (TRESIBA ) 100 UNIT/ML FlexTouch Pen Inject 30 Units into the skin at bedtime.   Insulin  Pen Needle (BD PEN NEEDLE NANO U/F) 32G X 4 MM MISC 1 each by Does not apply route 4 (four) times daily.   isosorbide  mononitrate (IMDUR ) 30 MG 24 hr tablet Take 0.5 tablets (15 mg total) by mouth daily.   JARDIANCE  10 MG TABS tablet Take 1 tablet (10 mg total) by mouth daily.   Lancets Misc. MISC 1 each by Does not apply route in the morning, at noon,  and at bedtime. May substitute to any manufacturer covered by patient's insurance.   mycophenolate  (CELLCEPT ) 250 MG capsule Take 1,000 mg by mouth 2 (two) times daily.   oxyCODONE  (OXY IR/ROXICODONE ) 5 MG immediate release tablet Take 1 tablet (5 mg total) by mouth every 6 (six) hours as needed.   predniSONE  (DELTASONE ) 5 MG tablet Take 5 mg by mouth daily.   simvastatin  (ZOCOR ) 20 MG tablet Take 1 tablet (20 mg total) by mouth daily. (Patient not taking: Reported on 10/05/2023)   tacrolimus  (PROGRAF ) 1 MG capsule Take 2 mg by mouth 2 (two) times daily.   tetrahydrozoline-zinc (VISINE-AC) 0.05-0.25 % ophthalmic solution Place 2 drops into both eyes 3 (three) times daily as needed. Dry/Red Eyes   [DISCONTINUED] insulin  degludec (TRESIBA ) 100 UNIT/ML FlexTouch Pen Inject 20 Units into the skin at bedtime.   No facility-administered encounter medications on file as of 10/05/2023.    ALLERGIES: No Known Allergies  VACCINATION STATUS: Immunization History  Administered Date(s) Administered   Influenza, High Dose Seasonal PF 12/21/2012   PNEUMOCOCCAL CONJUGATE-20 09/21/2023   PPD Test 04/01/2022    Diabetes He presents for his follow-up diabetic visit. Diabetes type: Renal transplant related diabetes. His disease course has been improving. Hypoglycemia symptoms include headaches. Pertinent negatives for hypoglycemia include no confusion, pallor or seizures. Associated symptoms include polydipsia and polyuria. Pertinent negatives for diabetes include no chest pain, no fatigue, no polyphagia and no weakness. Symptoms are improving. Diabetic complications include nephropathy. 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  He is currently on Jardiance  10 mg p.o. daily, and Tresiba  20 units nightly.   He is transplanted kidney is also failing with GFR of 33.  He was taken off of metformin during his last visit. ) Risk factors for coronary  artery disease include dyslipidemia, family history, male sex and sedentary lifestyle. His weight is fluctuating minimally. He is following a generally unhealthy diet. When asked about meal planning, he reported none. He rarely participates in exercise. His home blood glucose trend is decreasing steadily. His breakfast blood glucose range is generally 140-180 mg/dl. His overall blood glucose range is 140-180 mg/dl. (Patient brought his meter showing average blood glucose of 185 for the last 14 days, however he monitors only once a day.  His point-of-care A1c is 7.6% improving from 8.7%.  He did not document any hypoglycemia.    ) An ACE inhibitor/angiotensin II receptor blocker is being taken.  Hypertension This is a chronic problem. The current episode started more than 1 year ago. The problem is uncontrolled. Associated symptoms include headaches. Pertinent negatives include no chest pain, neck pain, palpitations or shortness of breath. Risk factors for coronary artery disease include diabetes mellitus, male gender and sedentary lifestyle. Past treatments include ACE inhibitors. Hypertensive end-organ damage includes kidney disease. Identifiable causes of hypertension include chronic renal disease.      Objective:       10/05/2023    9:21 AM 10/05/2023    8:55 AM 09/21/2023    9:01 AM  Vitals with BMI  Height 5' 4 5' 4   Weight 161 lbs 161 lbs   BMI 27.62 27.62   Systolic  138 146  Diastolic  78 84  Pulse  76     BP 138/78   Pulse 76   Ht 5' 4 (1.626 m)   Wt 161 lb (73 kg)   BMI 27.64 kg/m   Wt Readings from Last 3 Encounters:  10/05/23 161 lb (73 kg)  10/05/23 161 lb (73 kg)  09/21/23 159 lb (72.1 kg)      CMP ( most recent) CMP     Component Value Date/Time   NA 139 08/14/2023 0338   K 4.2 08/14/2023 0338   CL 109 08/14/2023 0338   CO2 21 (L) 08/14/2023 0338   GLUCOSE 176 (H) 08/14/2023 0338   BUN 52 (H) 08/14/2023 0338   BUN 47 (A) 06/29/2023 0000   CREATININE  2.83 (H) 08/14/2023 0338   CALCIUM 8.6 (L) 08/14/2023 0338   CALCIUM 9.3 06/29/2023 1016   PROT 6.8 08/12/2023 0359   ALBUMIN 3.1 (L) 08/12/2023 0359   AST 19 08/12/2023 0359   ALT 12 08/12/2023 0359   ALKPHOS 77 08/12/2023 0359   BILITOT 0.3 08/12/2023 0359   EGFR 24.0 06/29/2023 1016   EGFR 24 06/29/2023 0000   GFRNONAA 25 (L) 08/14/2023 0338     Diabetic Labs (most recent): Lab Results  Component Value Date   HGBA1C 7.6 (A) 10/05/2023   HGBA1C 8.7 06/29/2023   HGBA1C 8.5 (A) 02/02/2023     Assessment & Plan:   1. Type 2 diabetes mellitus with other specified complication, unspecified whether long term insulin  use (HCC) (Primary)  - Yancy Knoble has currently uncontrolled symptomatic type 2 DM since  58 years of age.  Patient brought his meter showing average blood glucose of 185 for the last 14 days, however he monitors only once a day.  His point-of-care A1c is 7.6% improving from 8.7%.  He did not document any hypoglycemia.    Recent labs reviewed. 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  He reports that he was diagnosed with diabetes right after his transplant.   - I had a long discussion with him about the possible risk factors and  the pathology behind diabetes and its complications. -his diabetes is complicated by CKD and he remains at a high risk for more acute and chronic complications which include CAD, CVA, CKD, retinopathy, and neuropathy. These are all discussed in detail with him.  - I discussed all available options of managing his diabetes including de-escalation of medications. I have counseled him on Food as Medicine by adopting a Whole Food , Plant Predominant  (  WFPP) nutrition as recommended by American College of Lifestyle Medicine. Patient is encouraged to switch to  unprocessed or minimally processed  complex starch, adequate protein intake (mainly plant source), minimal liquid fat,  plenty of fruits, and vegetables.  -  he is advised to stick to a routine mealtimes to eat 3 complete meals a day and snack only when necessary (to snack only to correct hypoglycemia BG <70 day time or <100 at night).  - he acknowledges that there is a room for improvement in his food and drink choices.  - Further Specific Suggestion is made for him to avoid simple carbohydrates  from his diet including Cakes, Sweet Desserts, Ice Cream, Soda (diet and regular), Sweet Tea, Candies, Chips, Cookies, Store Bought Juices, Alcohol ,  Artificial Sweeteners,  Coffee Creamer, and Sugar-free Products. This will help patient to have more stable blood glucose profile and potentially avoid unintended weight gain.  - he will be scheduled with Penny Crumpton, RDN, CDE for individualized diabetes education.  - I have approached him with the following individualized plan to manage  his diabetes and patient agrees:    - He is responding to basal insulin .  I discussed and increased his Tresiba  to 30 units nightly, associated with monitoring of blood glucose twice a day-daily before breakfast and at bedtime.    - he is encouraged to call clinic for blood glucose levels less than 70 or above 200 mg /dl. - He will continue to benefit from Jardiance , advised to continue Jardiance  10 mg p.o. daily at breakfast. -This patient will benefit from a CGM.  I discussed and prescribed the freestyle libre device for him. - Specific targets for  A1c;  LDL, HDL,  and Triglycerides were discussed with the patient.  2) Blood Pressure /Hypertension:  - His blood pressure is controlled to target. He is advised to continue enalapril  5 mg p.o. daily, amlodipine  10 mg p.o. daily,  Coreg  6.25 mg p.o. twice daily.  I did not adjust his blood pressure medications, defer to nephrology.  He is advised to be consistent on his current blood pressure medications.  3) Lipids/Hyperlipidemia: He does not have recent lipid panel to review.  He  will be considered for fasting lipid panel on subsequent visits.  In the meantime, he is advised to continue atorvastatin 20 mg p.o. nightly.       Side effects and precautions discussed with him.  4)  Weight/Diet:  Body mass index is 27.64 kg/m.  -   he is not a candidate for major weight loss. I discussed with him the fact that loss of 5 - 10% of his  current body weight will have the most impact on his diabetes management.  The above detailed  ACLM recommendations for nutrition, exercise, sleep, social life, avoidance of risky substances, the need for restorative sleep  information is also detailed on discharge instructions.  5) Chronic Care/Health Maintenance:  -he  is on ACEI/ARB and Statin medications and  is encouraged to initiate and continue to follow up with Ophthalmology, Dentist,  Podiatrist at least yearly or according to recommendations, and advised to   stay away from smoking. I have recommended yearly flu vaccine and pneumonia vaccine at least every 5 years; moderate intensity exercise for up to 150 minutes weekly; and  sleep for 7- 9 hours a day.  - he is  advised to maintain close follow up with Tobie Suzzane POUR, MD for primary care needs, as well as his other providers for  optimal and coordinated care.  I spent  26  minutes in the care of the patient today including review of labs from CMP, Lipids, Thyroid Function, Hematology (current and previous including abstractions from other facilities); face-to-face time discussing  his blood glucose readings/logs, discussing hypoglycemia and hyperglycemia episodes and symptoms, medications doses, his options of short and long term treatment based on the latest standards of care / guidelines;  discussion about incorporating lifestyle medicine;  and documenting the encounter. Risk reduction counseling performed per USPSTF guidelines to reduce cardiovascular risk factors.     Please refer to Patient Instructions for Blood Glucose Monitoring and  Insulin /Medications Dosing Guide  in media tab for additional information. Please  also refer to  Patient Self Inventory in the Media  tab for reviewed elements of pertinent patient history.  Daril Canada participated in the discussions, expressed understanding, and voiced agreement with the above plans.  All questions were answered to his satisfaction. he is encouraged to contact clinic should he have any questions or concerns prior to his return visit.   Follow up plan: - Return in about 4 months (around 02/04/2024) for A1c -NV.  Ranny Earl, MD [facility] [address] [place], [place] [address] Phone: [phone]  Fax: [phone]    10/05/2023, 12:00 PM  This note was partially dictated with voice recognition software. Similar sounding words can be transcribed inadequately or may not  be corrected upon review.

## 2023-10-05 NOTE — Patient Instructions (Signed)
 Start taking insulin  30 units daily Get CGM Eat breakfast daily. Get in touch with transplant team today.

## 2023-10-05 NOTE — Progress Notes (Signed)
 Medical Nutrition Therapy  Appointment Start time:  [phone]     Appointment End time:  0950 Primary concerns today: Dm Type 2, Kidney Transplant Referral diagnosis: E11.69 Preferred learning style: Visual and hands on (auditory, visual, hands on, no preference indicated) Learning readiness: Ready  NUTRITION ASSESSMENT  DM Follow up. Saw Dr. Lenis today  Was taking 20 units of Lantus  and will increase to 30 units now per Dr. Lenis. A1C down to 7.6%. Hasn't been able to get in touch with Transplant team since missing his appt in February. Provided the number again and strongly encouraged him to contact them for follow.  Most recent Creatine was 2.83  mg/dl.      07/2024 lab results. Still skipping breakfast at times. Has been eating more fruits and vegetables. Works 2 jobs. Trying to get a CGM to help with more awareness of low blood sugars. Drinking water and some gatorade lite. Started walking some and feels better. Wt stable. Goals set previously Eat breakfast daily-not doing Call transplant office to schedule follow up appt.-hasn't done yet Get A1C down to 7%--imp;roved 7.6%. Don't skip meals-working on it. Work on meal planning and prepping.-Still working on it.   Wt Readings from Last 3 Encounters:  10/05/23 161 lb (73 kg)  10/05/23 161 lb (73 kg)  09/21/23 159 lb (72.1 kg)   Ht Readings from Last 3 Encounters:  10/05/23 5' 4 (1.626 m)  10/05/23 5' 4 (1.626 m)  09/21/23 5' 4 (1.626 m)   Body mass index is 27.64 kg/m. @BMIFA @ Facility age limit for growth %iles is 20 years. Facility age limit for growth %iles is 20 years.    Lab Results  Component Value Date   HGBA1C 7.6 (A) 10/05/2023      Latest Ref Rng & Units 08/14/2023    3:38 AM 08/13/2023    4:46 AM 08/12/2023    3:59 AM  CMP  Glucose 70 - 99 mg/dL 823  824  95   BUN 6 - 20 mg/dL 52  39  33   Creatinine 0.61 - 1.24 mg/dL 7.16  7.38  7.60   Sodium 135 - 145 mmol/L 139  138  140   Potassium 3.5 - 5.1 mmol/L  4.2  4.6  4.5   Chloride 98 - 111 mmol/L 109  109  111   CO2 22 - 32 mmol/L 21  19  22    Calcium 8.9 - 10.3 mg/dL 8.6  8.9  8.6   Total Protein 6.5 - 8.1 g/dL   6.8   Total Bilirubin 0.0 - 1.2 mg/dL   0.3   Alkaline Phos 38 - 126 U/L   77   AST 15 - 41 U/L   19   ALT 0 - 44 U/L   12    Lipid Panel  No results found for: CHOL, TRIG, HDL, CHOLHDL, VLDL, LDLCALC, LDLDIRECT, LABVLDL  Notable Signs/Symptoms:  Lifestyle & Dietary Hx Married and lives with his wife. 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  Estimated daily fluid intake: 40 oz Supplements:  Sleep: poor Stress / self-care: his health Current average weekly physical activity: ADL  24-Hr Dietary Recall Skipped breakfast L) Green beans, pintos and cabbage,  [place] due to blood sugar dropping-- usually water D) Chicken, shrimp, mashed potatoes, water FBS--110's    Needs to check BSs at night.  Taking  20 units but increasing to 30 units at night per Dr. Lenis.   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  Estimated Energy Needs Calories: 1800 Carbohydrate: 200g Protein: 135g Fat: 50g   NUTRITION DIAGNOSIS  NB-1.1 Food and nutrition-related knowledge deficit As related to Diabetes Type 2 and CKD.  As evidenced by A1C 8.4 and.eGFR 33   NUTRITION INTERVENTION  Nutrition education (E-1) on the following topics:  Nutrition and Diabetes education provided on My Plate, CHO counting, meal planning, portion sizes, timing of meals, avoiding snacks between meals unless having a low blood sugar, target ranges for A1C and blood sugars, signs/symptoms and treatment of hyper/hypoglycemia, monitoring blood sugars, taking medications as prescribed, benefits of exercising 30 minutes per day and prevention of complications of DM.  Lifestyle Medicine  - Whole Food, Plant Predominant Nutrition is highly recommended: Eat Plenty of vegetables, Mushrooms, fruits, Legumes, Whole  Grains, Nuts, seeds in lieu of processed meats, processed snacks/pastries red meat, poultry, eggs.    -It is better to avoid simple carbohydrates including: Cakes, Sweet Desserts, Ice Cream, Soda (diet and regular), Sweet Tea, Candies, Chips, Cookies, Store Bought Juices, Alcohol in Excess of  1-2 drinks a day, Lemonade,  Artificial Sweeteners, Doughnuts, Coffee Creamers, Sugar-free Products, etc, etc.  This is not a complete list.....  Exercise: If you are able: 30 -60 minutes a day ,4 days a week, or 150 minutes a week.  The longer the better.  Combine stretch, strength, and aerobic activities.  If you were told in the past that you have high risk for cardiovascular diseases, you may seek evaluation by your heart doctor prior to initiating moderate to intense exercise programs.  Chronic kidney disease  Handouts Provided Include  Lifestyle Medicine Blood sugars log  Learning Style & Readiness for Change Teaching method utilized: Visual & Auditory  Demonstrated degree of understanding via: Teach Back  Barriers to learning/adherence to lifestyle change: commitment to being compliant to taking medications and diet restrictions  Goals Established by Pt Start taking insulin  30 units daily Get CGM Eat breakfast daily. Get in touch with transplant team today.  MONITORING & EVALUATION Dietary intake, weekly physical activity, and blood sguars in 3 month.  Next Steps  Patient is to start taking medication as prescribed.SABRA

## 2023-10-05 NOTE — Telephone Encounter (Signed)
 Pharmacy Patient Advocate Encounter   Received notification from CoverMyMeds that prior authorization for Freestyle libre 3 plus sensor is required/requested.   Insurance verification completed.   The patient is insured through [facility] .   Per test claim: PA required; PA submitted to above mentioned insurance via Latent Key/confirmation #/EOC [facility] Status is pending

## 2023-10-05 NOTE — Patient Instructions (Signed)

## 2023-10-07 ENCOUNTER — Other Ambulatory Visit (HOSPITAL_BASED_OUTPATIENT_CLINIC_OR_DEPARTMENT_OTHER): Payer: Self-pay

## 2023-10-08 ENCOUNTER — Other Ambulatory Visit (HOSPITAL_BASED_OUTPATIENT_CLINIC_OR_DEPARTMENT_OTHER): Payer: Self-pay

## 2023-10-09 ENCOUNTER — Other Ambulatory Visit (HOSPITAL_BASED_OUTPATIENT_CLINIC_OR_DEPARTMENT_OTHER): Payer: Self-pay

## 2023-10-11 ENCOUNTER — Other Ambulatory Visit (HOSPITAL_BASED_OUTPATIENT_CLINIC_OR_DEPARTMENT_OTHER): Payer: Self-pay

## 2023-10-11 NOTE — Telephone Encounter (Signed)
 Pharmacy Patient Advocate Encounter  Received notification from [employer] that Prior Authorization for [facility] 3 plus sensor has been DENIED.  Full denial letter will be uploaded to the media tab. See denial reason below.     PA #/Case ID/Reference #: [id number]

## 2023-10-12 ENCOUNTER — Other Ambulatory Visit (HOSPITAL_BASED_OUTPATIENT_CLINIC_OR_DEPARTMENT_OTHER): Payer: Self-pay

## 2023-10-13 ENCOUNTER — Other Ambulatory Visit (HOSPITAL_BASED_OUTPATIENT_CLINIC_OR_DEPARTMENT_OTHER): Payer: Self-pay

## 2023-10-13 NOTE — Telephone Encounter (Signed)
 Left a message requesting pt return call to the office.

## 2023-10-14 ENCOUNTER — Other Ambulatory Visit (HOSPITAL_BASED_OUTPATIENT_CLINIC_OR_DEPARTMENT_OTHER): Payer: Self-pay

## 2023-10-25 ENCOUNTER — Encounter (HOSPITAL_COMMUNITY)
Admission: RE | Admit: 2023-10-25 | Discharge: 2023-10-25 | Disposition: A | Discharge status: Home / Self Care | Source: Ambulatory Visit | Attending: Urology | Admitting: Urology

## 2023-10-25 ENCOUNTER — Encounter (HOSPITAL_COMMUNITY): Payer: Self-pay

## 2023-10-25 ENCOUNTER — Other Ambulatory Visit: Payer: Self-pay

## 2023-10-28 ENCOUNTER — Encounter (HOSPITAL_COMMUNITY): Payer: Self-pay | Admitting: Urology

## 2023-10-28 ENCOUNTER — Ambulatory Visit (HOSPITAL_COMMUNITY): Admitting: Certified Registered Nurse Anesthetist

## 2023-10-28 ENCOUNTER — Encounter (HOSPITAL_COMMUNITY)
Admission: RE | Disposition: A | Discharge status: Home / Self Care | Payer: Self-pay | Source: Home / Self Care | Attending: Urology

## 2023-10-28 ENCOUNTER — Ambulatory Visit (HOSPITAL_COMMUNITY)
Admission: RE | Admit: 2023-10-28 | Discharge: 2023-10-28 | Disposition: A | Discharge status: Home / Self Care | Attending: Urology | Admitting: Urology

## 2023-10-28 DIAGNOSIS — N2581 Secondary hyperparathyroidism of renal origin: Secondary | ICD-10-CM | POA: Diagnosis not present

## 2023-10-28 DIAGNOSIS — N433 Hydrocele, unspecified: Secondary | ICD-10-CM | POA: Insufficient documentation

## 2023-10-28 DIAGNOSIS — Z7984 Long term (current) use of oral hypoglycemic drugs: Secondary | ICD-10-CM | POA: Insufficient documentation

## 2023-10-28 DIAGNOSIS — E119 Type 2 diabetes mellitus without complications: Secondary | ICD-10-CM | POA: Diagnosis not present

## 2023-10-28 DIAGNOSIS — N432 Other hydrocele: Secondary | ICD-10-CM | POA: Diagnosis not present

## 2023-10-28 DIAGNOSIS — M51362 Other intervertebral disc degeneration, lumbar region with discogenic back pain and lower extremity pain: Secondary | ICD-10-CM

## 2023-10-28 DIAGNOSIS — I509 Heart failure, unspecified: Secondary | ICD-10-CM | POA: Insufficient documentation

## 2023-10-28 DIAGNOSIS — G894 Chronic pain syndrome: Secondary | ICD-10-CM

## 2023-10-28 DIAGNOSIS — Z940 Kidney transplant status: Secondary | ICD-10-CM | POA: Insufficient documentation

## 2023-10-28 DIAGNOSIS — I110 Hypertensive heart disease with heart failure: Secondary | ICD-10-CM | POA: Diagnosis not present

## 2023-10-28 DIAGNOSIS — Q613 Polycystic kidney, unspecified: Secondary | ICD-10-CM | POA: Diagnosis not present

## 2023-10-28 DIAGNOSIS — Z794 Long term (current) use of insulin: Secondary | ICD-10-CM | POA: Insufficient documentation

## 2023-10-28 HISTORY — PX: HYDROCELE EXCISION: SHX482

## 2023-10-28 LAB — GLUCOSE, CAPILLARY
Glucose-Capillary: 196 mg/dL — ABNORMAL HIGH (ref 70–99)
Glucose-Capillary: 212 mg/dL — ABNORMAL HIGH (ref 70–99)

## 2023-10-28 SURGERY — HYDROCELECTOMY
Anesthesia: Monitor Anesthesia Care | Site: Scrotum | Laterality: Right

## 2023-10-28 MED ORDER — OXYCODONE HCL 5 MG PO TABS
5.0000 mg | ORAL_TABLET | Freq: Four times a day (QID) | ORAL | 0 refills | Status: DC | PRN
Start: 2023-10-28 — End: 2023-11-02

## 2023-10-28 MED ORDER — ORAL CARE MOUTH RINSE: 15.0000 mL | Freq: Once | OROMUCOSAL | Status: AC

## 2023-10-28 MED ORDER — MIDAZOLAM HCL 2 MG/2ML IJ SOLN
INTRAMUSCULAR | Status: DC | PRN
  Administered 2023-10-28: 2 mg via INTRAVENOUS

## 2023-10-28 MED ORDER — LIDOCAINE 2% (20 MG/ML) 5 ML SYRINGE
INTRAMUSCULAR | Status: AC
  Filled 2023-10-28: qty 5

## 2023-10-28 MED ORDER — LIDOCAINE 2% (20 MG/ML) 5 ML SYRINGE
INTRAMUSCULAR | Status: DC | PRN
  Administered 2023-10-28: 60 mg via INTRAVENOUS

## 2023-10-28 MED ORDER — PROPOFOL 10 MG/ML IV BOLUS
INTRAVENOUS | Status: DC | PRN
  Administered 2023-10-28: 90 mg via INTRAVENOUS

## 2023-10-28 MED ORDER — PROPOFOL 10 MG/ML IV BOLUS
INTRAVENOUS | Status: AC
  Filled 2023-10-28: qty 20

## 2023-10-28 MED ORDER — PHENYLEPHRINE 80 MCG/ML (10ML) SYRINGE FOR IV PUSH (FOR BLOOD PRESSURE SUPPORT)
PREFILLED_SYRINGE | INTRAVENOUS | Status: AC
  Filled 2023-10-28: qty 10

## 2023-10-28 MED ORDER — PHENYLEPHRINE 80 MCG/ML (10ML) SYRINGE FOR IV PUSH (FOR BLOOD PRESSURE SUPPORT)
PREFILLED_SYRINGE | INTRAVENOUS | Status: DC | PRN
  Administered 2023-10-28 (×3): 80 ug via INTRAVENOUS

## 2023-10-28 MED ORDER — CEFAZOLIN SODIUM-DEXTROSE 2-4 GM/100ML-% IV SOLN
2.0000 g | INTRAVENOUS | Status: AC
  Administered 2023-10-28: 2 g via INTRAVENOUS

## 2023-10-28 MED ORDER — 0.9 % SODIUM CHLORIDE (POUR BTL) OPTIME
TOPICAL | Status: DC | PRN
  Administered 2023-10-28: 1000 mL

## 2023-10-28 MED ORDER — CEFAZOLIN SODIUM-DEXTROSE 2-4 GM/100ML-% IV SOLN
INTRAVENOUS | Status: AC
  Filled 2023-10-28: qty 100

## 2023-10-28 MED ORDER — BUPIVACAINE HCL (PF) 0.25 % IJ SOLN
INTRAMUSCULAR | Status: AC
  Filled 2023-10-28: qty 30

## 2023-10-28 MED ORDER — CHLORHEXIDINE GLUCONATE 0.12 % MT SOLN: 15.0000 mL | Freq: Once | OROMUCOSAL | Status: AC

## 2023-10-28 MED ORDER — ONDANSETRON HCL 4 MG/2ML IJ SOLN
INTRAMUSCULAR | Status: DC | PRN
  Administered 2023-10-28: 4 mg via INTRAVENOUS

## 2023-10-28 MED ORDER — CHLORHEXIDINE GLUCONATE 0.12 % MT SOLN
OROMUCOSAL | Status: AC
  Administered 2023-10-28: 15 mL via OROMUCOSAL
  Filled 2023-10-28: qty 15

## 2023-10-28 MED ORDER — BUPIVACAINE HCL (PF) 0.25 % IJ SOLN
INTRAMUSCULAR | Status: DC | PRN
  Administered 2023-10-28: 10 mL

## 2023-10-28 MED ORDER — FENTANYL CITRATE (PF) 100 MCG/2ML IJ SOLN
INTRAMUSCULAR | Status: AC
  Filled 2023-10-28: qty 2

## 2023-10-28 MED ORDER — FENTANYL CITRATE (PF) 250 MCG/5ML IJ SOLN
INTRAMUSCULAR | Status: DC | PRN
  Administered 2023-10-28 (×4): 25 ug via INTRAVENOUS

## 2023-10-28 MED ORDER — ONDANSETRON HCL 4 MG/2ML IJ SOLN
INTRAMUSCULAR | Status: AC
  Filled 2023-10-28: qty 2

## 2023-10-28 MED ORDER — SODIUM CHLORIDE 0.9 % IV SOLN: INTRAVENOUS | Status: DC | PRN

## 2023-10-28 MED ORDER — MIDAZOLAM HCL 2 MG/2ML IJ SOLN
INTRAMUSCULAR | Status: AC
  Filled 2023-10-28: qty 2

## 2023-10-28 MED ORDER — LACTATED RINGERS IV SOLN
INTRAVENOUS | Status: DC
Start: 2023-10-28 — End: 2023-10-28

## 2023-10-28 SURGICAL SUPPLY — 24 items
BLADE SURG 15 STRL LF DISP TIS (BLADE) ×2 IMPLANT
COVER LIGHT HANDLE STERIS (MISCELLANEOUS) ×4 IMPLANT
DERMABOND ADVANCED .7 DNX12 (GAUZE/BANDAGES/DRESSINGS) ×2 IMPLANT
ELECTRODE REM PT RTRN 9FT ADLT (ELECTROSURGICAL) ×2 IMPLANT
GAUZE SPONGE 4X4 12PLY STRL (GAUZE/BANDAGES/DRESSINGS) ×4 IMPLANT
GLOVE BIO SURGEON STRL SZ8 (GLOVE) ×2 IMPLANT
GLOVE BIOGEL PI IND STRL 7.0 (GLOVE) ×4 IMPLANT
GLOVE BIOGEL PI IND STRL 8 (GLOVE) ×2 IMPLANT
GOWN STRL REUS W/TWL LRG LVL3 (GOWN DISPOSABLE) ×2 IMPLANT
GOWN STRL REUS W/TWL XL LVL3 (GOWN DISPOSABLE) ×2 IMPLANT
KIT TURNOVER KIT A (KITS) ×2 IMPLANT
MANIFOLD NEPTUNE II (INSTRUMENTS) ×2 IMPLANT
NDL HYPO 25X1 1.5 SAFETY (NEEDLE) ×2 IMPLANT
NEEDLE HYPO 25X1 1.5 SAFETY (NEEDLE) ×1 IMPLANT
NS IRRIG 1000ML POUR BTL (IV SOLUTION) ×2 IMPLANT
PACK MINOR (CUSTOM PROCEDURE TRAY) ×2 IMPLANT
PAD ARMBOARD POSITIONER FOAM (MISCELLANEOUS) ×2 IMPLANT
POSITIONER HEAD 8X9X4 ADT (SOFTGOODS) ×2 IMPLANT
SET BASIN LINEN APH (SET/KITS/TRAYS/PACK) ×2 IMPLANT
SUPPORT SCROTAL LG STRP (MISCELLANEOUS) ×2 IMPLANT
SUPPORTER AHLETIC TETRA LG (SOFTGOODS) IMPLANT
SUT MNCRL AB 4-0 PS2 18 (SUTURE) ×2 IMPLANT
SUT VIC AB 3-0 SH 27X BRD (SUTURE) ×2 IMPLANT
SYR CONTROL 10ML LL (SYRINGE) ×2 IMPLANT

## 2023-10-28 NOTE — Anesthesia Procedure Notes (Signed)
 Procedure Name: LMA Insertion Date/Time: 10/28/2023 9:39 AM  Performed by: Elaine Delon CROME, CRNAPre-anesthesia Checklist: Emergency Drugs available, Patient identified, Suction available and Patient being monitored Patient Re-evaluated:Patient Re-evaluated prior to induction Oxygen Delivery Method: Circle system utilized Preoxygenation: Pre-oxygenation with 100% oxygen Induction Type: IV induction LMA: LMA inserted LMA Size: 4.0 Number of attempts: 1 Placement Confirmation: positive ETCO2 and breath sounds checked- equal and bilateral Tube secured with: Tape Dental Injury: Teeth and Oropharynx as per pre-operative assessment

## 2023-10-28 NOTE — Op Note (Addendum)
 Preoperative diagnosis: Right Hydrocele  Postoperative diagnosis: Same  Procedure: 1. Excision of right appendix testis 2. Right hydrocelectomy  Attending: Belvie Clara, MD  Anesthesia: General  History of blood loss: Minimal  Antibiotics: ancef   Drains: none  Specimens: 1. Right hydrocele sac   Findings: 300cc hydrocele  Indications: Patient is a 57 year old male with a history of right hydrocele that was growing in size and causing him pain with walking.  We discussed the treatment options including observation versus excision after discussing treatment options he proceed with excision.   Procedure in detail: Prior to procedure consent was obtained.  Patient was brought to the operating room and a brief timeout was done to ensure correct patient, correct procedure, correct site.  General anesthesia was administered and patient was placed in supine position.  His genitalia was then prepped and draped in usual sterile fashion.  A 3 cm incision was made in the right hemiscrotum.  We dissected down to the tunica and then incised the tunica. A large hydrocele was encountered and was drained. We then excised the hydrocele sac and then over sewed the edge with 2-0 Vicryl in a running fashion. We then excised the right appendix testis. Hemostasis was then obtained with electrocautery. We then closed the defect in the epididymis with 3-0 vicryl in a running fashion. We then returned the testis to the left hemiscrotum and closed the overlying dartos with 3-0 vicryl in a running fashion. The skin was then closed with 4-0 monocryl in a running fashion. Dermabond was placed on the incision.  A dressing was then applied to the incision.  We then placed a scrotal fluff and this then concluded the procedure which was well tolerated by the patient.  Complications: None  Condition: Stable, extubated, transferred to PACU.  Plan: Patient is to be discharged home.  He is to follow up in 2 weeks for  wound check.

## 2023-10-28 NOTE — H&P (Signed)
 CC: Scrotal swelling     HPI: 58 yo male here for E/M of (B) hydroceles. He is referred by Dr Tobie.   Medical comorbidities include hypertension, anemia of chronic kidney disease, secondary hyperparathyroidism, status post cadaveric renal transplant, hypertension, diabetes mellitus.   The patient noted onset of right sided scrotal swelling 3 to 4 months ago.  It has been fairly stable in size since that time.  It does impact his work and daily life and that it is uncomfortable and gets in the way.       PMH:     Past Medical History:  Diagnosis Date   DDD (degenerative disc disease), lumbar     Diabetes mellitus     Hypertension     Polycystic kidney disease            Surgical History:      Past Surgical History:  Procedure Laterality Date   NEPHRECTOMY TRANSPLANTED ORGAN              Home Medications:  Allergies as of 09/14/2023   No Known Allergies         Medication List           Accurate as of September 13, 2023  8:03 AM. If you have any questions, ask your nurse or doctor.              acetaminophen  500 MG tablet Commonly known as: TYLENOL  Take 500 mg by mouth every 8 (eight) hours as needed for moderate pain (pain score 4-6).    amLODipine  10 MG tablet Commonly known as: NORVASC  Take 1 tablet (10 mg total) by mouth daily.    aspirin  EC 81 MG tablet Take 81 mg by mouth daily.    BD Pen Needle Nano U/F 32G X 4 MM Misc Generic drug: Insulin  Pen Needle 1 each by Does not apply route 4 (four) times daily.    carvedilol  6.25 MG tablet Commonly known as: COREG  Take 1 tablet (6.25 mg total) by mouth 2 (two) times daily.    furosemide  20 MG tablet Commonly known as: Lasix  Take 1 tablet (20 mg total) by mouth daily as needed for fluid or edema.    guaiFENesin -dextromethorphan  100-10 MG/5ML syrup Commonly known as: ROBITUSSIN DM Take 5 mLs by mouth every 4 (four) hours as needed for cough.    hydrALAZINE  25 MG tablet Commonly known as:  APRESOLINE  Take 1 tablet (25 mg total) by mouth 3 (three) times daily.    isosorbide  mononitrate 30 MG 24 hr tablet Commonly known as: IMDUR  Take 0.5 tablets (15 mg total) by mouth daily.    Jardiance  10 MG Tabs tablet Generic drug: empagliflozin  Take 10 mg by mouth daily.    mycophenolate  250 MG capsule Commonly known as: CELLCEPT  Take 1,000 mg by mouth 2 (two) times daily.    oxyCODONE  5 MG immediate release tablet Commonly known as: Oxy IR/ROXICODONE  Take 1 tablet (5 mg total) by mouth every 6 (six) hours as needed.    predniSONE  5 MG tablet Commonly known as: DELTASONE  Take 5 mg by mouth daily.    simvastatin  20 MG tablet Commonly known as: ZOCOR  Take 20 mg by mouth daily.    tacrolimus  1 MG capsule Commonly known as: PROGRAF  Take 2 mg by mouth 2 (two) times daily.    tetrahydrozoline-zinc 0.05-0.25 % ophthalmic solution Commonly known as: VISINE-AC Place 2 drops into both eyes 3 (three) times daily as needed. Dry/Red Eyes    Tresiba  FlexTouch 100 UNIT/ML  FlexTouch Pen Generic drug: insulin  degludec Inject 20 Units into the skin at bedtime.    True Metrix Blood Glucose Test test strip Generic drug: glucose blood Use as instructed             Allergies:  Allergies  No Known Allergies     Family History:      Family History  Problem Relation Age of Onset   Diabetes Mother     Kidney disease Mother     Stroke Mother     Kidney disease Father     Hypertension Father     Stroke Father     Heart disease Father            Social History:  reports that he has never smoked. He has never used smokeless tobacco. He reports that he does not drink alcohol and does not use drugs.   ROS: All other review of systems were reviewed and are negative except what is noted above in HPI   Physical Exam: There were no vitals taken for this visit.  Constitutional:  Alert and oriented, No acute distress. HEENT: [place] AT, moist mucus membranes.  Trachea midline, no  masses. Cardiovascular: No clubbing, cyanosis, or edema. Respiratory: Normal respiratory effort, no increased work of breathing. GI: No inguinal hernias GU: Normal phallus.  large right hydrocele.  Right testicle nonpalpable.  Left testicle, cord and epididymal structures are normal.  Prostate 30 g smooth no nodules no induration.  Lymph: No cervical or inguinal lymphadenopathy. Skin: No rashes, bruises or suspicious lesions. Neurologic: Grossly intact, no focal deficits, moving all 4 extremities. Psychiatric: Normal mood and affect.   Laboratory Data: Recent Labs       Lab Results  Component Value Date    WBC 6.6 08/14/2023    HGB 11.0 (L) 08/14/2023    HCT 35.6 (L) 08/14/2023    MCV 86.2 08/14/2023    PLT 184 08/14/2023        Recent Labs       Lab Results  Component Value Date    CREATININE 2.83 (H) 08/14/2023        Recent Labs  No results found for: PSA     Recent Labs  No results found for: TESTOSTERONE     Recent Labs       Lab Results  Component Value Date    HGBA1C 8.7 06/29/2023        Urinalysis--clear     Pertinent Imaging: Scrotal ultrasound images were independently reviewed   Assessment:  1.  Large, symptomatic right hydrocele.  Testicle normal upon scrotal ultrasound on that side   2.  Screening for prostate cancer.  I do not see any evidence of a PSA drawn in the past   Plan:   1.  The patient would like to have hydrocelectomy.  I discussed the procedure, risks and complications with him.  He knows that he may have a drain temporarily afterwards

## 2023-10-28 NOTE — Transfer of Care (Signed)
 Immediate Anesthesia Transfer of Care Note  Patient: [name]  Procedure(s) Performed: HYDROCELECTOMY (Right: Scrotum)  Patient Location: PACU  Anesthesia Type:General  Level of Consciousness: drowsy  Airway & Oxygen Therapy: Patient Spontanous Breathing and Patient connected to face mask oxygen  Post-op Assessment: Report given to RN and Post -op Vital signs reviewed and stable  Post vital signs: Reviewed and stable  Last Vitals:  Vitals Value Taken Time  BP 175/101   Temp 98.3   Pulse 81 10/28/23 10:17  Resp 13 10/28/23 10:17  SpO2 100 % 10/28/23 10:17  Vitals shown include unfiled device data.  Last Pain:  Vitals:   10/28/23 0903  PainSc: 3       Patients Stated Pain Goal: 2 (10/28/23 0903)  Complications: No notable events documented.

## 2023-10-28 NOTE — Addendum Note (Signed)
 Addendum  created 10/28/23 1318 by Elaine Delon CROME, CRNA   Flowsheet accepted, Intraprocedure Flowsheets edited

## 2023-10-28 NOTE — Anesthesia Preprocedure Evaluation (Addendum)
 Anesthesia Evaluation  Patient identified by MRN, date of birth, ID band Patient awake    Reviewed: Allergy & Precautions, H&P , NPO status , Patient's Chart, lab work & pertinent test results  Airway Mallampati: II  TM Distance: >3 FB Neck ROM: Full    Dental no notable dental hx.    Pulmonary pneumonia   Pulmonary exam normal breath sounds clear to auscultation       Cardiovascular hypertension, +CHF  Normal cardiovascular exam Rhythm:Regular Rate:Normal  EF 35-40% 2025   Neuro/Psych negative neurological ROS  negative psych ROS   GI/Hepatic negative GI ROS, Neg liver ROS,,,  Endo/Other  diabetes, Insulin  Dependent    Renal/GU Renal diseaseS/p renal translplant 2007  negative genitourinary   Musculoskeletal negative musculoskeletal ROS (+)    Abdominal   Peds negative pediatric ROS (+)  Hematology negative hematology ROS (+)   Anesthesia Other Findings   Reproductive/Obstetrics negative OB ROS                              Anesthesia Physical Anesthesia Plan  ASA: 4  Anesthesia Plan: MAC   Post-op Pain Management:    Induction:   PONV Risk Score and Plan:   Airway Management Planned: Nasal Cannula  Additional Equipment:   Intra-op Plan:   Post-operative Plan:   Informed Consent: I have reviewed the patients History and Physical, chart, labs and discussed the procedure including the risks, benefits and alternatives for the proposed anesthesia with the patient or authorized representative who has indicated his/her understanding and acceptance.     Dental advisory given  Plan Discussed with: CRNA  Anesthesia Plan Comments:         Anesthesia Quick Evaluation

## 2023-10-28 NOTE — Anesthesia Postprocedure Evaluation (Signed)
 Anesthesia Post Note  Patient: Jamorion Acrey  Procedure(s) Performed: HYDROCELECTOMY (Right: Scrotum)  Patient location during evaluation: PACU Anesthesia Type: MAC Level of consciousness: awake and alert Pain management: pain level controlled Vital Signs Assessment: post-procedure vital signs reviewed and stable Respiratory status: spontaneous breathing, nonlabored ventilation, respiratory function stable and patient connected to nasal cannula oxygen Cardiovascular status: stable and blood pressure returned to baseline Postop Assessment: no apparent nausea or vomiting Anesthetic complications: no   No notable events documented.   Last Vitals:  Vitals:   10/28/23 0904  BP: (!) 171/98  Resp: 19  Temp: 36.9 C  SpO2: 100%    Last Pain:  Vitals:   10/28/23 0903  PainSc: 3                  Lemario Chaikin Herschell

## 2023-10-29 ENCOUNTER — Encounter (HOSPITAL_COMMUNITY): Payer: Self-pay | Admitting: Urology

## 2023-10-29 LAB — SURGICAL PATHOLOGY

## 2023-11-02 ENCOUNTER — Other Ambulatory Visit: Payer: Self-pay | Admitting: Internal Medicine

## 2023-11-02 ENCOUNTER — Telehealth: Payer: Self-pay

## 2023-11-02 DIAGNOSIS — G894 Chronic pain syndrome: Secondary | ICD-10-CM

## 2023-11-02 DIAGNOSIS — M51362 Other intervertebral disc degeneration, lumbar region with discogenic back pain and lower extremity pain: Secondary | ICD-10-CM

## 2023-11-02 MED ORDER — OXYCODONE HCL 5 MG PO TABS
5.0000 mg | ORAL_TABLET | Freq: Four times a day (QID) | ORAL | 0 refills | Status: DC | PRN
Start: 2023-11-02 — End: 2023-12-06

## 2023-11-02 NOTE — Telephone Encounter (Signed)
 Copied from CRM [id number]. Topic: Clinical - Medication Refill >> Nov 02, 2023  1:12 PM Sophia H wrote: Medication: oxyCODONE  (OXY IR/ROXICODONE ) 5 MG immediate release tablet **Was prescribed by another provider, patient states he had back surgery and was wanting to know if PCP can prescribe for him.   Has the patient contacted their pharmacy? Yes, has to contact office.   This is the patient's preferred pharmacy:   [facility] - [place], [place] - [address] [place] [address] Phone: [phone] Fax: [phone]  Is this the correct pharmacy for this prescription? Yes If no, delete pharmacy and type the correct one.   Has the prescription been filled recently? Yes  Is the patient out of the medication? Yes  Has the patient been seen for an appointment in the last year OR does the patient have an upcoming appointment? Yes, has an appt 12/05.  Can we respond through MyChart? Yes  Agent: Please be advised that Rx refills may take up to 3 business days. We ask that you follow-up with your pharmacy.

## 2023-11-02 NOTE — Telephone Encounter (Signed)
 Patient needing to know the status of FMLA paperwork and return to work note.  Please advise.  Call:  [phone] or [phone] - wife

## 2023-11-03 NOTE — Telephone Encounter (Signed)
 Patient is aware FMLA paperwork is ready to be picked up.  His return to work date will be 09/29 pending his 09/24 post op.  Will fax this back once visit is complete.

## 2023-11-08 ENCOUNTER — Other Ambulatory Visit (HOSPITAL_BASED_OUTPATIENT_CLINIC_OR_DEPARTMENT_OTHER): Payer: Self-pay

## 2023-11-08 MED ORDER — GLUCOSE BLOOD VI STRP: ORAL_STRIP | 2 refills | Status: DC

## 2023-11-08 MED ORDER — INSULIN DEGLUDEC 100 UNIT/ML ~~LOC~~ SOPN
20.0000 [IU] | PEN_INJECTOR | Freq: Every day | SUBCUTANEOUS | 1 refills | Status: AC
Start: 2023-03-03 — End: ?
  Filled 2023-11-08: qty 15, 75d supply, fill #0

## 2023-11-10 ENCOUNTER — Other Ambulatory Visit (HOSPITAL_BASED_OUTPATIENT_CLINIC_OR_DEPARTMENT_OTHER): Payer: Self-pay

## 2023-11-10 ENCOUNTER — Encounter: Payer: Self-pay | Admitting: Urology

## 2023-11-10 ENCOUNTER — Encounter: Admitting: Urology

## 2023-11-10 ENCOUNTER — Ambulatory Visit (INDEPENDENT_AMBULATORY_CARE_PROVIDER_SITE_OTHER): Admitting: Urology

## 2023-11-10 VITALS — BP 156/81 | HR 87

## 2023-11-10 DIAGNOSIS — N433 Hydrocele, unspecified: Secondary | ICD-10-CM

## 2023-11-10 DIAGNOSIS — N432 Other hydrocele: Secondary | ICD-10-CM

## 2023-11-10 LAB — URINALYSIS, ROUTINE W REFLEX MICROSCOPIC
Bilirubin, UA: NEGATIVE
Ketones, UA: NEGATIVE
Leukocytes,UA: NEGATIVE
Nitrite, UA: NEGATIVE
Specific Gravity, UA: 1.02 (ref 1.005–1.030)
Urobilinogen, Ur: 0.2 mg/dL (ref 0.2–1.0)
pH, UA: 6 (ref 5.0–7.5)

## 2023-11-10 LAB — MICROSCOPIC EXAMINATION
Bacteria, UA: NONE SEEN
RBC, Urine: >30 /HPF — AB (ref 0–2)

## 2023-11-10 MED ORDER — DOXYCYCLINE HYCLATE 100 MG PO CAPS
100.0000 mg | ORAL_CAPSULE | Freq: Two times a day (BID) | ORAL | 0 refills | Status: DC
  Filled 2023-11-10: qty 14, 7d supply, fill #0

## 2023-11-10 NOTE — Telephone Encounter (Signed)
 Unable to reach patient by phone or leave a voicemail to inform patient that work note will be returned per MD with a return to work date of 11/15/2023.  Patient did not have voicemail set up at this time.

## 2023-11-10 NOTE — Progress Notes (Signed)
 11/10/2023 1:58 PM   [name] September 28, 1965 [id number]  Referring provider: Tobie Suzzane POUR, MD [address] [place],  [place] [address]  Followup right hydrocele   HPI: Mr [name] is a 58yo here for followup after right hydrocelectomy. He denies nay drainage from the incision. He has mild pain in his right testis.    PMH: Past Medical History:  Diagnosis Date   DDD (degenerative disc disease), lumbar    Diabetes mellitus    Hypertension    Polycystic kidney disease     Surgical History: Past Surgical History:  Procedure Laterality Date   HYDROCELE EXCISION Right 10/28/2023   Procedure: HYDROCELECTOMY;  Surgeon: Ethan Belvie CROME, MD;  Location: [facility];  Service: Urology;  Laterality: Right;   NEPHRECTOMY TRANSPLANTED ORGAN      Home Medications:  Allergies as of 11/10/2023   No Known Allergies      Medication List        Accurate as of November 10, 2023  1:58 PM. If you have any questions, ask your nurse or doctor.          acetaminophen  500 MG tablet Commonly known as: TYLENOL  Take 500 mg by mouth every 8 (eight) hours as needed for moderate pain (pain score 4-6).   amLODipine  10 MG tablet Commonly known as: NORVASC  Take 1 tablet (10 mg total) by mouth daily.   aspirin  EC 81 MG tablet Take 81 mg by mouth daily.   BD Pen Needle Nano U/F 32G X 4 MM Misc Generic drug: Insulin  Pen Needle 1 each by Does not apply route 4 (four) times daily.   Blood Glucose Monitoring Suppl Devi 1 each by Does not apply route in the morning, at noon, and at bedtime. May substitute to any manufacturer covered by patient's insurance.   glucose blood test strip Use daily to check sugar as instructed.   BLOOD GLUCOSE TEST STRIPS Strp 1 each by In Vitro route in the morning, at noon, and at bedtime. May substitute to any manufacturer covered by patient's insurance.   carvedilol  6.25 MG tablet Commonly known as: COREG  Take 1 tablet (6.25 mg total) by mouth 2  (two) times daily.   empagliflozin  10 MG Tabs tablet Commonly known as: JARDIANCE  Take 1 tablet (10 mg total) by mouth daily in the morning.   enalapril  20 MG tablet Commonly known as: VASOTEC  Take 1 tablet (20 mg total) by mouth daily.   FreeStyle [place] 3 [employer] Use as Directed   [employer] 3 Sensor Misc Use and directed and change every 14 days   furosemide  20 MG tablet Commonly known as: Lasix  Take 1 tablet (20 mg total) by mouth daily as needed for fluid or edema.   hydrALAZINE  25 MG tablet Commonly known as: APRESOLINE  Take 1 tablet (25 mg total) by mouth 3 (three) times daily. What changed: when to take this   insulin  degludec 100 UNIT/ML FlexTouch Pen Commonly known as: TRESIBA  Inject 20 Units into the skin at bedtime.   insulin  degludec 100 UNIT/ML FlexTouch Pen Commonly known as: TRESIBA  Inject 30 Units into the skin at bedtime.   isosorbide  mononitrate 30 MG 24 hr tablet Commonly known as: IMDUR  Take 0.5 tablets (15 mg total) by mouth daily.   mycophenolate  250 MG capsule Commonly known as: CELLCEPT  Take 1,000 mg by mouth 2 (two) times daily.   oxyCODONE  5 MG immediate release tablet Commonly known as: Oxy IR/ROXICODONE  Take 1 tablet (5 mg total) by mouth every 6 (six)  hours as needed.   predniSONE  5 MG tablet Commonly known as: DELTASONE  Take 5 mg by mouth daily.   simvastatin  20 MG tablet Commonly known as: ZOCOR  Take 1 tablet (20 mg total) by mouth daily.   tacrolimus  1 MG capsule Commonly known as: PROGRAF  Take 2 mg by mouth 2 (two) times daily.   tetrahydrozoline-zinc 0.05-0.25 % ophthalmic solution Commonly known as: VISINE-AC Place 2 drops into both eyes 3 (three) times daily as needed. Dry/Red Eyes        Allergies: No Known Allergies  Family History: Family History  Problem Relation Age of Onset   Diabetes Mother    Kidney disease Mother    Stroke Mother    Kidney disease Father    Hypertension Father    Stroke  Father    Heart disease Father     Social History:  reports that he has never smoked. He has never used smokeless tobacco. He reports that he does not drink alcohol and does not use drugs.  ROS: All other review of systems were reviewed and are negative except what is noted above in HPI  Physical Exam: BP (!) 156/81   Pulse 87   Constitutional:  Alert and oriented, No acute distress. HEENT: [place] AT, moist mucus membranes.  Trachea midline, no masses. Cardiovascular: No clubbing, cyanosis, or edema. Respiratory: Normal respiratory effort, no increased work of breathing. GI: Abdomen is soft, nontender, nondistended, no abdominal masses GU: No CVA tenderness.  Small right scrotal hematoma Lymph: No cervical or inguinal lymphadenopathy. Skin: No rashes, bruises or suspicious lesions. Neurologic: Grossly intact, no focal deficits, moving all 4 extremities. Psychiatric: Normal mood and affect.  Laboratory Data: Lab Results  Component Value Date   WBC 6.6 08/14/2023   HGB 11.0 (L) 08/14/2023   HCT 35.6 (L) 08/14/2023   MCV 86.2 08/14/2023   PLT 184 08/14/2023    Lab Results  Component Value Date   CREATININE 2.83 (H) 08/14/2023    No results found for: PSA  No results found for: TESTOSTERONE  Lab Results  Component Value Date   HGBA1C 7.6 (A) 10/05/2023    Urinalysis    Component Value Date/Time   COLORURINE YELLOW 10/22/2018 0036   APPEARANCEUR Clear 09/14/2023 1005   LABSPEC 1.023 10/22/2018 0036   PHURINE 5.0 10/22/2018 0036   GLUCOSEU 3+ (A) 09/14/2023 1005   HGBUR SMALL (A) 10/22/2018 0036   BILIRUBINUR Negative 09/14/2023 1005   KETONESUR 5 (A) 10/22/2018 0036   PROTEINUR 3+ (A) 09/14/2023 1005   PROTEINUR 100 (A) 10/22/2018 0036   UROBILINOGEN 0.2 11/14/2014 2134   NITRITE Negative 09/14/2023 1005   NITRITE NEGATIVE 10/22/2018 0036   LEUKOCYTESUR Negative 09/14/2023 1005   LEUKOCYTESUR NEGATIVE 10/22/2018 0036    Lab Results  Component Value Date    LABMICR See below: 09/14/2023   WBCUA None seen 09/14/2023   LABEPIT 0-10 09/14/2023   BACTERIA Few (A) 09/14/2023    Pertinent Imaging:  Results for orders placed in visit on 08/15/01  DG Abd 1 View  Narrative FINDINGS CLINICAL DATA:  PNEUMONIA.  CHEST AND ABDOMINAL PAIN. CHEST, TWO VIEWS PA AND LATERAL VIEWS WITH COMPARISON VIEWS OF 08/14/01 REVEALS A POOR INSPIRATORY EFFORT WITH THE HEART SIZE BEING ENLARGED, PARTICULARLY IN THE REGION OF THE LEFT VENTRICLE.  LOSS OF VOLUME IS NOTED IN THE LUNG BASES. IMPRESSION NO CHANGE.  CARDIOMEGALY WITH ATELECTASIS, PARTICULARLY ON THE RIGHT. ABDOMEN, ONE VIEW AN AP VIEW OF THE ABDOMEN REVEALS MILD TO MODERATE GASEOUS DILATATION OF  THE COLON, AS WELL AS PORTIONS OF THE SMALL BOWEL. IMPRESSION ILEUS OF THE COLON.  No results found for this or any previous visit.  No results found for this or any previous visit.  No results found for this or any previous visit.  No results found for this or any previous visit.  No results found for this or any previous visit.  No results found for this or any previous visit.  No results found for this or any previous visit.   Assessment & Plan:    1. Hydrocele, bilateral (Primary) -doxycycline  100mg  BID for 7 days -followup 6-8 weeks - Urinalysis, Routine w reflex microscopic   No follow-ups on file.  Belvie Clara, MD  [facility] [place]

## 2023-11-10 NOTE — Progress Notes (Unsigned)
 Cardiology Office Note    Date:  11/11/2023  ID:  [name], DOB [birth year], MRN [id number] Cardiologist: Alvan Carrier, MD Cardiology APP:  Johnson Laymon HERO, PA-C { : History of Present Illness:    [name] is a 58 y.o. male with past medical history of chronic HFrEF (EF 35-40% by echo in 07/2023), HTN, HLD, Type II DM and history of renal transplant with Stage III-IV CKD who presents to the office today for 6-week follow-up.  He was last examined by myself in 09/2023 following his admission for multifocal pneumonia during which he was found to have a cardiomyopathy with EF at 35 to 40%. At the time of follow-up, he reported occasional shortness of breath but symptoms had improved since his hospitalization. Denied any recent chest pain or palpitations  He was continued on Coreg  6.25 mg twice daily, Hydralazine  25 mg 3 times daily, Jardiance  10 mg daily and Enalapril  20 mg daily (dosing had been deferred to Nephrology).  Imdur  was titrated from 15 mg daily to 30 mg daily. Close follow-up was recommended for further titration of medical therapy with plans for a follow-up echocardiogram later this year.  In the interim, he did undergo operative repair of a right hydrocele on 10/28/2023 by Urology with no acute complications noted.  In talking with the patient today, he reports overall feeling well since last office visit. Having some discomfort following his recent hydrocele surgery but overall doing well from a cardiac perspective. Says that his breathing has improved along with his energy level. No specific chest pain, palpitations, orthopnea, PND or pitting edema.  Studies Reviewed:   EKG: EKG is not ordered today.  Echocardiogram: 07/2023 IMPRESSIONS     1. Global hypokinesis, worse in the anterior, inferior and septal walls .  Left ventricular ejection fraction, by estimation, is 35 to 40%. The left  ventricle has moderately decreased function. The left ventricle   demonstrates global hypokinesis. The left   ventricular internal cavity size was mildly dilated. There is mild left  ventricular hypertrophy. Left ventricular diastolic parameters are  consistent with Grade I diastolic dysfunction (impaired relaxation).   2. Right ventricular systolic function is normal. The right ventricular  size is normal.   3. Left atrial size was moderately dilated.   4. Mild mitral valve regurgitation.   5. The aortic valve is tricuspid. Aortic valve regurgitation is not  visualized. Aortic valve sclerosis/calcification is present, without any  evidence of aortic stenosis.   6. The inferior vena cava is normal in size with greater than 50%  respiratory variability, suggesting right atrial pressure of 3 mmHg.    Physical Exam:   VS:  BP 134/80 (BP Location: Right Arm, Cuff Size: Normal)   Pulse 83   Ht 5' 4 (1.626 m)   Wt 156 lb 9.6 oz (71 kg)   SpO2 98%   BMI 26.88 kg/m    Wt Readings from Last 3 Encounters:  11/11/23 156 lb 9.6 oz (71 kg)  10/28/23 160 lb 0.9 oz (72.6 kg)  10/25/23 160 lb (72.6 kg)     GEN: Well nourished, well developed male appearing  in no acute distress NECK: No JVD; No carotid bruits CARDIAC: RRR, no murmurs, rubs, gallops RESPIRATORY:  Clear to auscultation without rales, wheezing or rhonchi  ABDOMEN: Appears non-distended. No obvious abdominal masses. EXTREMITIES: No clubbing or cyanosis. No pitting edema.  Distal pedal pulses are 2+ bilaterally.   Assessment and Plan:   1. Chronic HFrEF (heart failure with  reduced ejection fraction) (HCC) - EF was reduced at 35 to 40% by echocardiogram in 07/2023. He has overall been doing well and denies any recent respiratory issues and says his energy level has continued to improve. Will arrange for a follow-up echocardiogram within the next 1 to 2 months for reassessment of his EF. Not an ideal candidate for ischemic evaluation as he would be at high-risk for contrast-induced nephropathy  given his CKD and history of renal transplant but could consider a Myoview for risk stratification. - For now, continue current medical therapy with Jardiance  10 mg daily, Enalapril  20 mg daily (dosing has been deferred to Nephrology and he has not been switched to Entresto), Coreg  6.25 mg twice daily, Hydralazine  25 mg 3 times daily and Imdur  30 mg daily. He has not been on an MRA given his renal function.   2. Essential hypertension - Blood pressure is at 134/80 during today's visit and has been lower at times when checked at home. Continue current medications for now with Amlodipine  10 mg daily, Coreg  6.25 mg twice daily, Enalapril  20 mg daily (deferred to Nephrology), Hydralazine  25 mg 3 times daily and Imdur  30 mg daily. He has been continued on Amlodipine  in the setting of his cardiomyopathy as this was possibly secondary to uncontrolled hypertension.  3. Mixed hyperlipidemia - Followed by PCP. He is on Simvastatin  20 mg daily and would not further titrate given the concurrent use of Amlodipine . If LDL is above goal on repeat labs, would switch to Atorvastatin.  4. History of renal transplant/Stage 3-4 CKD - Previously underwent renal transplant in 2007 due to ESRD in the setting of polycystic kidney disease. Creatinine was at 2.83 when checked in 07/2023 and he is scheduled for follow-up labs within the next few weeks and has follow-up with Dr. Marcelino on 11/30/2023.  Signed, Laymon CHRISTELLA Qua, PA-C

## 2023-11-10 NOTE — Telephone Encounter (Signed)
 Patient came in office, his appt was moved to this afternoon.  He will be evaluated to return to work at that time.

## 2023-11-10 NOTE — Telephone Encounter (Signed)
 Patient no showed his post op, per MD he is able to return to work with no limitations on 11/15/2023.  Return to work form signed by MD and faxed back.

## 2023-11-10 NOTE — Patient Instructions (Signed)
 Surgery to Remove Fluid Build-Up in the Scrotum (Hydrocelectomy) in Adults: What to Know After After your surgery to remove fluid build-up in the scrotum (hydrocelectomy), you may have mild discomfort, swelling, and bruising of the scrotum. Follow these instructions at home: Medicines Take your medicines only as told. You may need to take steps to help treat or prevent trouble pooping (constipation), such as: Taking medicines to help you poop. Eating foods high in fiber, like beans, whole grains, and fresh fruits and vegetables. Drinking more fluids as told. Ask your health care provider if it's safe to drive or use machines while taking your medicine. Bathing Do not take baths, swim, or use a hot tub until you're told it's OK. Ask if you can shower. If you were told to wear an athletic support strap (scrotal support), keep it dry. Take it off when you shower or bathe. Caring for your cuts from surgery  Take care of your cut from surgery as told. Make sure you: Wash your hands with soap and water for at least 20 seconds before and after you change your bandage. If you can't use soap and water, use hand sanitizer. Change your bandage. Leave stitches or skin glue alone. Leave tape strips alone unless you're told to take them off. You may trim the edges of the tape strips if they curl up. Check your cut and scrotum every day for signs of infection. Check for: Redness, pain, or more swelling. Fluid or blood. Warmth. Pus or a bad smell. Managing pain and swelling Use ice or an ice pack as told. Place a towel between your skin and the ice. Leave the ice on for 20 minutes, 2-3 times per day. If your skin turns red, take off the ice right away to prevent skin damage. The risk of damage is higher if you can't feel pain, heat, or cold. Activity If you were given a sedative, do not drive or use machines until you're told it's safe. A sedative can make you sleepy. Ask if it's OK for you to  lift. Ask your provider when it's safe to drive. Ask what things are safe for you to do at home. Ask when you can go back to work or school. General instructions Do not smoke, vape, or use nicotine or tobacco. Doing this can slow down healing. If you were given a scrotal support, wear it as told by your provider. Keep all follow-up visits. If you had a drain put in during the procedure, you'll need to have it removed by your provider. Contact a health care provider if: Your pain is not controlled with medicine. The drain comes out or the color of the fluid in the drain changes. You have any signs of infection. You throw up each time you eat or drink. You have a fever. Get help right away if: You have redness or swelling that starts at your scrotum and spreads outward to your groin. You develop swelling, redness, and pain in your legs. You have trouble breathing. These symptoms may be an emergency. Call 911 right away. Do not wait to see if the symptoms will go away. Do not drive yourself to the hospital. This information is not intended to replace advice given to you by your health care provider. Make sure you discuss any questions you have with your health care provider. Document Revised: 05/17/2023 Document Reviewed: 05/17/2023 Elsevier Patient Education  2025 [employer].

## 2023-11-11 ENCOUNTER — Encounter: Admitting: Urology

## 2023-11-11 ENCOUNTER — Encounter: Payer: Self-pay | Admitting: Student

## 2023-11-11 ENCOUNTER — Ambulatory Visit: Attending: Student | Admitting: Student

## 2023-11-11 ENCOUNTER — Telehealth: Payer: Self-pay | Admitting: Urology

## 2023-11-11 ENCOUNTER — Other Ambulatory Visit (HOSPITAL_BASED_OUTPATIENT_CLINIC_OR_DEPARTMENT_OTHER): Payer: Self-pay

## 2023-11-11 VITALS — BP 134/80 | HR 83 | Ht 64.0 in | Wt 156.6 lb

## 2023-11-11 DIAGNOSIS — I5022 Chronic systolic (congestive) heart failure: Secondary | ICD-10-CM | POA: Diagnosis not present

## 2023-11-11 DIAGNOSIS — I10 Essential (primary) hypertension: Secondary | ICD-10-CM | POA: Diagnosis not present

## 2023-11-11 DIAGNOSIS — Z940 Kidney transplant status: Secondary | ICD-10-CM

## 2023-11-11 DIAGNOSIS — E782 Mixed hyperlipidemia: Secondary | ICD-10-CM | POA: Diagnosis not present

## 2023-11-11 DIAGNOSIS — I5021 Acute systolic (congestive) heart failure: Secondary | ICD-10-CM

## 2023-11-11 MED ORDER — ISOSORBIDE MONONITRATE ER 30 MG PO TB24
30.0000 mg | ORAL_TABLET | Freq: Every day | ORAL | 3 refills | Status: AC
  Filled 2023-11-11 – 2024-01-24 (×3): qty 90, 90d supply, fill #0

## 2023-11-11 NOTE — Telephone Encounter (Signed)
 Patient called into the office today with general questions/concerns regarding he had an appointment yesterday and was told he could go back to work. He would like to confirm exactly when he can return to work. Patient may be reached at [phone] to discuss questions.

## 2023-11-11 NOTE — Telephone Encounter (Signed)
 Return to work note efaxed - Release: [id number]

## 2023-11-11 NOTE — Patient Instructions (Addendum)
 Medication Instructions:   Continue current medication regimen.   *If you need a refill on your cardiac medications before your next appointment, please call your pharmacy*   Testing/Procedures:  Limited Echo in 1-2 months for reassessment of pumping function of your heart.   Follow-Up: At [facility], you and your health needs are our priority.  As part of our continuing mission to provide you with exceptional heart care, our providers are all part of one team.  This team includes your primary Cardiologist (physician) and Advanced Practice Providers or APPs (Physician Assistants and Nurse Practitioners) who all work together to provide you with the care you need, when you need it.  Your next appointment:   3 month(s)  Provider:   You may see Alvan Carrier, MD or one of the following Advanced Practice Providers on your designated Care Team:   Laymon Qua, PA-C  [place], [place] Olivia [place]avy, [place]     We recommend signing up for the patient portal called MyChart.  Sign up information is provided on this After Visit Summary.  MyChart is used to connect with patients for Virtual Visits (Telemedicine).  Patients are able to view lab/test results, encounter notes, upcoming appointments, etc.  Non-urgent messages can be sent to your provider as well.   To learn more about what you can do with MyChart, go to [email or web address].

## 2023-11-11 NOTE — Telephone Encounter (Signed)
 Per Dr. Sherrilee patient may return on 11/29/2023.  Can you request a fax number for employer please

## 2023-12-03 ENCOUNTER — Other Ambulatory Visit (HOSPITAL_BASED_OUTPATIENT_CLINIC_OR_DEPARTMENT_OTHER): Payer: Self-pay

## 2023-12-06 ENCOUNTER — Other Ambulatory Visit: Payer: Self-pay | Admitting: Internal Medicine

## 2023-12-06 DIAGNOSIS — G894 Chronic pain syndrome: Secondary | ICD-10-CM

## 2023-12-06 DIAGNOSIS — M51362 Other intervertebral disc degeneration, lumbar region with discogenic back pain and lower extremity pain: Secondary | ICD-10-CM

## 2023-12-06 MED ORDER — OXYCODONE HCL 5 MG PO TABS: 5.0000 mg | ORAL_TABLET | Freq: Four times a day (QID) | ORAL | 0 refills | Status: DC | PRN

## 2023-12-06 NOTE — Telephone Encounter (Signed)
 Copied from CRM [id number]. Topic: Clinical - Medication Refill >> Dec 06, 2023  8:25 AM Suzen RAMAN wrote: Medication: oxyCODONE  (OXY IR/ROXICODONE ) 5 MG immediate release tablet   Has the patient contacted their pharmacy? Yes (  This is the patient's preferred pharmacy:  [facility] - [place], [place] - [address] [place] [address] Phone: [phone] Fax: [phone]  Is this the correct pharmacy for this prescription? Yes If no, delete pharmacy and type the correct one.   Has the prescription been filled recently? No  Is the patient out of the medication? Yes  Has the patient been seen for an appointment in the last year OR does the patient have an upcoming appointment? Yes  Can we respond through MyChart? No  Agent: Please be advised that Rx refills may take up to 3 business days. We ask that you follow-up with your pharmacy.

## 2023-12-13 ENCOUNTER — Ambulatory Visit (HOSPITAL_COMMUNITY)
Admission: RE | Admit: 2023-12-13 | Discharge: 2023-12-13 | Disposition: A | Discharge status: Home / Self Care | Source: Ambulatory Visit | Attending: Student | Admitting: Student

## 2023-12-13 DIAGNOSIS — I5022 Chronic systolic (congestive) heart failure: Secondary | ICD-10-CM | POA: Diagnosis not present

## 2023-12-13 DIAGNOSIS — I428 Other cardiomyopathies: Secondary | ICD-10-CM

## 2023-12-13 LAB — ECHOCARDIOGRAM LIMITED
Calc EF: 37.7 %
Est EF: 35
S' Lateral: 4.3 cm
Single Plane A2C EF: 36.7 %
Single Plane A4C EF: 37.5 %

## 2023-12-13 NOTE — Progress Notes (Signed)
*  PRELIMINARY RESULTS* Echocardiogram Limited 2-D Echocardiogram has been performed.  [name] 12/13/2023, 8:54 AM

## 2023-12-15 ENCOUNTER — Ambulatory Visit: Payer: Self-pay | Admitting: Student

## 2023-12-15 DIAGNOSIS — I5020 Unspecified systolic (congestive) heart failure: Secondary | ICD-10-CM

## 2023-12-20 ENCOUNTER — Other Ambulatory Visit (HOSPITAL_BASED_OUTPATIENT_CLINIC_OR_DEPARTMENT_OTHER): Payer: Self-pay

## 2023-12-20 ENCOUNTER — Other Ambulatory Visit (HOSPITAL_COMMUNITY): Payer: Self-pay

## 2023-12-21 ENCOUNTER — Other Ambulatory Visit: Payer: Self-pay

## 2023-12-28 NOTE — Telephone Encounter (Signed)
-----   Message from Laymon CHRISTELLA Qua sent at 12/28/2023  3:58 PM EST ----- I heard back from Dr. Alvan and AHF consult was recommended. I called the patient and he was in agreement with this.   Kisha - Please enter a referral to the [facility] for Chronic HFrEF.   Thanks,  Brittany  ----- Message ----- From: Interface, [employer] Sent: 12/13/2023   2:20 PM EST To: Laymon CHRISTELLA Qua, PA-C

## 2023-12-29 ENCOUNTER — Ambulatory Visit (INDEPENDENT_AMBULATORY_CARE_PROVIDER_SITE_OTHER): Admitting: Urology

## 2023-12-29 VITALS — BP 147/76 | HR 83

## 2023-12-29 DIAGNOSIS — N432 Other hydrocele: Secondary | ICD-10-CM

## 2023-12-29 DIAGNOSIS — N433 Hydrocele, unspecified: Secondary | ICD-10-CM

## 2023-12-29 NOTE — Progress Notes (Signed)
 12/29/2023 9:02 AM   Daril Canada 1965/06/08 [id number]  Referring provider: Tobie Suzzane POUR, MD [address] [place],  [place] [address]  Followup after hydrocelectomy   HPI: Mr Valverde is a 58yo here for followup after hydrocelectomy. Incisions healed well. Mild right scrotal swelling   PMH: Past Medical History:  Diagnosis Date   DDD (degenerative disc disease), lumbar    Diabetes mellitus    Hypertension    Polycystic kidney disease     Surgical History: Past Surgical History:  Procedure Laterality Date   HYDROCELE EXCISION Right 10/28/2023   Procedure: HYDROCELECTOMY;  Surgeon: Sherrilee Belvie CROME, MD;  Location: [facility];  Service: Urology;  Laterality: Right;   NEPHRECTOMY TRANSPLANTED ORGAN      Home Medications:  Allergies as of 12/29/2023   No Known Allergies      Medication List        Accurate as of December 29, 2023  9:02 AM. If you have any questions, ask your nurse or doctor.          acetaminophen  500 MG tablet Commonly known as: TYLENOL  Take 500 mg by mouth every 8 (eight) hours as needed for moderate pain (pain score 4-6).   amLODipine  10 MG tablet Commonly known as: NORVASC  Take 1 tablet (10 mg total) by mouth daily.   aspirin  EC 81 MG tablet Take 81 mg by mouth daily.   BD Pen Needle Nano U/F 32G X 4 MM Misc Generic drug: Insulin  Pen Needle 1 each by Does not apply route 4 (four) times daily.   Blood Glucose Monitoring Suppl Devi 1 each by Does not apply route in the morning, at noon, and at bedtime. May substitute to any manufacturer covered by patient's insurance.   glucose blood test strip Use daily to check sugar as instructed.   BLOOD GLUCOSE TEST STRIPS Strp 1 each by In Vitro route in the morning, at noon, and at bedtime. May substitute to any manufacturer covered by patient's insurance.   carvedilol  6.25 MG tablet Commonly known as: COREG  Take 1 tablet (6.25 mg total) by mouth 2 (two) times daily.   doxycycline   100 MG capsule Commonly known as: VIBRAMYCIN  Take 1 capsule (100 mg total) by mouth every 12 (twelve) hours.   enalapril  20 MG tablet Commonly known as: VASOTEC  Take 1 tablet (20 mg total) by mouth daily.   FreeStyle Ewen 3 Reader [employer] Use as Directed   [employer] 3 Sensor Misc Use and directed and change every 14 days   furosemide  20 MG tablet Commonly known as: Lasix  Take 1 tablet (20 mg total) by mouth daily as needed for fluid or edema.   hydrALAZINE  25 MG tablet Commonly known as: APRESOLINE  Take 1 tablet (25 mg total) by mouth 3 (three) times daily.   Tresiba  FlexTouch 100 UNIT/ML FlexTouch Pen Generic drug: insulin  degludec Inject 20 Units into the skin at bedtime.   insulin  degludec 100 UNIT/ML FlexTouch Pen Commonly known as: TRESIBA  Inject 30 Units into the skin at bedtime.   isosorbide  mononitrate 30 MG 24 hr tablet Commonly known as: IMDUR  Take 1 tablet (30 mg total) by mouth daily.   Jardiance  10 MG Tabs tablet Generic drug: empagliflozin  Take 1 tablet (10 mg total) by mouth daily in the morning.   mycophenolate  250 MG capsule Commonly known as: CELLCEPT  Take 1,000 mg by mouth 2 (two) times daily.   oxyCODONE  5 MG immediate release tablet Commonly known as: Oxy IR/ROXICODONE  Take 1 tablet (5 mg total)  by mouth every 6 (six) hours as needed.   predniSONE  5 MG tablet Commonly known as: DELTASONE  Take 5 mg by mouth daily.   simvastatin  20 MG tablet Commonly known as: ZOCOR  Take 1 tablet (20 mg total) by mouth daily.   tacrolimus  1 MG capsule Commonly known as: PROGRAF  Take 2 mg by mouth 2 (two) times daily.   tetrahydrozoline-zinc 0.05-0.25 % ophthalmic solution Commonly known as: VISINE-AC Place 2 drops into both eyes 3 (three) times daily as needed. Dry/Red Eyes        Allergies: No Known Allergies  Family History: Family History  Problem Relation Age of Onset   Diabetes Mother    Kidney disease Mother    Stroke Mother     Kidney disease Father    Hypertension Father    Stroke Father    Heart disease Father     Social History:  reports that he has never smoked. He has never used smokeless tobacco. He reports that he does not drink alcohol and does not use drugs.  ROS: All other review of systems were reviewed and are negative except what is noted above in HPI  Physical Exam: BP (!) 147/76   Pulse 83   Constitutional:  Alert and oriented, No acute distress. HEENT: [place] AT, moist mucus membranes.  Trachea midline, no masses. Cardiovascular: No clubbing, cyanosis, or edema. Respiratory: Normal respiratory effort, no increased work of breathing. GI: Abdomen is soft, nontender, nondistended, no abdominal masses GU: No CVA tenderness.  Lymph: No cervical or inguinal lymphadenopathy. Skin: No rashes, bruises or suspicious lesions. Neurologic: Grossly intact, no focal deficits, moving all 4 extremities. Psychiatric: Normal mood and affect.  Laboratory Data: Lab Results  Component Value Date   WBC 6.6 08/14/2023   HGB 11.0 (L) 08/14/2023   HCT 35.6 (L) 08/14/2023   MCV 86.2 08/14/2023   PLT 184 08/14/2023    Lab Results  Component Value Date   CREATININE 2.83 (H) 08/14/2023    No results found for: PSA  No results found for: TESTOSTERONE  Lab Results  Component Value Date   HGBA1C 7.6 (A) 10/05/2023    Urinalysis    Component Value Date/Time   COLORURINE YELLOW 10/22/2018 0036   APPEARANCEUR Clear 11/10/2023 1345   LABSPEC 1.023 10/22/2018 0036   PHURINE 5.0 10/22/2018 0036   GLUCOSEU 3+ (A) 11/10/2023 1345   HGBUR SMALL (A) 10/22/2018 0036   BILIRUBINUR Negative 11/10/2023 1345   KETONESUR 5 (A) 10/22/2018 0036   PROTEINUR 3+ (A) 11/10/2023 1345   PROTEINUR 100 (A) 10/22/2018 0036   UROBILINOGEN 0.2 11/14/2014 2134   NITRITE Negative 11/10/2023 1345   NITRITE NEGATIVE 10/22/2018 0036   LEUKOCYTESUR Negative 11/10/2023 1345   LEUKOCYTESUR NEGATIVE 10/22/2018 0036    Lab  Results  Component Value Date   LABMICR See below: 11/10/2023   WBCUA 0-5 11/10/2023   LABEPIT 0-10 11/10/2023   BACTERIA None seen 11/10/2023    Pertinent Imaging:  Results for orders placed in visit on 08/15/01  DG Abd 1 View  Narrative FINDINGS CLINICAL DATA:  PNEUMONIA.  CHEST AND ABDOMINAL PAIN. CHEST, TWO VIEWS PA AND LATERAL VIEWS WITH COMPARISON VIEWS OF 08/14/01 REVEALS A POOR INSPIRATORY EFFORT WITH THE HEART SIZE BEING ENLARGED, PARTICULARLY IN THE REGION OF THE LEFT VENTRICLE.  LOSS OF VOLUME IS NOTED IN THE LUNG BASES. IMPRESSION NO CHANGE.  CARDIOMEGALY WITH ATELECTASIS, PARTICULARLY ON THE RIGHT. ABDOMEN, ONE VIEW AN AP VIEW OF THE ABDOMEN REVEALS MILD TO MODERATE GASEOUS DILATATION OF  THE COLON, AS WELL AS PORTIONS OF THE SMALL BOWEL. IMPRESSION ILEUS OF THE COLON.  No results found for this or any previous visit.  No results found for this or any previous visit.  No results found for this or any previous visit.  No results found for this or any previous visit.  No results found for this or any previous visit.  No results found for this or any previous visit.  No results found for this or any previous visit.   Assessment & Plan:    1. Hydrocele, bilateral (Primary) Followup PRN   No follow-ups on file.  Belvie Clara, MD  [facility] [place] [facility] [place]

## 2024-01-03 ENCOUNTER — Other Ambulatory Visit: Payer: Self-pay | Admitting: Internal Medicine

## 2024-01-03 DIAGNOSIS — G894 Chronic pain syndrome: Secondary | ICD-10-CM

## 2024-01-03 DIAGNOSIS — M51362 Other intervertebral disc degeneration, lumbar region with discogenic back pain and lower extremity pain: Secondary | ICD-10-CM

## 2024-01-03 NOTE — Telephone Encounter (Unsigned)
 Copied from CRM #[id number]. Topic: Clinical - Medication Refill >> Jan 03, 2024  4:18 PM Avram MATSU wrote: Medication: oxyCODONE  (OXY IR/ROXICODONE ) 5 MG immediate release tablet [[id number]]  Has the patient contacted their pharmacy? Yes (Agent: If no, request that the patient contact the pharmacy for the refill. If patient does not wish to contact the pharmacy document the reason why and proceed with request.) (Agent: If yes, when and what did the pharmacy advise?)  This is the patient's preferred pharmacy:   [facility] - [place], [place] - [address] [place] [address] Phone: [phone] Fax: [phone]  Is this the correct pharmacy for this prescription? Yes If no, delete pharmacy and type the correct one.   Has the prescription been filled recently? No  Is the patient out of the medication? No  Has the patient been seen for an appointment in the last year OR does the patient have an upcoming appointment? Yes  Can we respond through MyChart? No  Agent: Please be advised that Rx refills may take up to 3 business days. We ask that you follow-up with your pharmacy.

## 2024-01-04 MED ORDER — OXYCODONE HCL 5 MG PO TABS: 5.0000 mg | ORAL_TABLET | Freq: Four times a day (QID) | ORAL | 0 refills | Status: DC | PRN

## 2024-01-11 ENCOUNTER — Other Ambulatory Visit: Payer: Self-pay | Admitting: "Endocrinology

## 2024-01-11 ENCOUNTER — Other Ambulatory Visit (HOSPITAL_BASED_OUTPATIENT_CLINIC_OR_DEPARTMENT_OTHER): Payer: Self-pay

## 2024-01-12 ENCOUNTER — Other Ambulatory Visit (HOSPITAL_BASED_OUTPATIENT_CLINIC_OR_DEPARTMENT_OTHER): Payer: Self-pay

## 2024-01-12 MED ORDER — INSULIN PEN NEEDLE 32G X 4 MM MISC
1.0000 | Freq: Four times a day (QID) | 1 refills | Status: AC
  Filled 2024-01-12: qty 100, 25d supply, fill #0

## 2024-01-21 ENCOUNTER — Ambulatory Visit: Admitting: Internal Medicine

## 2024-01-21 ENCOUNTER — Encounter: Payer: Self-pay | Admitting: Internal Medicine

## 2024-01-21 VITALS — BP 144/86 | HR 88 | Ht 61.0 in | Wt 165.0 lb

## 2024-01-21 DIAGNOSIS — Z940 Kidney transplant status: Secondary | ICD-10-CM

## 2024-01-21 DIAGNOSIS — Z794 Long term (current) use of insulin: Secondary | ICD-10-CM

## 2024-01-21 DIAGNOSIS — G894 Chronic pain syndrome: Secondary | ICD-10-CM

## 2024-01-21 DIAGNOSIS — E1169 Type 2 diabetes mellitus with other specified complication: Secondary | ICD-10-CM | POA: Diagnosis not present

## 2024-01-21 DIAGNOSIS — Z0001 Encounter for general adult medical examination with abnormal findings: Secondary | ICD-10-CM

## 2024-01-21 DIAGNOSIS — E782 Mixed hyperlipidemia: Secondary | ICD-10-CM | POA: Diagnosis not present

## 2024-01-21 DIAGNOSIS — N186 End stage renal disease: Secondary | ICD-10-CM | POA: Diagnosis not present

## 2024-01-21 DIAGNOSIS — I10 Essential (primary) hypertension: Secondary | ICD-10-CM | POA: Diagnosis not present

## 2024-01-21 DIAGNOSIS — Z125 Encounter for screening for malignant neoplasm of prostate: Secondary | ICD-10-CM

## 2024-01-21 NOTE — Patient Instructions (Addendum)
 Please check [facility] or Clinical Cytogeneticist or [facility] in [place] for eye exam.  Please continue to take medications as prescribed.  Please continue to follow low carb diet and ambulate as tolerated.

## 2024-01-21 NOTE — Assessment & Plan Note (Addendum)
 Lab Results  Component Value Date   HGBA1C 7.6 (A) 10/05/2023   Uncontrolled, but improving Associated with HTN, HLD, CKD On Tresiba  20 units nightly and Jardiance  10 mg QD, followed by endocrinology Advised to take Tresiba  24 U  qHS for now Advised to follow diabetic diet On statin and ACEi, needs to be compliant to statin F/u CMP and lipid panel Diabetic eye exam: Advised to follow up with Ophthalmology for diabetic eye exam

## 2024-01-21 NOTE — Progress Notes (Signed)
 Established Patient Office Visit  Subjective:  Patient ID: [name], male    DOB: July 29, 1965  Age: 58 y.o. MRN: [id number]  CC:  Chief Complaint  Patient presents with   Hypertension    Follow up     HPI [name] is a 58 y.o. male with past medical history of ESRD s/p renal transplant (2007), HTN, type II DM, HLD and lumbar DDD who presents for f/u of his chronic medical conditions.  HTN: His BP was elevated today, as he has not taken his medicines.  He is supposed to take amlodipine  10 mg QD, Coreg  6.25 mg BID, Hydralazine  25 mg TID and Imdur  30 mg QD. His enalapril  20 mg once daily has been discontinued from recent hospitalization due to AKI, followed by nephrology.  Denies any headache, dizziness, chest pain, dyspnea or palpitations.  Type II DM: His HbA1c was 8.7 in 05/25.  He takes Tresiba  20 units nightly, followed by endocrinology. His dose of Tresiba  was increased to 30 units in the last visit, but has been still taking 20 units due to fear of hypoglycemia.  He is on Jardiance  10 mg QD for proteinuria.  Denies polyuria or polyphagia currently.  ESRD s/p renal transplant: He has history of polycystic kidney disease, s/p nephrectomy.  He had renal transplant in 2007, currently followed by nephrology - Dr. Marcelino and transplant team at [facility].  He is on tacrolimus , mycophenolate  and oral prednisone .   Chronic pain syndrome: He has history of lumbar DDD.  He has completed PT, denies any history of spine surgery.  He has been on oxycodone  5 mg QID PRN for about 10 years. Denies alcohol abuse or any other illicit drug use currently.      Past Medical History:  Diagnosis Date   DDD (degenerative disc disease), lumbar    Diabetes mellitus    Hypertension    Polycystic kidney disease     Past Surgical History:  Procedure Laterality Date   HYDROCELE EXCISION Right 10/28/2023   Procedure: HYDROCELECTOMY;  Surgeon: Sherrilee Belvie CROME, MD;  Location: [facility];   Service: Urology;  Laterality: Right;   NEPHRECTOMY TRANSPLANTED ORGAN      Family History  Problem Relation Age of Onset   Diabetes Mother    Kidney disease Mother    Stroke Mother    Kidney disease Father    Hypertension Father    Stroke Father    Heart disease Father     Social History   Socioeconomic History   Marital status: Married    Spouse name: Not on file   Number of children: Not on file   Years of education: Not on file   Highest education level: Not on file  Occupational History   Not on file  Tobacco Use   Smoking status: Never   Smokeless tobacco: Never  Vaping Use   Vaping status: Never Used  Substance and Sexual Activity   Alcohol use: No    Comment: rarely   Drug use: No   Sexual activity: Not Currently  Other Topics Concern   Not on file  Social History Narrative   Not on file   Social Drivers of Health   Financial Resource Strain: Not on file  Food Insecurity: No Food Insecurity (08/16/2023)   Hunger Vital Sign    Worried About Running Out of Food in the Last Year: Never true    Ran Out of Food in the Last Year: Never true  Transportation Needs: No  Transportation Needs (08/16/2023)   PRAPARE - Administrator, Civil Service (Medical): No    Lack of Transportation (Non-Medical): No  Physical Activity: Not on file  Stress: Not on file  Social Connections: Not on file  Intimate Partner Violence: Not At Risk (08/16/2023)   Humiliation, Afraid, Rape, and Kick questionnaire    Fear of Current or Ex-Partner: No    Emotionally Abused: No    Physically Abused: No    Sexually Abused: No    Outpatient Medications Prior to Visit  Medication Sig Dispense Refill   acetaminophen  (TYLENOL ) 500 MG tablet Take 500 mg by mouth every 8 (eight) hours as needed for moderate pain (pain score 4-6).     amLODipine  (NORVASC ) 10 MG tablet Take 1 tablet (10 mg total) by mouth daily. 90 tablet 3   aspirin  EC 81 MG tablet Take 81 mg by mouth daily.      Blood Glucose Monitoring Suppl DEVI 1 each by Does not apply route in the morning, at noon, and at bedtime. May substitute to any manufacturer covered by patient's insurance. 1 each 0   carvedilol  (COREG ) 6.25 MG tablet Take 1 tablet (6.25 mg total) by mouth 2 (two) times daily. 180 tablet 3   empagliflozin  (JARDIANCE ) 10 MG TABS tablet Take 1 tablet (10 mg total) by mouth daily in the morning. 30 tablet 12   enalapril  (VASOTEC ) 20 MG tablet Take 1 tablet (20 mg total) by mouth daily. 90 tablet 3   furosemide  (LASIX ) 20 MG tablet Take 1 tablet (20 mg total) by mouth daily as needed for fluid or edema. 30 tablet 1   Glucose Blood (BLOOD GLUCOSE TEST STRIPS) STRP 1 each by In Vitro route in the morning, at noon, and at bedtime. May substitute to any manufacturer covered by patient's insurance. 100 strip 3   glucose blood test strip Use daily to check sugar as instructed. 100 each 2   hydrALAZINE  (APRESOLINE ) 25 MG tablet Take 1 tablet (25 mg total) by mouth 3 (three) times daily. 90 tablet 2   insulin  degludec (TRESIBA ) 100 UNIT/ML FlexTouch Pen Inject 30 Units into the skin at bedtime. 15 mL 1   insulin  degludec (TRESIBA ) 100 UNIT/ML FlexTouch Pen Inject 20 Units into the skin at bedtime. 15 mL 1   Insulin  Pen Needle 32G X 4 MM MISC Use 4 (four) times daily. 100 each 1   isosorbide  mononitrate (IMDUR ) 30 MG 24 hr tablet Take 1 tablet (30 mg total) by mouth daily. 90 tablet 3   mycophenolate  (CELLCEPT ) 250 MG capsule Take 1,000 mg by mouth 2 (two) times daily.     oxyCODONE  (OXY IR/ROXICODONE ) 5 MG immediate release tablet Take 1 tablet (5 mg total) by mouth every 6 (six) hours as needed. 120 tablet 0   predniSONE  (DELTASONE ) 5 MG tablet Take 5 mg by mouth daily.     tacrolimus  (PROGRAF ) 1 MG capsule Take 2 mg by mouth 2 (two) times daily.     tetrahydrozoline-zinc (VISINE-AC) 0.05-0.25 % ophthalmic solution Place 2 drops into both eyes 3 (three) times daily as needed. Dry/Red Eyes     doxycycline   (VIBRAMYCIN ) 100 MG capsule Take 1 capsule (100 mg total) by mouth every 12 (twelve) hours. 14 capsule 0   simvastatin  (ZOCOR ) 20 MG tablet Take 1 tablet (20 mg total) by mouth daily. 90 tablet 2   Continuous Glucose Receiver (FREESTYLE LIBRE 3 READER) DEVI Use as Directed (Patient not taking: Reported on 11/11/2023) 1 each 0  Continuous Glucose Sensor (FREESTYLE LIBRE 3 SENSOR) MISC Use and directed and change every 14 days (Patient not taking: Reported on 01/21/2024) 2 each 2   No facility-administered medications prior to visit.    No Known Allergies  ROS Review of Systems  Constitutional:  Negative for chills and fever.  HENT:  Negative for congestion and sore throat.   Eyes:  Negative for pain and discharge.  Respiratory:  Negative for cough and shortness of breath.   Cardiovascular:  Negative for chest pain and palpitations.  Gastrointestinal:  Negative for diarrhea, nausea and vomiting.  Endocrine: Negative for polydipsia and polyuria.  Genitourinary:  Negative for dysuria and hematuria.  Musculoskeletal:  Positive for back pain. Negative for neck pain and neck stiffness.  Skin:  Negative for rash.  Neurological:  Negative for dizziness and weakness.  Psychiatric/Behavioral:  Negative for agitation and behavioral problems.       Objective:    Physical Exam Vitals reviewed.  Constitutional:      General: He is not in acute distress.    Appearance: He is not diaphoretic.  HENT:     Head: Normocephalic and atraumatic.     Nose: Nose normal.     Mouth/Throat:     Mouth: Mucous membranes are moist.  Eyes:     General: No scleral icterus.    Extraocular Movements: Extraocular movements intact.  Cardiovascular:     Rate and Rhythm: Normal rate and regular rhythm.     Heart sounds: Normal heart sounds. No murmur heard. Pulmonary:     Breath sounds: No wheezing or rales.  Abdominal:     Palpations: Abdomen is soft.     Tenderness: There is no abdominal tenderness.   Musculoskeletal:     Cervical back: Neck supple. No tenderness.     Right lower leg: No edema.     Left lower leg: No edema.  Skin:    General: Skin is warm.     Findings: No rash.  Neurological:     General: No focal deficit present.     Mental Status: He is alert and oriented to person, place, and time.  Psychiatric:        Mood and Affect: Mood normal.        Behavior: Behavior normal.     BP (!) 144/86 (BP Location: Right Arm)   Pulse 88   Ht 5' 1 (1.549 m)   Wt 165 lb (74.8 kg)   SpO2 96%   BMI 31.18 kg/m  Wt Readings from Last 3 Encounters:  01/21/24 165 lb (74.8 kg)  11/11/23 156 lb 9.6 oz (71 kg)  10/28/23 160 lb 0.9 oz (72.6 kg)    Lab Results  Component Value Date   TSH 1.140 01/21/2024   Lab Results  Component Value Date   WBC 9.8 01/21/2024   HGB 11.7 (L) 01/21/2024   HCT 39.1 01/21/2024   MCV 85 01/21/2024   PLT 274 01/21/2024   Lab Results  Component Value Date   NA 141 01/21/2024   K 4.5 01/21/2024   CO2 17 (L) 01/21/2024   GLUCOSE 118 (H) 01/21/2024   BUN 40 (H) 01/21/2024   CREATININE 2.84 (H) 01/21/2024   BILITOT 0.3 01/21/2024   ALKPHOS 106 01/21/2024   AST 18 01/21/2024   ALT 11 01/21/2024   PROT 5.9 (L) 01/21/2024   ALBUMIN 3.4 (L) 01/21/2024   CALCIUM  8.9 01/21/2024   ANIONGAP 9 08/14/2023   EGFR 25 (L) 01/21/2024   Lab Results  Component Value Date   CHOL 231 (H) 01/21/2024   Lab Results  Component Value Date   HDL 40 01/21/2024   Lab Results  Component Value Date   LDLCALC 167 (H) 01/21/2024   Lab Results  Component Value Date   TRIG 132 01/21/2024   Lab Results  Component Value Date   CHOLHDL 5.8 (H) 01/21/2024   Lab Results  Component Value Date   HGBA1C 7.3 (H) 01/21/2024      Assessment & Plan:   Problem List Items Addressed This Visit       Cardiovascular and Mediastinum   Essential hypertension   BP Readings from Last 1 Encounters:  01/21/24 (!) 144/86   Elevated today as he has not had his  medications Usually well-controlled with amlodipine  10 mg QD, Coreg  6.25 mg BID Has been placed on hydralazine  25 mg 3 times daily and Imdur  30 mg QD DCed enalapril  20 mg QD recently from hospitalization, and advised to contact nephrology before restarting enalapril  Counseled for compliance with the medications Advised DASH diet and moderate exercise/walking, at least 150 mins/week      Relevant Orders   TSH (Completed)   CMP14+EGFR (Completed)   CBC with Differential/Platelet (Completed)     Endocrine   Type 2 diabetes mellitus with other specified complication (HCC)   Lab Results  Component Value Date   HGBA1C 7.6 (A) 10/05/2023   Uncontrolled, but improving Associated with HTN, HLD, CKD On Tresiba  20 units nightly and Jardiance  10 mg QD, followed by endocrinology Advised to take Tresiba  24 U  qHS for now Advised to follow diabetic diet On statin and ACEi, needs to be compliant to statin F/u CMP and lipid panel Diabetic eye exam: Advised to follow up with Ophthalmology for diabetic eye exam      Relevant Orders   Hemoglobin A1c (Completed)   CMP14+EGFR (Completed)   Ambulatory referral to Optometry     Genitourinary   ESRD (end stage renal disease) (HCC)   Due to polycystic kidney disease and type II DM, s/p renal transplant in 2007 On Jardiance  for proteinuria Recently DCed Enalapril  Followed by nephrology      Relevant Orders   VITAMIN D  25 Hydroxy (Vit-D Deficiency, Fractures) (Completed)     Other   History of renal transplant   On tacrolimus , CellCept  and oral prednisone  Followed by  transplant team at [facility]      Mixed hyperlipidemia   On simvastatin  20 mg QD, compliance questionable Lipid profile shows elevated LDL Switched to Atorvastatin  40 mg QD      Relevant Orders   Lipid panel (Completed)   Chronic pain syndrome   Due to lumbar DDD Unable to take oral NSAIDs due to ESRD On oxycodone  5 mg 4 times daily as needed - will manage chronic  pain medicine for now, if he has uncontrolled pain, will refer to pain management again      Encounter for general adult medical examination with abnormal findings - Primary   Physical exam as documented. Fasting blood tests today. Prefers to get Shingrix and Tdap vaccines later.        No orders of the defined types were placed in this encounter.   Follow-up: Return in about 4 months (around 05/21/2024).    Suzzane MARLA Blanch, MD

## 2024-01-21 NOTE — Assessment & Plan Note (Signed)
 Due to lumbar DDD Unable to take oral NSAIDs due to ESRD On oxycodone  5 mg 4 times daily as needed - will manage chronic pain medicine for now, if he has uncontrolled pain, will refer to pain management again

## 2024-01-21 NOTE — Assessment & Plan Note (Addendum)
 BP Readings from Last 1 Encounters:  01/21/24 (!) 144/86   Elevated today as he has not had his medications Usually well-controlled with amlodipine  10 mg QD, Coreg  6.25 mg BID Has been placed on hydralazine  25 mg 3 times daily and Imdur  30 mg QD DCed enalapril  20 mg QD recently from hospitalization, and advised to contact nephrology before restarting enalapril  Counseled for compliance with the medications Advised DASH diet and moderate exercise/walking, at least 150 mins/week

## 2024-01-21 NOTE — Assessment & Plan Note (Signed)
 Due to polycystic kidney disease and type II DM, s/p renal transplant in 2007 On Jardiance  for proteinuria Recently DCed Enalapril  Followed by nephrology

## 2024-01-22 ENCOUNTER — Encounter: Payer: Self-pay | Admitting: Internal Medicine

## 2024-01-22 ENCOUNTER — Other Ambulatory Visit: Payer: Self-pay | Admitting: Internal Medicine

## 2024-01-22 ENCOUNTER — Ambulatory Visit: Payer: Self-pay | Admitting: Internal Medicine

## 2024-01-22 DIAGNOSIS — Z0001 Encounter for general adult medical examination with abnormal findings: Secondary | ICD-10-CM | POA: Insufficient documentation

## 2024-01-22 DIAGNOSIS — E559 Vitamin D deficiency, unspecified: Secondary | ICD-10-CM

## 2024-01-22 DIAGNOSIS — E782 Mixed hyperlipidemia: Secondary | ICD-10-CM

## 2024-01-22 LAB — CMP14+EGFR
ALT: 11 IU/L (ref 0–44)
AST: 18 IU/L (ref 0–40)
Albumin: 3.4 g/dL — ABNORMAL LOW (ref 3.8–4.9)
Alkaline Phosphatase: 106 IU/L (ref 47–123)
BUN/Creatinine Ratio: 14 (ref 9–20)
BUN: 40 mg/dL — ABNORMAL HIGH (ref 6–24)
Bilirubin Total: 0.3 mg/dL (ref 0.0–1.2)
CO2: 17 mmol/L — ABNORMAL LOW (ref 20–29)
Calcium: 8.9 mg/dL (ref 8.7–10.2)
Chloride: 109 mmol/L — ABNORMAL HIGH (ref 96–106)
Creatinine, Ser: 2.84 mg/dL — ABNORMAL HIGH (ref 0.76–1.27)
Globulin, Total: 2.5 g/dL (ref 1.5–4.5)
Glucose: 118 mg/dL — ABNORMAL HIGH (ref 70–99)
Potassium: 4.5 mmol/L (ref 3.5–5.2)
Sodium: 141 mmol/L (ref 134–144)
Total Protein: 5.9 g/dL — ABNORMAL LOW (ref 6.0–8.5)
eGFR: 25 mL/min/1.73 — ABNORMAL LOW (ref >59)

## 2024-01-22 LAB — CBC WITH DIFFERENTIAL/PLATELET
Basophils Absolute: 0 x10E3/uL (ref 0.0–0.2)
Basos: 0 %
EOS (ABSOLUTE): 0.2 x10E3/uL (ref 0.0–0.4)
Eos: 2 %
Hematocrit: 39.1 % (ref 37.5–51.0)
Hemoglobin: 11.7 g/dL — ABNORMAL LOW (ref 13.0–17.7)
Immature Grans (Abs): 0 x10E3/uL (ref 0.0–0.1)
Immature Granulocytes: 0 %
Lymphocytes Absolute: 1.4 x10E3/uL (ref 0.7–3.1)
Lymphs: 15 %
MCH: 25.4 pg — ABNORMAL LOW (ref 26.6–33.0)
MCHC: 29.9 g/dL — ABNORMAL LOW (ref 31.5–35.7)
MCV: 85 fL (ref 79–97)
Monocytes Absolute: 0.5 x10E3/uL (ref 0.1–0.9)
Monocytes: 5 %
Neutrophils Absolute: 7.7 x10E3/uL — ABNORMAL HIGH (ref 1.4–7.0)
Neutrophils: 78 %
Platelets: 274 x10E3/uL (ref 150–450)
RBC: 4.61 x10E6/uL (ref 4.14–5.80)
RDW: 14.1 % (ref 11.6–15.4)
WBC: 9.8 x10E3/uL (ref 3.4–10.8)

## 2024-01-22 LAB — HEMOGLOBIN A1C
Est. average glucose Bld gHb Est-mCnc: 163 mg/dL
Hgb A1c MFr Bld: 7.3 % — ABNORMAL HIGH (ref 4.8–5.6)

## 2024-01-22 LAB — LIPID PANEL
Chol/HDL Ratio: 5.8 ratio — ABNORMAL HIGH (ref 0.0–5.0)
Cholesterol, Total: 231 mg/dL — ABNORMAL HIGH (ref 100–199)
HDL: 40 mg/dL (ref >39)
LDL Chol Calc (NIH): 167 mg/dL — ABNORMAL HIGH (ref 0–99)
Triglycerides: 132 mg/dL (ref 0–149)
VLDL Cholesterol Cal: 24 mg/dL (ref 5–40)

## 2024-01-22 LAB — VITAMIN D 25 HYDROXY (VIT D DEFICIENCY, FRACTURES): Vit D, 25-Hydroxy: 8.3 ng/mL — ABNORMAL LOW (ref 30.0–100.0)

## 2024-01-22 LAB — TSH: TSH: 1.14 u[IU]/mL (ref 0.450–4.500)

## 2024-01-22 MED ORDER — ATORVASTATIN CALCIUM 40 MG PO TABS
40.0000 mg | ORAL_TABLET | Freq: Every day | ORAL | 3 refills | Status: AC
  Filled 2024-01-22: qty 90, 90d supply, fill #0

## 2024-01-22 MED ORDER — VITAMIN D (ERGOCALCIFEROL) 1.25 MG (50000 UNIT) PO CAPS
50000.0000 [IU] | ORAL_CAPSULE | ORAL | 1 refills | Status: AC
  Filled 2024-01-22: qty 12, 84d supply, fill #0

## 2024-01-22 NOTE — Assessment & Plan Note (Signed)
 Physical exam as documented. Fasting blood tests today. Prefers to get Shingrix and Tdap vaccines later.

## 2024-01-22 NOTE — Assessment & Plan Note (Signed)
 On tacrolimus , CellCept  and oral prednisone  Followed by  transplant team at [facility]

## 2024-01-22 NOTE — Addendum Note (Signed)
 Addended byBETHA TOBIE DOWNS on: 01/22/2024 01:20 PM   Modules accepted: Orders

## 2024-01-22 NOTE — Assessment & Plan Note (Signed)
 On simvastatin  20 mg QD, compliance questionable Lipid profile shows elevated LDL Switched to Atorvastatin  40 mg QD

## 2024-01-24 ENCOUNTER — Other Ambulatory Visit (HOSPITAL_BASED_OUTPATIENT_CLINIC_OR_DEPARTMENT_OTHER): Payer: Self-pay

## 2024-02-02 ENCOUNTER — Other Ambulatory Visit (HOSPITAL_BASED_OUTPATIENT_CLINIC_OR_DEPARTMENT_OTHER): Payer: Self-pay

## 2024-02-02 ENCOUNTER — Other Ambulatory Visit: Payer: Self-pay | Admitting: Internal Medicine

## 2024-02-02 DIAGNOSIS — E1169 Type 2 diabetes mellitus with other specified complication: Secondary | ICD-10-CM

## 2024-02-02 MED ORDER — TRESIBA FLEXTOUCH 100 UNIT/ML ~~LOC~~ SOPN
24.0000 [IU] | PEN_INJECTOR | Freq: Every day | SUBCUTANEOUS | 1 refills | Status: AC
  Filled 2024-02-02: qty 15, 62d supply, fill #0

## 2024-02-04 ENCOUNTER — Other Ambulatory Visit: Payer: Self-pay | Admitting: Internal Medicine

## 2024-02-04 DIAGNOSIS — G894 Chronic pain syndrome: Secondary | ICD-10-CM

## 2024-02-04 DIAGNOSIS — M51362 Other intervertebral disc degeneration, lumbar region with discogenic back pain and lower extremity pain: Secondary | ICD-10-CM

## 2024-02-04 NOTE — Telephone Encounter (Signed)
 Copied from CRM #[id number]. Topic: Clinical - Medication Refill >> Feb 04, 2024  2:07 PM Wess RAMAN wrote: Medication: oxyCODONE  (OXY IR/ROXICODONE ) 5 MG immediate release tablet   Has the patient contacted their pharmacy? No (Agent: If no, request that the patient contact the pharmacy for the refill. If patient does not wish to contact the pharmacy document the reason why and proceed with request.) (Agent: If yes, when and what did the pharmacy advise?)  This is the patient's preferred pharmacy:   [facility] - [place], [place] - [address] [place] [address] Phone: [phone] Fax: [phone]  Is this the correct pharmacy for this prescription? Yes If no, delete pharmacy and type the correct one.   Has the prescription been filled recently? Yes  Is the patient out of the medication? No  Has the patient been seen for an appointment in the last year OR does the patient have an upcoming appointment? Yes  Can we respond through MyChart? Yes  Agent: Please be advised that Rx refills may take up to 3 business days. We ask that you follow-up with your pharmacy.

## 2024-02-07 MED ORDER — OXYCODONE HCL 5 MG PO TABS: 5.0000 mg | ORAL_TABLET | Freq: Four times a day (QID) | ORAL | 0 refills | Status: DC | PRN

## 2024-02-14 ENCOUNTER — Other Ambulatory Visit (HOSPITAL_BASED_OUTPATIENT_CLINIC_OR_DEPARTMENT_OTHER): Payer: Self-pay

## 2024-02-14 ENCOUNTER — Encounter: Payer: Self-pay | Admitting: Nutrition

## 2024-02-14 ENCOUNTER — Encounter: Admitting: Nutrition

## 2024-02-14 ENCOUNTER — Telehealth: Payer: Self-pay

## 2024-02-14 ENCOUNTER — Other Ambulatory Visit (HOSPITAL_COMMUNITY): Payer: Self-pay

## 2024-02-14 ENCOUNTER — Ambulatory Visit: Admitting: "Endocrinology

## 2024-02-14 VITALS — Ht 64.0 in | Wt 166.0 lb

## 2024-02-14 DIAGNOSIS — E1169 Type 2 diabetes mellitus with other specified complication: Secondary | ICD-10-CM | POA: Diagnosis not present

## 2024-02-14 DIAGNOSIS — Z794 Long term (current) use of insulin: Secondary | ICD-10-CM | POA: Insufficient documentation

## 2024-02-14 NOTE — Telephone Encounter (Signed)
 Pharmacy Patient Advocate Encounter   Received notification from Pt Calls Messages that prior authorization for Freestyle libre 3 plus sensor is required/requested.   Insurance verification completed.   The patient is insured through [facility].   Per test claim: PA required; PA started via CoverMyMeds. KEY [id number] . Please see clinical question(s) below that I am not finding the answer to in their chart and advise.     I only see that he is taking Tresiba  once daily. PA was previously denied earlier this year for the same reason. If insulin  usage has not changed, PA will be denied again

## 2024-02-14 NOTE — Progress Notes (Signed)
 Medical Nutrition Therapy  Appointment Start time:  [phone]     Appointment End time:  0945 Primary concerns today: Dm Type 2, Kidney Transplant Referral diagnosis: E11.69 Preferred learning style: Visual and hands on (auditory, visual, hands on, no preference indicated) Learning readiness: Ready  NUTRITION ASSESSMENT  A1C 7.3% Tresiba  20 units, suppose to be taking 30 units at night. I dont' take that much because it makes my sugar drop too low. Missed his appt with Dr.Nida this am because he was late for his appointment. Rescheduled appointment with Dr. Lenis office. Hasn't made appt with transplant team yet. [facility] Transplant  Team while in the office and scheduled his appt for this Wednesday at 8 am. He verbalized the appt time and date. Hasn't gotten the LIBRE. Pharmacy is saying it is waiting on a pre authorization. He notes he has to go pick up his iTresiba and Jardiance  from pharmacy. Meter shows he has had a few high BS in the am and he reports that was because he was out of insulin . FBS this was 177 mg/dl. Not checking blood sugars daily as instructed. Waiting on CGM.    Wt Readings from Last 3 Encounters:  02/14/24 166 lb (75.3 kg)  01/21/24 165 lb (74.8 kg)  11/11/23 156 lb 9.6 oz (71 kg)   Ht Readings from Last 3 Encounters:  02/14/24 5' 4 (1.626 m)  01/21/24 5' 1 (1.549 m)  11/11/23 5' 4 (1.626 m)   Body mass index is 28.49 kg/m. @BMIFA @ Facility age limit for growth %iles is 20 years. Facility age limit for growth %iles is 20 years.    Lab Results  Component Value Date   HGBA1C 7.3 (H) 01/21/2024      Latest Ref Rng & Units 01/21/2024    8:43 AM 08/14/2023    3:38 AM 08/13/2023    4:46 AM  CMP  Glucose 70 - 99 mg/dL 881  823  824   BUN 6 - 24 mg/dL 40  52  39   Creatinine 0.76 - 1.27 mg/dL 7.15  7.16  7.38   Sodium 134 - 144 mmol/L 141  139  138   Potassium 3.5 - 5.2 mmol/L 4.5  4.2  4.6   Chloride 96 - 106 mmol/L 109  109  109   CO2 20 - 29  mmol/L 17  21  19    Calcium  8.7 - 10.2 mg/dL 8.9  8.6  8.9   Total Protein 6.0 - 8.5 g/dL 5.9     Total Bilirubin 0.0 - 1.2 mg/dL 0.3     Alkaline Phos 47 - 123 IU/L 106     AST 0 - 40 IU/L 18     ALT 0 - 44 IU/L 11      Lipid Panel     Component Value Date/Time   CHOL 231 (H) 01/21/2024 0843   TRIG 132 01/21/2024 0843   HDL 40 01/21/2024 0843   CHOLHDL 5.8 (H) 01/21/2024 0843   LDLCALC 167 (H) 01/21/2024 0843   LABVLDL 24 01/21/2024 0843    Notable Signs/Symptoms:  Lifestyle & Dietary Hx Married and lives with his wife. 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  Estimated daily fluid intake: 40 oz Supplements:  Sleep: poor Stress / self-care: his health Current average weekly physical activity: ADL  24-Hr Dietary Recall Skipped breakfast L) Green beans, pintos and cabbage,  [place] due to blood sugar dropping-- usually water D) Chicken, shrimp, mashed potatoes, water FBS--110's  Needs to check BSs at night.  Taking  20 units but suppose to be taking 30 units per Dr. Barbette orders.  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  Estimated Energy Needs Calories: 1800 Carbohydrate: 200g Protein: 135g Fat: 50g   NUTRITION DIAGNOSIS  NB-1.1 Food and nutrition-related knowledge deficit As related to Diabetes Type 2 and CKD.  As evidenced by A1C 8.4 and.eGFR 33   NUTRITION INTERVENTION  Nutrition education (E-1) on the following topics:  Nutrition and Diabetes education provided on My Plate, CHO counting, meal planning, portion sizes, timing of meals, avoiding snacks between meals unless having a low blood sugar, target ranges for A1C and blood sugars, signs/symptoms and treatment of hyper/hypoglycemia, monitoring blood sugars, taking medications as prescribed, benefits of exercising 30 minutes per day and prevention of complications of DM.  Importance of compliance with medications and checking blood sugars.  Chronic kidney  disease  Handouts Provided Include  Lifestyle Medicine Blood sugars log  Learning Style & Readiness for Change Teaching method utilized: Visual & Auditory  Demonstrated degree of understanding via: Teach Back  Barriers to learning/adherence to lifestyle change: commitment to being compliant to taking medications and diet restrictions  Goals Established by Pt Start taking insulin  30 units daily Get CGM-will request nurse check on the preauthorization informaiton. Eat breakfast daily. Keep appointment with transplant team for Wednesday.  MONITORING & EVALUATION Dietary intake, weekly physical activity, and blood sguars in 3 month.  Next Steps  Patient is to start taking medication as prescribed.SABRA

## 2024-02-14 NOTE — Patient Instructions (Signed)
 Goals Established by Pt Start taking insulin  30 units daily Get CGM-will request nurse check on the preauthorization informaiton. Eat breakfast daily. Keep appointment with transplant team for Wednesday.

## 2024-02-14 NOTE — Telephone Encounter (Signed)
 Patient came into the clinic needing a prior authorization done for [facility] 3+. Please advise.

## 2024-02-15 ENCOUNTER — Other Ambulatory Visit (HOSPITAL_BASED_OUTPATIENT_CLINIC_OR_DEPARTMENT_OTHER): Payer: Self-pay

## 2024-02-16 ENCOUNTER — Other Ambulatory Visit (HOSPITAL_BASED_OUTPATIENT_CLINIC_OR_DEPARTMENT_OTHER): Payer: Self-pay

## 2024-02-16 ENCOUNTER — Telehealth: Payer: Self-pay

## 2024-02-16 ENCOUNTER — Telehealth (HOSPITAL_BASED_OUTPATIENT_CLINIC_OR_DEPARTMENT_OTHER): Payer: Self-pay

## 2024-02-16 ENCOUNTER — Other Ambulatory Visit (HOSPITAL_COMMUNITY): Payer: Self-pay

## 2024-02-16 ENCOUNTER — Other Ambulatory Visit: Payer: Self-pay | Admitting: "Endocrinology

## 2024-02-16 MED ORDER — DEXCOM G7 SENSOR MISC
2 refills | Status: AC
  Filled 2024-02-16: qty 3, 30d supply, fill #0

## 2024-02-16 MED ORDER — ACCU-CHEK GUIDE TEST VI STRP
ORAL_STRIP | 12 refills | Status: AC
  Filled 2024-02-16 – 2024-03-17 (×2): qty 100, 50d supply, fill #0

## 2024-02-16 MED ORDER — ACCU-CHEK GUIDE W/DEVICE KIT
1.0000 | PACK | 0 refills | Status: AC
  Filled 2024-02-16: qty 1, 30d supply, fill #0

## 2024-02-16 MED ORDER — DEXCOM G7 RECEIVER DEVI
0 refills | Status: AC
  Filled 2024-02-16: qty 1, 30d supply, fill #0

## 2024-02-16 NOTE — Telephone Encounter (Signed)
 Medication: Dexcom G7 Receiver Able to fill? No Prior authorization required? Yes Co-pay before assistance: N/A

## 2024-02-16 NOTE — Telephone Encounter (Signed)
 Called and spoke with patient that his insurance will not cover his current CGM and will have to do his blood glucose with the fingerstick.

## 2024-02-16 NOTE — Telephone Encounter (Signed)
 Pharmacy Patient Advocate Encounter   Received notification from Pt Calls Messages that prior authorization for Dexcom G7 sensor is required/requested.   The same criteria required for [facility] sensors is also required for Dexcom. Patient does not currently meet these requirements

## 2024-02-16 NOTE — Telephone Encounter (Signed)
 Medication: Dexcom G7 Sensor Able to fill? No Prior authorization required? Yes Co-pay before assistance: N/A

## 2024-02-18 ENCOUNTER — Other Ambulatory Visit (HOSPITAL_BASED_OUTPATIENT_CLINIC_OR_DEPARTMENT_OTHER): Payer: Self-pay

## 2024-02-21 ENCOUNTER — Other Ambulatory Visit (HOSPITAL_BASED_OUTPATIENT_CLINIC_OR_DEPARTMENT_OTHER): Payer: Self-pay

## 2024-02-22 ENCOUNTER — Other Ambulatory Visit (HOSPITAL_BASED_OUTPATIENT_CLINIC_OR_DEPARTMENT_OTHER): Payer: Self-pay

## 2024-02-23 ENCOUNTER — Other Ambulatory Visit (HOSPITAL_BASED_OUTPATIENT_CLINIC_OR_DEPARTMENT_OTHER): Payer: Self-pay

## 2024-02-23 NOTE — Progress Notes (Signed)
 Transplant Clinic Annual Patient Visit Wed 02/23/2024  NEPHROLOGIST: [name], MD - [facility] PCP: [name], MD - [facility]  CC: S/P DDRT and immunosuppression management - Annual Exam  Patient summary: Patient summary: [name] is a 59 y.o. 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 The donor was a 1A-1DR antigen match for him. His PRA was 4%.   Clinic Wed 02/23/2024: The patient presents to renal transplant clinic for an annual follow-up visit today. [name] feeling well and has no complaints today. They are following with their primary nephrologist [name], MD at [facility] biannually, previous visit 08/2023 and states having follow up soon. However, patient has not maintained regular follow up. In regard to medications, states he knew of someone with kidney transplant who passed away who had medications leftover. States he took this as he has had difficulties with filling prescriptions. Denies missing any doses and confirms he was taking the correct amount, though previous questionable compliance with primary Nephrologist. Recent admission 07/2023 to [facility] for wornseing shortness of breath.Treated for multifocal pneumonia with azithromycin  and Rocephin . Found evidence of acute HFrEF on 2D echovardiogram. Right hydrocelectomy performed 10/28/23, followed by urology.  Patient endorses adequate hydration of at least 64 oz/day with appropriate urine output. Denies any dysuria, hematuria, frothy urine, or increased frequency/urgency with urination. Weight has been stable without lower extremity edema. Endorses good appetite and regular bowel movements without nausea, vomiting, or diarrhea. Notes intermittent constipation associated with bloating that resolved on its own. Has  considered adding a supplement for this. Denies any fevers, chills, or night sweats.   Blood pressures elevated in clinic today. Current regimen includes: amlodipine  10 mg QD, Coreg  6.25 mg BID, Hydralazine  25 mg TID and Imdur  30 mg daily. Denies chest pain, palpitations, shortness of breath, lightheadedness, dizziness. Notes being able to hear his heartbeat previously but has not since following with Cardiology/PCP. Followed by Cardiology for chronic HFrEF. Most recent EF 35-40% 07/2023. Also on Enalapril  20mg  for this, Cardiology noted that dosing has been deferred to Nephrology and he has not been switched to Entresto. Patient also currently diabetic and takes Tresiba  20 units nightly, Jaradiance 10mg , Metformin 1000mg  BID. Followed by Endocrinology - previously increased to 30 units but continued 20 units as concerned for hypoglycemia. Recent HbA1c: 7.6%. 09/2023.   The patient has not had any reported episodes of cellular or antibody mediated rejection, CMV viremia, urinary tract infections, nephrolithiasis or opportunistic infections. Patient states that he has been adherent to medications and denies issues with obtaining medications going forward although this has been an issue in the past.  Current Immunosuppression:  Prograf : 2mg  daily Cellcept : 1000mg  BID Prednisone  5mg  daily   Health Maintenance: Primary Nephrologist: [employer], q6 months according to patient.  Primary Care: [facility], Suzzane Blanch, MD Dental: UTD, dentures Dermatology: due Colonoscopy: Cologuard 07/2023 - Negative Flu shot: UTD PNA vaccine: UTD  ROS: A complete ROS was performed and negative unless otherwise mentioned in HPI.  Immunologic: positive for immunosuppression  Problem List[1]  Medical History[2]  Family History[3]  Surgical History[4]  Current Rx ordered in [facility][5]   Vitals:   02/23/24 0814 02/23/24 0819  BP: (!) 168/93 (!) 156/91  Pulse: 83 86   Resp: 17   Temp: 98 F (36.7 C)  TempSrc: Temporal   SpO2: 98%   Weight: 74.3 kg (163 lb 12.8 oz)    There is no height or weight on file to calculate BMI. Wt Readings from Last 3 Encounters:  02/23/24 74.3 kg (163 lb 12.8 oz)  04/01/22 69.1 kg (152 lb 4.8 oz)  04/01/21 68.7 kg (151 lb 7.3 oz)    Physical Exam:  General Appearance:  59 y.o. y.o. African American, male, alert and oriented x 3. No acute distress. Presents with alone today.  Head:  Normocephalic, atraumatic.  Eyes:  Conjunctivae clear. No scleral icterus.  Neck: Neck supple, trachea midline. No adenopathy, no thyroid megaly, no overt JVD, no tenderness, no masses.  Throat:  Moist mucous membranes. No thrush or ulcers.  Chest/Lungs:  Respirations unlabored. Clear to auscultation bilaterally, no wheezes, rhonchi or rales.  Heart:  Regular rate and rhythm, no murmur, click, rub or gallop.   Abdomen:  Soft, non-tender, positive bowel sounds throughout. No masses, no organomegaly.    Allograft:  RLQ renal allograft non-tender, no bruits, incision well healed, no signs of infection.  Extremities:  2+ peripheral pulses. No cyanosis or edema. Full ROM.  Skin:  Good skin turgor. No rashes or lesions.    Neurologic:  No focal deficits. No tremor.   Psych:   Judgement and memory intact. Affect appropriate.    Nursing note and vitals reviewed.   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 Checking annual labs today.  Patient has history of lack of follow up with nephrology and medication acquisition secondary to lack of insurance. Previously scheduled follow up with primary Nephrologist 10/2023 but patient did not go. Today, he denies any issues with obtaining medications. Patient states has upcoming appointment he intends to go to. - FK level below goal from endocrine labs, 4.3 seven months ago and 3.4  today. Discussed with Nephrology and creatinine is at his relative baseline as previous creatinine 2.83 six months ago. Determined that questionable adherence, HFrEF diagnosis/chronic HTN, and age of kidney likely contributing to increase in creatinine.     - Nephrology recommending close follow up with primary Nephrologist at least every three months to make dose adjustments as needed. Refills for medications provided today. Monitor and review at next annual visit. Stressed the importance of routine follow up with primary Nephrologist. Lab Results  Component Value Date/Time   CREATININE 3.23 (H) 02/23/2024 09:46 AM   CREATININE 2.14 (H) 04/01/2022 11:42 AM   CREATININE 1.79 (H) 04/01/2021 12:15 PM  Immunosuppression: Continue current regimen of Tacrolimus  2mg  BID, Cellcept  1000mg  daily and Prednisone  5mg  daily. Goal FK level: 4-5. Below goal today, refills provided and recommended close follow up with primary Nephrologist, patient states has upcoming appointment.  ID prophylaxis: Dental PPX as needed.  HTN: Elevated in clinic today prior to taking medications. Current regimen includes: amlodipine  10 mg QD, Coreg  6.25 mg BID, Hydralazine  25 mg TID and Imdur  30 mg daily as well as Enalapril  20mg  - actively managed by Cardiology and PCP. Continue to follow with Cardiology.  HFrEF: Most recent EF 35-40% 07/2023. Regimen as listed above. Additional iecho being scheduled in the near future per Cardiology. Continue to follow with Cardiology.  DM screening: Followed by Endocrinology Tresiba  20 units nightly, Jaradiance 10mg , Metformin 1000mg  BID. Endo previously increased to 30 units but continued 20 units as concerned for hypoglycemia.  Recent HbA1c: 7.6%. 09/2023.  Lab Results  Component Value Date   HGBA1C 10.3 (H) 04/01/2022   HGBA1C  04/01/2022     Comment:     Presence of heterozygote variants in patients' samples do not interfere with accurate measurements of HbA1c using Trinity affinity  boronate chromatography, when no other clinical conditions affecting quality/quantity of HbA and/or quantity of hemoglobin, overall, as well as quality/quantity of RBCs are concurrently present.   Presence of homozygote variants and/or other medical conditions affecting the quality/quantity of HbA (e.g. alpha- and beta-thalassemia) and/or quantity of hemoglobin, overall, as well as quality/quantity of RBCs (e.g. anemia of any cause) trigger inaccurate evaluations of the glycated HbA1c with any analytical method, including Trinity affinity boronate chromatography.  In these circumstances, fructosamine testing for these patients is recommended.  Hb F present at concentrations of 11% or above can interfere with accurate measurements of HbA1c.  In these circumstances, fructosamine testing for these patients is recommended.   Mixed Hyperlipidemia: Will check lipid panel today. Continue Simvastatin  20mg  daily, consider incresaing statin at next PCP visit based on ASCVD risk score.  Lab Results  Component Value Date   CHOL 218 (H) 04/01/2021   TRIG 194 (H) 04/01/2021   HDL 34 (L) 04/01/2021   Electrolytes: Will check CMP today. Continue to monitor. Lab Results  Component Value Date   CO2 22 02/23/2024   BUN 38 (H) 02/23/2024   GLUCOSE 82 02/23/2024   CREATININE 3.23 (H) 02/23/2024   CALCIUM  8.8 02/23/2024   ALBUMIN 3.6 02/23/2024   AST 21 02/23/2024   ALT 14 02/23/2024    Secondary hyperparathyroidism. Followed and managed by primary Nephrologist. Most recent PTH was 277 with a phosphorus of 3.5 and calcium  9.3. Not currently managed with Calcitriol.  Anemia of chronic kidney disease. Stable. Actively managed by primary Nephrologist. Lab Results  Component Value Date/Time   HGB 12.2 (L) 02/23/2024 09:46 AM   HGB 11.7 (L) 04/01/2022 11:42 AM   HGB 12.1 (L) 04/01/2021 12:15 PM   Health Maintenance: Remain up to date on vaccinations, colonoscopy. Remain up to date on skin cancer screening by  a dermatologist.  Follow up: 1 year, continue close follow up with primary Nephrologist at least every three months.  I have personally spent 55 minutes involved in face-to-face and non-face-to-face activities for this patient on the day of the visit.  Professional time spent includes the following activities, in addition to those noted in the documentation: chart review, lab review, patient education and counseling , medication counseling/reconciliation, and updating records in EMR.  Electronically signed by: Posey Liz Silvan, PA-C 02/23/2024 1:16 PM        [1] Patient Active Problem List Diagnosis   Deceased-donor kidney transplant recipient   Immunosuppressed status (CMD)   Essential hypertension   Insulin  dependent type 2 diabetes mellitus    (CMD)   Overweight (BMI 25.0-29.9)   Hyperlipidemia   PTDM (post-transplant diabetes mellitus)    (CMD)   Controlled type 2 diabetes mellitus with chronic kidney disease (CMS/HCC)  [2] Past Medical History: Diagnosis Date   Diabetes mellitus    (CMD)    ESRD (end stage renal disease)    (CMD)    Hypertension    Obesity   [3] No family history on file. [4] Past Surgical History: Procedure Laterality Date   KIDNEY TRANSPLANT     Procedure: KIDNEY TRANSPLANT  [5] Meds Ordered in [facility]  Medication Sig Dispense Refill   amLODIPine  (NORVASC ) 10 mg tablet Take 10 mg by mouth Once Daily.  aspirin  81 mg EC tablet Take 81 mg by mouth Once Daily.     enalapril  (VASOTEC ) 5 mg tablet Take 5 mg by mouth Once Daily.     glimepiride  (AMARYL ) 2 mg tablet Take 2 mg by mouth.     insulin  glargine (Lantus  U-100 Insulin ) 100 unit/mL injection Inject 30 Units under the skin.     insulin  regular (HumuLIN R  Regular U-100 Insuln) 100 unit/mL injection Inject 20 Units under the skin 2 (two) times a day before meals.     metFORMIN (GLUCOPHAGE-XR) 500 mg 24 hr tablet Take 1,000 mg by mouth 2 (two) times a day.     omeprazole  (PriLOSEC) 20 mg DR capsule Take 20 mg by mouth as needed.     simvastatin  (ZOCOR ) 20 mg tablet Take 20 mg by mouth nightly.     mycophenolate  (CellCept ) 250 mg capsule 4 Capsule by mouth two times daily 240 capsule 11   predniSONE  (DELTASONE ) 5 mg tablet Take 1 tablet (5 mg total) by mouth daily. 30 tablet 11   tacrolimus  (PROGRAF ) 1 mg capsule Take 2 capsules by mouth twice daily. 120 capsule 11   No current Epic-ordered facility-administered medications on file.

## 2024-02-24 ENCOUNTER — Ambulatory Visit: Attending: Cardiology | Admitting: Cardiology

## 2024-02-24 ENCOUNTER — Encounter: Payer: Self-pay | Admitting: Cardiology

## 2024-02-24 ENCOUNTER — Other Ambulatory Visit (HOSPITAL_BASED_OUTPATIENT_CLINIC_OR_DEPARTMENT_OTHER): Payer: Self-pay

## 2024-02-24 VITALS — BP 150/80 | HR 82 | Ht 64.0 in | Wt 160.0 lb

## 2024-02-24 DIAGNOSIS — I5022 Chronic systolic (congestive) heart failure: Secondary | ICD-10-CM | POA: Diagnosis not present

## 2024-02-24 DIAGNOSIS — I10 Essential (primary) hypertension: Secondary | ICD-10-CM

## 2024-02-24 MED ORDER — CARVEDILOL 12.5 MG PO TABS
12.5000 mg | ORAL_TABLET | Freq: Two times a day (BID) | ORAL | 3 refills | Status: DC
  Filled 2024-02-24: qty 180, 90d supply, fill #0

## 2024-02-24 NOTE — Progress Notes (Signed)
 "     Clinical Summary [name] is a 59 y.o.male seen today for follow up of the following medical problems.   1.Chronic HFrEF - new diagnosis during 07/2023 admission - -07/2023 echo: LVE 35-40%, Global hypokinesis, worse in the anterior, inferior and septal walls. Grade I dd. Normal RV function  11/2023 echo: LVEF 35% - - medical therapy limited by CKD, transplanted kidney. GFR 30  - - poor cath candiate, transplanted kidney in 2007 with current GFR 30.   - no recent SOB/DOE, no recent edema - compliant with meds   2.History of renal transplant Jan 2007 - followed at [facility] transplant clinic - Cr 2.39 on admission. Over last 6 months varied 2.35-2.9     3. HTN - compliant with meds  SH: works as scientist, clinical (histocompatibility and immunogenetics) Past Medical History:  Diagnosis Date   DDD (degenerative disc disease), lumbar    Diabetes mellitus    Hypertension    Polycystic kidney disease      Allergies[1]   Current Outpatient Medications  Medication Sig Dispense Refill   acetaminophen  (TYLENOL ) 500 MG tablet Take 500 mg by mouth every 8 (eight) hours as needed for moderate pain (pain score 4-6).     amLODipine  (NORVASC ) 10 MG tablet Take 1 tablet (10 mg total) by mouth daily. 90 tablet 3   aspirin  EC 81 MG tablet Take 81 mg by mouth daily.     atorvastatin  (LIPITOR) 40 MG tablet Take 1 tablet (40 mg total) by mouth daily. 90 tablet 3   Blood Glucose Monitoring Suppl (ACCU-CHEK GUIDE) w/Device KIT Use as directed. 1 kit 0   Blood Glucose Monitoring Suppl DEVI 1 each by Does not apply route in the morning, at noon, and at bedtime. May substitute to any manufacturer covered by patient's insurance. 1 each 0   carvedilol  (COREG ) 6.25 MG tablet Take 1 tablet (6.25 mg total) by mouth 2 (two) times daily. 180 tablet 3   Continuous Glucose Receiver (DEXCOM G7 RECEIVER) DEVI Use to monitor blood glucose continuously 1 each 0   Continuous Glucose Sensor (DEXCOM G7 SENSOR) MISC Change  sensor every 10 days 3 each 2   empagliflozin  (JARDIANCE ) 10 MG TABS tablet Take 1 tablet (10 mg total) by mouth daily in the morning. 30 tablet 12   enalapril  (VASOTEC ) 20 MG tablet Take 1 tablet (20 mg total) by mouth daily. 90 tablet 3   furosemide  (LASIX ) 20 MG tablet Take 1 tablet (20 mg total) by mouth daily as needed for fluid or edema. 30 tablet 1   glucose blood (ACCU-CHEK GUIDE TEST) test strip Use to monitor glucose 2 times daily as instructed 100 each 12   hydrALAZINE  (APRESOLINE ) 25 MG tablet Take 1 tablet (25 mg total) by mouth 3 (three) times daily. 90 tablet 2   insulin  degludec (TRESIBA  FLEXTOUCH) 100 UNIT/ML FlexTouch Pen Inject 24 Units into the skin at bedtime. 15 mL 1   insulin  degludec (TRESIBA ) 100 UNIT/ML FlexTouch Pen Inject 30 Units into the skin at bedtime. (Patient taking differently: Inject 30 Units into the skin at bedtime. Taking 20 units.. says 30 units make his BS drop too low.) 15 mL 1   Insulin  Pen Needle 32G X 4 MM MISC Use 4 (four) times daily. 100 each 1   isosorbide  mononitrate (IMDUR ) 30 MG 24 hr tablet Take 1 tablet (30 mg total) by mouth daily. 90 tablet 3   mycophenolate  (CELLCEPT ) 250 MG capsule Take 1,000 mg by mouth 2 (  two) times daily.     oxyCODONE  (OXY IR/ROXICODONE ) 5 MG immediate release tablet Take 1 tablet (5 mg total) by mouth every 6 (six) hours as needed. 120 tablet 0   predniSONE  (DELTASONE ) 5 MG tablet Take 5 mg by mouth daily.     tacrolimus  (PROGRAF ) 1 MG capsule Take 2 mg by mouth 2 (two) times daily.     tetrahydrozoline-zinc (VISINE-AC) 0.05-0.25 % ophthalmic solution Place 2 drops into both eyes 3 (three) times daily as needed. Dry/Red Eyes     Vitamin D , Ergocalciferol , (DRISDOL ) 1.25 MG (50000 UNIT) CAPS capsule Take 1 capsule (50,000 Units total) by mouth every 7 (seven) days. 12 capsule 1   No current facility-administered medications for this visit.     Past Surgical History:  Procedure Laterality Date   HYDROCELE EXCISION  Right 10/28/2023   Procedure: HYDROCELECTOMY;  Surgeon: Sherrilee Belvie CROME, MD;  Location: [facility];  Service: Urology;  Laterality: Right;   NEPHRECTOMY TRANSPLANTED ORGAN       Allergies[2]    Family History  Problem Relation Age of Onset   Diabetes Mother    Kidney disease Mother    Stroke Mother    Kidney disease Father    Hypertension Father    Stroke Father    Heart disease Father      Social History [name] reports that he has never smoked. He has never used smokeless tobacco. [name] reports no history of alcohol use.    Physical Examination Today's Vitals   02/24/24 0825 02/24/24 0840  BP: (!) 156/84 (!) 150/80  Pulse: 82   Weight: 160 lb (72.6 kg)   Height: 5' 4 (1.626 m)    Body mass index is 27.46 kg/m.  Gen: resting comfortably, no acute distress HEENT: no scleral icterus, pupils equal round and reactive, no palptable cervical adenopathy,  CV: RRR, no m/rg no jvd Resp: Clear to auscultation bilaterally GI: abdomen is soft, non-tender, non-distended, normal bowel sounds, no hepatosplenomegaly MSK: extremities are warm, no edema.  Skin: warm, no rash Neuro:  no focal deficits Psych: appropriate affect   Diagnostic Studies   11/2023 limited echo 1. Left ventricular ejection fraction, by estimation, is 35%. The left  ventricle has moderately decreased function. There is mild left  ventricular hypertrophy.   2. Right ventricular systolic function is low normal. The right  ventricular size is normal.   3. IVC is small suggesting low RA pressure and hypovolemia.   4. Limited study to evaluate LV function    Assessment and Plan  1.Chronic HFrEF - euvolemic without symptoms - medical therapy limited by renal dysfunction. We have deferred contineu ACEi and SGLT2i use to neprhology and tranplant nephrology teams - will titrate coreg  to 12.5mg  bid. Work to get to optimal dosing, also room to titrate hydral/imdur  - avoiding MRA due to renal  dysfunction - once optimaize repeat limited echo, if ongoing dysfunction refer to HF clinic  2. HTN - above goal, increase coreg  to 12.5mg  bid  F/u 2 months      Dorn PHEBE Ross, M.D.     [1] No Known Allergies [2] No Known Allergies  "

## 2024-02-24 NOTE — Patient Instructions (Signed)
 Medication Instructions:  Your physician has recommended you make the following change in your medication:   -Increase Carvedilol  (Coreg ) to 12.5 mg twice daily   *If you need a refill on your cardiac medications before your next appointment, please call your pharmacy*  Lab Work: None If you have labs (blood work) drawn today and your tests are completely normal, you will receive your results only by: MyChart Message (if you have MyChart) OR A paper copy in the mail If you have any lab test that is abnormal or we need to change your treatment, we will call you to review the results.  Testing/Procedures: None  Follow-Up: At [facility], you and your health needs are our priority.  As part of our continuing mission to provide you with exceptional heart care, our providers are all part of one team.  This team includes your primary Cardiologist (physician) and Advanced Practice Providers or APPs (Physician Assistants and Nurse Practitioners) who all work together to provide you with the care you need, when you need it.  Your next appointment:   2 month(s)  Provider:   You may see Alvan Carrier, MD or one of the following Advanced Practice Providers on your designated Care Team:   Laymon Qua, PA-C  Scotesia Mitchell, [place] Olivia Pavy, [place]     We recommend signing up for the patient portal called MyChart.  Sign up information is provided on this After Visit Summary.  MyChart is used to connect with patients for Virtual Visits (Telemedicine).  Patients are able to view lab/test results, encounter notes, upcoming appointments, etc.  Non-urgent messages can be sent to your provider as well.   To learn more about what you can do with MyChart, go to [email or web address].   Other Instructions Nurse Visit- Blood Pressure Check- 2 weeks.

## 2024-02-25 ENCOUNTER — Other Ambulatory Visit (HOSPITAL_BASED_OUTPATIENT_CLINIC_OR_DEPARTMENT_OTHER): Payer: Self-pay

## 2024-03-07 ENCOUNTER — Telehealth: Payer: Self-pay

## 2024-03-07 ENCOUNTER — Other Ambulatory Visit (HOSPITAL_BASED_OUTPATIENT_CLINIC_OR_DEPARTMENT_OTHER): Payer: Self-pay

## 2024-03-07 DIAGNOSIS — M51362 Other intervertebral disc degeneration, lumbar region with discogenic back pain and lower extremity pain: Secondary | ICD-10-CM

## 2024-03-07 DIAGNOSIS — G894 Chronic pain syndrome: Secondary | ICD-10-CM

## 2024-03-07 MED ORDER — OXYCODONE HCL 5 MG PO TABS
5.0000 mg | ORAL_TABLET | Freq: Four times a day (QID) | ORAL | 0 refills | Status: AC | PRN
  Filled 2024-03-07: qty 120, 30d supply, fill #0

## 2024-03-07 NOTE — Telephone Encounter (Signed)
Refilled x 1 month only

## 2024-03-07 NOTE — Addendum Note (Signed)
 Addended by: ANTONETTA ROLLENE BRAVO on: 03/07/2024 03:05 PM   Modules accepted: Orders

## 2024-03-07 NOTE — Telephone Encounter (Signed)
 Copied from CRM [id number]. Topic: Clinical - Medication Refill >> Mar 07, 2024  2:38 PM Antwanette L wrote: Medication: oxyCODONE  (OXY IR/ROXICODONE ) 5 MG immediate release tablet  Has the patient contacted their pharmacy? No  This is the patient's preferred pharmacy:  [facility] - [place], [place] - [address] [place] [address] Phone: [phone] Fax: [phone]  Is this the correct pharmacy for this prescription? Yes   Has the prescription been filled recently? Yes. Last refill was 02/07/24  Is the patient out of the medication? No. Patient has 2-3 days worth of medicine  Has the patient been seen for an appointment in the last year OR does the patient have an upcoming appointment? Yes. Last ov with Dr. Tobie was 01/21/24 and next appt is 05/23/24  Can we respond through MyChart? No. Patient can be reached at [phone]  Agent: Please be advised that Rx refills may take up to 3 business days. We ask that you follow-up with your pharmacy.

## 2024-03-09 ENCOUNTER — Other Ambulatory Visit (HOSPITAL_BASED_OUTPATIENT_CLINIC_OR_DEPARTMENT_OTHER): Payer: Self-pay

## 2024-03-10 ENCOUNTER — Ambulatory Visit

## 2024-03-14 ENCOUNTER — Ambulatory Visit

## 2024-03-16 ENCOUNTER — Ambulatory Visit

## 2024-03-16 ENCOUNTER — Other Ambulatory Visit: Payer: Self-pay

## 2024-03-16 ENCOUNTER — Other Ambulatory Visit (HOSPITAL_BASED_OUTPATIENT_CLINIC_OR_DEPARTMENT_OTHER): Payer: Self-pay

## 2024-03-16 ENCOUNTER — Ambulatory Visit: Attending: Cardiology

## 2024-03-16 VITALS — BP 146/86

## 2024-03-16 DIAGNOSIS — I10 Essential (primary) hypertension: Secondary | ICD-10-CM

## 2024-03-16 MED ORDER — CARVEDILOL 25 MG PO TABS
25.0000 mg | ORAL_TABLET | Freq: Two times a day (BID) | ORAL | 3 refills | Status: AC
  Filled 2024-03-16: qty 180, 90d supply, fill #0

## 2024-03-16 NOTE — Patient Instructions (Addendum)
 Medication Instructions:  Your physician has recommended you make the following change in your medication:   -Increase Coreg  to 25 mg twice daily   *If you need a refill on your cardiac medications before your next appointment, please call your pharmacy*  Lab Work: None If you have labs (blood work) drawn today and your tests are completely normal, you will receive your results only by: MyChart Message (if you have MyChart) OR A paper copy in the mail If you have any lab test that is abnormal or we need to change your treatment, we will call you to review the results.  Testing/Procedures: None  Follow-Up: At [facility], you and your health needs are our priority.  As part of our continuing mission to provide you with exceptional heart care, our providers are all part of one team.  This team includes your primary Cardiologist (physician) and Advanced Practice Providers or APPs (Physician Assistants and Nurse Practitioners) who all work together to provide you with the care you need, when you need it.  Your next appointment:   1 week nurse visit   Provider:   Nursing Staff  We recommend signing up for the patient portal called MyChart.  Sign up information is provided on this After Visit Summary.  MyChart is used to connect with patients for Virtual Visits (Telemedicine).  Patients are able to view lab/test results, encounter notes, upcoming appointments, etc.  Non-urgent messages can be sent to your provider as well.   To learn more about what you can do with MyChart, go to [email or web address].   Other Instructions Thank you for choosing [facility]!

## 2024-03-16 NOTE — Addendum Note (Signed)
 Addended by: KENETH ROSINA BROCKS on: 03/16/2024 08:51 AM   Modules accepted: Orders

## 2024-03-16 NOTE — Progress Notes (Signed)
 Patient presents today for nurse visit- BP check.   At last ov with provider:  -will titrate coreg  to 12.5mg  bid. Work to get to optimal dosing, also room to titrate hydral/imdur    Pt's bp in office today: 146/86  Pt stated he has not been checking bp at home d/t dead cuff battery.   Pt stated that since starting increased dose of medication he has felt about the same.

## 2024-03-17 ENCOUNTER — Other Ambulatory Visit (HOSPITAL_BASED_OUTPATIENT_CLINIC_OR_DEPARTMENT_OTHER): Payer: Self-pay

## 2024-03-21 ENCOUNTER — Ambulatory Visit: Admitting: "Endocrinology

## 2024-03-23 ENCOUNTER — Ambulatory Visit

## 2024-03-27 ENCOUNTER — Ambulatory Visit: Admitting: "Endocrinology

## 2024-04-26 ENCOUNTER — Ambulatory Visit: Admitting: Physician Assistant

## 2024-05-23 ENCOUNTER — Ambulatory Visit: Admitting: Internal Medicine
